# Patient Record
Sex: Female | Born: 1935
Health system: Southern US, Community
[De-identification: ages and names within clinical notes are randomized; demographics above are authoritative.]

## PROBLEM LIST (undated history)

## (undated) DIAGNOSIS — T7840XA Allergy, unspecified, initial encounter: Secondary | ICD-10-CM

## (undated) DIAGNOSIS — H9190 Unspecified hearing loss, unspecified ear: Secondary | ICD-10-CM

## (undated) DIAGNOSIS — C4491 Basal cell carcinoma of skin, unspecified: Secondary | ICD-10-CM

## (undated) DIAGNOSIS — H40009 Preglaucoma, unspecified, unspecified eye: Secondary | ICD-10-CM

## (undated) DIAGNOSIS — K219 Gastro-esophageal reflux disease without esophagitis: Secondary | ICD-10-CM

## (undated) DIAGNOSIS — M1990 Unspecified osteoarthritis, unspecified site: Secondary | ICD-10-CM

## (undated) HISTORY — DX: Preglaucoma, unspecified, unspecified eye: H40.009

## (undated) HISTORY — DX: Basal cell carcinoma of skin, unspecified: C44.91

## (undated) HISTORY — DX: Allergy, unspecified, initial encounter: T78.40XA

## (undated) HISTORY — DX: Gastro-esophageal reflux disease without esophagitis: K21.9

## (undated) HISTORY — PX: BREAST EXCISIONAL BIOPSY: SUR124

## (undated) HISTORY — PX: TUBAL LIGATION: SHX77

## (undated) HISTORY — PX: ABDOMINAL HYSTERECTOMY: SHX81

---

## 1972-07-24 HISTORY — PX: INNER EAR SURGERY: SHX679

## 1999-12-27 ENCOUNTER — Other Ambulatory Visit: Admission: RE | Admit: 1999-12-27 | Discharge: 1999-12-27 | Payer: Self-pay | Admitting: Internal Medicine

## 2005-07-24 HISTORY — PX: OTHER SURGICAL HISTORY: SHX169

## 2006-03-27 ENCOUNTER — Ambulatory Visit: Payer: Self-pay | Admitting: Internal Medicine

## 2006-04-02 ENCOUNTER — Ambulatory Visit: Payer: Self-pay | Admitting: Internal Medicine

## 2006-04-24 ENCOUNTER — Ambulatory Visit: Payer: Self-pay | Admitting: Internal Medicine

## 2006-05-10 ENCOUNTER — Ambulatory Visit: Payer: Self-pay | Admitting: Internal Medicine

## 2006-09-18 ENCOUNTER — Encounter: Admission: RE | Admit: 2006-09-18 | Discharge: 2006-09-18 | Payer: Self-pay | Admitting: Internal Medicine

## 2007-03-22 DIAGNOSIS — M810 Age-related osteoporosis without current pathological fracture: Secondary | ICD-10-CM

## 2007-03-22 DIAGNOSIS — K219 Gastro-esophageal reflux disease without esophagitis: Secondary | ICD-10-CM

## 2007-03-22 DIAGNOSIS — J309 Allergic rhinitis, unspecified: Secondary | ICD-10-CM

## 2007-04-10 ENCOUNTER — Ambulatory Visit: Payer: Self-pay | Admitting: Internal Medicine

## 2007-04-10 LAB — CONVERTED CEMR LAB
ALT: 20 units/L (ref 0–35)
AST: 28 units/L (ref 0–37)
Albumin: 4.2 g/dL (ref 3.5–5.2)
Alkaline Phosphatase: 55 units/L (ref 39–117)
BUN: 12 mg/dL (ref 6–23)
Basophils Absolute: 0 10*3/uL (ref 0.0–0.1)
Basophils Relative: 0.7 % (ref 0.0–1.0)
Bilirubin Urine: NEGATIVE
Bilirubin, Direct: 0.2 mg/dL (ref 0.0–0.3)
CO2: 32 meq/L (ref 19–32)
Calcium: 10 mg/dL (ref 8.4–10.5)
Chloride: 108 meq/L (ref 96–112)
Cholesterol: 233 mg/dL (ref 0–200)
Creatinine, Ser: 0.7 mg/dL (ref 0.4–1.2)
Direct LDL: 142.3 mg/dL
Eosinophils Absolute: 0 10*3/uL (ref 0.0–0.6)
Eosinophils Relative: 0.7 % (ref 0.0–5.0)
GFR calc Af Amer: 107 mL/min
GFR calc non Af Amer: 88 mL/min
Glucose, Bld: 98 mg/dL (ref 70–99)
Glucose, Urine, Semiquant: NEGATIVE
HCT: 38.8 % (ref 36.0–46.0)
HDL: 61.7 mg/dL (ref >39.0)
Hemoglobin: 13.6 g/dL (ref 12.0–15.0)
Ketones, urine, test strip: NEGATIVE
Lymphocytes Relative: 41.7 % (ref 12.0–46.0)
MCHC: 35 g/dL (ref 30.0–36.0)
MCV: 93.7 fL (ref 78.0–100.0)
Monocytes Absolute: 0.3 10*3/uL (ref 0.2–0.7)
Monocytes Relative: 7.1 % (ref 3.0–11.0)
Neutro Abs: 2.6 10*3/uL (ref 1.4–7.7)
Neutrophils Relative %: 49.8 % (ref 43.0–77.0)
Nitrite: NEGATIVE
Platelets: 211 10*3/uL (ref 150–400)
Potassium: 5.1 meq/L (ref 3.5–5.1)
Protein, U semiquant: NEGATIVE
RBC: 4.14 M/uL (ref 3.87–5.11)
RDW: 12.5 % (ref 11.5–14.6)
Sodium: 145 meq/L (ref 135–145)
Specific Gravity, Urine: 1.02
TSH: 3.69 microintl units/mL (ref 0.35–5.50)
Total Bilirubin: 1 mg/dL (ref 0.3–1.2)
Total CHOL/HDL Ratio: 3.8
Total Protein: 6.9 g/dL (ref 6.0–8.3)
Triglycerides: 96 mg/dL (ref 0–149)
Urobilinogen, UA: NEGATIVE
VLDL: 19 mg/dL (ref 0–40)
WBC Urine, dipstick: NEGATIVE
WBC: 4.9 10*3/uL (ref 4.5–10.5)
pH: 6.5

## 2007-04-17 ENCOUNTER — Other Ambulatory Visit: Admission: RE | Admit: 2007-04-17 | Discharge: 2007-04-17 | Payer: Self-pay | Admitting: Internal Medicine

## 2007-04-17 ENCOUNTER — Encounter: Payer: Self-pay | Admitting: Internal Medicine

## 2007-04-17 ENCOUNTER — Ambulatory Visit: Payer: Self-pay | Admitting: Internal Medicine

## 2007-04-17 DIAGNOSIS — L259 Unspecified contact dermatitis, unspecified cause: Secondary | ICD-10-CM

## 2007-05-03 ENCOUNTER — Encounter: Payer: Self-pay | Admitting: Internal Medicine

## 2007-09-25 ENCOUNTER — Encounter: Admission: RE | Admit: 2007-09-25 | Discharge: 2007-09-25 | Payer: Self-pay | Admitting: Internal Medicine

## 2008-04-16 ENCOUNTER — Encounter: Payer: Self-pay | Admitting: Internal Medicine

## 2008-04-17 ENCOUNTER — Ambulatory Visit: Payer: Self-pay | Admitting: Internal Medicine

## 2008-04-17 LAB — CONVERTED CEMR LAB
Glucose, Urine, Semiquant: NEGATIVE
Ketones, urine, test strip: NEGATIVE
Nitrite: NEGATIVE
Specific Gravity, Urine: 1.02
pH: 7

## 2008-04-22 LAB — CONVERTED CEMR LAB
Albumin: 4 g/dL (ref 3.5–5.2)
BUN: 17 mg/dL (ref 6–23)
Basophils Relative: 0.1 % (ref 0.0–3.0)
Creatinine, Ser: 0.7 mg/dL (ref 0.4–1.2)
Direct LDL: 151 mg/dL
Eosinophils Absolute: 0 10*3/uL (ref 0.0–0.7)
Eosinophils Relative: 1 % (ref 0.0–5.0)
GFR calc Af Amer: 107 mL/min
GFR calc non Af Amer: 88 mL/min
HCT: 37.6 % (ref 36.0–46.0)
MCHC: 34.5 g/dL (ref 30.0–36.0)
MCV: 93.9 fL (ref 78.0–100.0)
Monocytes Absolute: 0.4 10*3/uL (ref 0.1–1.0)
Platelets: 223 10*3/uL (ref 150–400)
RBC: 4 M/uL (ref 3.87–5.11)
TSH: 2.25 microintl units/mL (ref 0.35–5.50)
Triglycerides: 69 mg/dL (ref 0–149)
WBC: 4.1 10*3/uL — ABNORMAL LOW (ref 4.5–10.5)

## 2008-09-29 ENCOUNTER — Encounter: Admission: RE | Admit: 2008-09-29 | Discharge: 2008-09-29 | Payer: Self-pay | Admitting: Internal Medicine

## 2008-10-31 ENCOUNTER — Emergency Department (HOSPITAL_BASED_OUTPATIENT_CLINIC_OR_DEPARTMENT_OTHER): Admission: EM | Admit: 2008-10-31 | Discharge: 2008-11-01 | Payer: Self-pay | Admitting: Emergency Medicine

## 2009-03-08 ENCOUNTER — Ambulatory Visit: Payer: Self-pay | Admitting: Internal Medicine

## 2009-03-08 ENCOUNTER — Telehealth (INDEPENDENT_AMBULATORY_CARE_PROVIDER_SITE_OTHER): Payer: Self-pay | Admitting: *Deleted

## 2009-04-19 ENCOUNTER — Ambulatory Visit: Payer: Self-pay | Admitting: Internal Medicine

## 2009-04-19 LAB — CONVERTED CEMR LAB
Albumin: 3.8 g/dL (ref 3.5–5.2)
Alkaline Phosphatase: 48 units/L (ref 39–117)
Basophils Absolute: 0 10*3/uL (ref 0.0–0.1)
CO2: 29 meq/L (ref 19–32)
Calcium: 9.1 mg/dL (ref 8.4–10.5)
Cholesterol: 233 mg/dL — ABNORMAL HIGH (ref 0–200)
Creatinine, Ser: 0.6 mg/dL (ref 0.4–1.2)
Direct LDL: 155.5 mg/dL
Eosinophils Absolute: 0 10*3/uL (ref 0.0–0.7)
Glucose, Bld: 93 mg/dL (ref 70–99)
Hemoglobin: 12.9 g/dL (ref 12.0–15.0)
Lymphocytes Relative: 30.7 % (ref 12.0–46.0)
MCHC: 34.7 g/dL (ref 30.0–36.0)
Neutro Abs: 2.7 10*3/uL (ref 1.4–7.7)
RDW: 12.8 % (ref 11.5–14.6)
Sodium: 142 meq/L (ref 135–145)
TSH: 1.6 microintl units/mL (ref 0.35–5.50)
Triglycerides: 83 mg/dL (ref 0.0–149.0)

## 2009-04-20 LAB — CONVERTED CEMR LAB: Vit D, 25-Hydroxy: 34 ng/mL (ref 30–89)

## 2009-05-27 ENCOUNTER — Telehealth: Payer: Self-pay | Admitting: Internal Medicine

## 2009-06-08 ENCOUNTER — Telehealth: Payer: Self-pay | Admitting: Internal Medicine

## 2009-09-30 ENCOUNTER — Encounter: Admission: RE | Admit: 2009-09-30 | Discharge: 2009-09-30 | Payer: Self-pay | Admitting: Internal Medicine

## 2009-09-30 LAB — HM MAMMOGRAPHY: HM Mammogram: NORMAL

## 2010-04-20 ENCOUNTER — Ambulatory Visit: Payer: Self-pay | Admitting: Internal Medicine

## 2010-04-20 ENCOUNTER — Encounter: Payer: Self-pay | Admitting: Internal Medicine

## 2010-04-20 LAB — CONVERTED CEMR LAB
BUN: 17 mg/dL (ref 6–23)
Bilirubin, Direct: 0.1 mg/dL (ref 0.0–0.3)
CO2: 30 meq/L (ref 19–32)
Chloride: 103 meq/L (ref 96–112)
Cholesterol: 255 mg/dL — ABNORMAL HIGH (ref 0–200)
Creatinine, Ser: 0.7 mg/dL (ref 0.4–1.2)
Direct LDL: 165.7 mg/dL
Eosinophils Absolute: 0 10*3/uL (ref 0.0–0.7)
Glucose, Bld: 95 mg/dL (ref 70–99)
Lymphs Abs: 1.6 10*3/uL (ref 0.7–4.0)
MCHC: 34.2 g/dL (ref 30.0–36.0)
MCV: 93.5 fL (ref 78.0–100.0)
Monocytes Absolute: 0.3 10*3/uL (ref 0.1–1.0)
Neutrophils Relative %: 62.2 % (ref 43.0–77.0)
Platelets: 211 10*3/uL (ref 150.0–400.0)
RDW: 13.5 % (ref 11.5–14.6)
TSH: 2.04 microintl units/mL (ref 0.35–5.50)
Total Bilirubin: 0.9 mg/dL (ref 0.3–1.2)
Total Protein: 7.3 g/dL (ref 6.0–8.3)
VLDL: 21.2 mg/dL (ref 0.0–40.0)

## 2010-06-30 ENCOUNTER — Ambulatory Visit: Payer: Self-pay | Admitting: Internal Medicine

## 2010-06-30 DIAGNOSIS — M549 Dorsalgia, unspecified: Secondary | ICD-10-CM

## 2010-08-13 ENCOUNTER — Other Ambulatory Visit: Payer: Self-pay | Admitting: Internal Medicine

## 2010-08-13 DIAGNOSIS — Z1239 Encounter for other screening for malignant neoplasm of breast: Secondary | ICD-10-CM

## 2010-08-25 NOTE — Assessment & Plan Note (Signed)
Summary: pulled muscle in back/cjr   Vital Signs:  Patient profile:   75 year old female Weight:      121 pounds Temp:     98.2 degrees F oral BP sitting:   118 / 74  (left arm) Cuff size:   regular  Vitals Entered By: Duard Brady LPN (June 30, 2010 10:31 AM) heCC: c/o ??pulled muscle in (R) lower back Is Patient Diabetic? No   CC:  c/o ??pulled muscle in (R) lower back.  History of Present Illness:   75 year old patient who presents with a several week history of intermittent right lumbar pain.  Her the week.  She had much more severe pain, associated with the intense muscle spasm.  She has been using Aleve and her husband's muscle relaxant.  No radicular component.  She does have a history of osteoporosis.  No history of arthritis.  She has been doing more work with the [employer] with more bending and stooping of late  Allergies: 1)  ! Penicillin G Pot in Dextrose (Penicillin G Potassium in D5w) 2)  ! Sulf-10  Physical Exam  General:  Well-developed,well-nourished,in no acute distress; alert,appropriate and cooperative throughout examination Msk:  negative straight leg test no tenderness or obvious spasm in the lumbar region range of motion of the hips were intact without discomfort   Impression & Recommendations:  Problem # 1:  BACK PAIN (ICD-724.5)  Her updated medication list for this problem includes:    Methocarbamol 500 Mg Tabs (Methocarbamol) ..... One every 6 hours as needed for muscle spasm  Her updated medication list for this problem includes:    Methocarbamol 500 Mg Tabs (Methocarbamol) ..... One every 6 hours as needed for muscle spasm  Complete Medication List: 1)  Boniva 150 Mg Tabs (Ibandronate sodium) .Marland Kitchen.. 1 q month 2)  Ambien 5 Mg Tabs (Zolpidem tartrate) .Marland Kitchen.. 1 as needed 3)  Omeprazole 20 Mg Cpdr (Omeprazole) .Marland Kitchen.. 1 once daily 4)  Fexofenadine Hcl 180 Mg Tabs (Fexofenadine hcl) .Marland Kitchen.. 1 once daily as needed 5)  Centrum Silver  Tabs (Multiple vitamins-minerals) .... Qd 6)  Calcium-vitamin D 600-200 Mg-unit Tabs (Calcium-vitamin d) .... Qd 7)  Fish Oil 1000 Mg Caps (Omega-3 fatty acids) .... Qd 8)  Vitamin D 1000 Unit Tabs (Cholecalciferol) .... Qd 9)  Methocarbamol 500 Mg Tabs (Methocarbamol) .... One every 6 hours as needed for muscle spasm  Patient Instructions: 1)  Most patients (90%) with low back pain will improve with time (2-6 weeks). Keep active but avoid activities that are painful. Apply moist heat  to lower back several times a day. 2)  Take 650-1000mg  of Tylenol every 4-6 hours as needed for relief of pain or comfort of fever AVOID taking more than 3000mg   in a 24 hour period (can cause liver damage in higher doses). Prescriptions: METHOCARBAMOL 500 MG TABS (METHOCARBAMOL) one every 6 hours as needed for muscle spasm  #50 x 1   Entered and Authorized by:   Gordy Savers  MD   Signed by:   Gordy Savers  MD on 06/30/2010   Method used:   Print then Give to Patient   RxID:   [id number]    Orders Added: 1)  Est. Patient Level III [57846]  Appended Document: pulled muscle in back/cjr     Allergies: 1)  ! Penicillin G Pot in Dextrose (Penicillin G Potassium in D5w) 2)  ! Sulf-10   Complete Medication List: 1)  Boniva 150 Mg Tabs (Ibandronate sodium) .Marland KitchenMarland KitchenMarland Kitchen  1 q month 2)  Ambien 5 Mg Tabs (Zolpidem tartrate) .Marland Kitchen.. 1 as needed 3)  Omeprazole 20 Mg Cpdr (Omeprazole) .Marland Kitchen.. 1 once daily 4)  Fexofenadine Hcl 180 Mg Tabs (Fexofenadine hcl) .Marland Kitchen.. 1 once daily as needed 5)  Centrum Silver Tabs (Multiple vitamins-minerals) .... Qd 6)  Calcium-vitamin D 600-200 Mg-unit Tabs (Calcium-vitamin d) .... Qd 7)  Fish Oil 1000 Mg Caps (Omega-3 fatty acids) .... Qd 8)  Vitamin D 1000 Unit Tabs (Cholecalciferol) .... Qd 9)  Methocarbamol 500 Mg Tabs (Methocarbamol) .... One every 6 hours as needed for muscle spasm  Other Orders: Depo- Medrol 80mg  (J1040) Admin of Therapeutic Inj  intramuscular or  subcutaneous (56213)   Medication Administration  Injection # 1:    Medication: Depo- Medrol 80mg     Diagnosis: BACK PAIN (ICD-724.5)    Route: IM    Site: RUOQ gluteus    Exp Date: 10/2010    Lot #: [id number]    Mfr: Pharmacia    Patient tolerated injection without complications    Given by: Duard Brady LPN (June 30, 2010 12:56 PM)  Orders Added: 1)  Depo- Medrol 80mg  [J1040] 2)  Admin of Therapeutic Inj  intramuscular or subcutaneous [08657]

## 2010-08-25 NOTE — Assessment & Plan Note (Signed)
Summary: EMP-WILL FAST//CCM   Vital Signs:  Patient profile:   75 year old female Height:      63 inches Weight:      118 pounds BMI:     20.98 Temp:     98.0 degrees F oral BP sitting:   110 / 74  (right arm) Cuff size:   regular  Vitals Entered By: Duard Brady LPN (April 20, 2010 8:31 AM) CC: cpx - doing well  Is Patient Diabetic? No   CC:  cpx - doing well .  History of Present Illness: 75 year old patient who is seen today for a comprehensive evaluation.  She has a history of osteoporosis, gastroesophageal reflux disease, and allergic rhinitis  Here for Medicare AWV:  1.   Risk factors based on Past M, S, F history:  no cardiovascular risk factors 2.   Physical Activities: remains quite active physically, including a health club 3 times per week 3.   Depression/mood: history depression, or mood disorder 4.   Hearing: no hearing deficits 5.   ADL's: independent in all aspects of daily living 6.   Fall Risk: low 7.   Home Safety: no problems identified 8.   Height, weight, &visual acuity:height and weight stable.  No difficulty with visual acuity 9               Counseling- heart healthy diet regular.  Exercise regimen encouraged as well as calcium and vitamin D supplementation 10.   Labs ordered based on risk factors: complete laboratory profile including lipid panel will be reviewed 11.           Referral Coordination- will have mammogram performed in the spring 12.           Care Plan-  continue heart healthy diet regular.  Exercise.  Plan 13.            Cognitive Assessment- alert and oriented, with normal affect.  No memory disturbance, and Loz[place], executive functioning without difficulty   Allergies: 1)  ! Penicillin G Pot in Dextrose (Penicillin G Potassium in D5w) 2)  ! Sulf-10  Past History:  Past Medical History: Reviewed history from 03/22/2007 and no changes required. Allergic rhinitis GERD Osteoporosis  Past Surgical History: Reviewed  history from 04/19/2009 and no changes required. Hysterectomy Tubal ligation colonoscopy  2007  Family History: Reviewed history from 04/19/2009 and no changes required. both parents died at age 56.  Father from  sepsis.   Mother of complications of heart failure one brother one sister both have hypertension.   Sister on chronic dialysis, SLE, CAD, Asthma  Social History: Reviewed history from 04/19/2009 and no changes required. Married Never Smoked Regular exercise-yes  Review of Systems  The patient denies anorexia, fever, weight loss, weight gain, vision loss, decreased hearing, hoarseness, chest pain, syncope, dyspnea on exertion, peripheral edema, prolonged cough, headaches, hemoptysis, abdominal pain, melena, hematochezia, severe indigestion/heartburn, hematuria, incontinence, genital sores, muscle weakness, suspicious skin lesions, transient blindness, difficulty walking, depression, unusual weight change, abnormal bleeding, enlarged lymph nodes, angioedema, and breast masses.    Physical Exam  General:  Well-developed,well-nourished,in no acute distress; alert,appropriate and cooperative throughout examination Head:  Normocephalic and atraumatic without obvious abnormalities. No apparent alopecia or balding. Eyes:  No corneal or conjunctival inflammation noted. EOMI. Perrla. Funduscopic exam benign, without hemorrhages, exudates or papilledema. Vision grossly normal. Ears:  External ear exam shows no significant lesions or deformities.  Otoscopic examination reveals clear canals, tympanic membranes are intact bilaterally without bulging, retraction,  inflammation or discharge. Hearing is grossly normal bilaterally. Nose:  External nasal examination shows no deformity or inflammation. Nasal mucosa are pink and moist without lesions or exudates. Mouth:  Oral mucosa and oropharynx without lesions or exudates.  Teeth in good repair. Neck:  No deformities, masses, or tenderness  noted. Chest Wall:  No deformities, masses, or tenderness noted. Breasts:  No mass, nodules, thickening, tenderness, bulging, retraction, inflamation, nipple discharge or skin changes noted.   Lungs:  Normal respiratory effort, chest expands symmetrically. Lungs are clear to auscultation, no crackles or wheezes. Heart:  Normal rate and regular rhythm. S1 and S2 normal without gallop, murmur, click, rub or other extra sounds. Abdomen:  Bowel sounds positive,abdomen soft and non-tender without masses, organomegaly or hernias noted. Rectal:  No external abnormalities noted. Normal sphincter tone. No rectal masses or tenderness. Genitalia:  Normal introitus for age, no external lesions, no vaginal discharge, mucosa pink and moist, no vaginal or cervical lesions, no vaginal atrophy, no friaility or hemorrhage, normal uterus size and position, no adnexal masses or tenderness Msk:  No deformity or scoliosis noted of thoracic or lumbar spine.   Pulses:  R and L carotid,radial,femoral,dorsalis pedis and posterior tibial pulses are full and equal bilaterally Extremities:  No clubbing, cyanosis, edema, or deformity noted with normal full range of motion of all joints.   Neurologic:  No cranial nerve deficits noted. Station and gait are normal. Plantar reflexes are down-going bilaterally. DTRs are symmetrical throughout. Sensory, motor and coordinative functions appear intact. Skin:  Intact without suspicious lesions or rashes Cervical Nodes:  No lymphadenopathy noted Axillary Nodes:  No palpable lymphadenopathy Inguinal Nodes:  No significant adenopathy Psych:  Cognition and judgment appear intact. Alert and cooperative with normal attention span and concentration. No apparent delusions, illusions, hallucinations   Impression & Recommendations:  Problem # 1:  PREVENTIVE HEALTH CARE (ICD-V70.0)  Orders: EKG w/ Interpretation (93000) Medicare -1st Annual Wellness Visit [phone]) Venipuncture  (60454) TLB-Lipid Panel (80061-LIPID) TLB-BMP (Basic Metabolic Panel-BMET) (80048-METABOL) TLB-CBC Platelet - w/Differential (85025-CBCD) TLB-Hepatic/Liver Function Pnl (80076-HEPATIC) TLB-TSH (Thyroid Stimulating Hormone) (84443-TSH) Specimen Handling (09811)  Problem # 2:  OSTEOPOROSIS (ICD-733.00)  Her updated medication list for this problem includes:    Boniva 150 Mg Tabs (Ibandronate sodium) .Marland Kitchen... 1 q month  Orders: Venipuncture (91478) TLB-Lipid Panel (80061-LIPID) TLB-BMP (Basic Metabolic Panel-BMET) (80048-METABOL) TLB-CBC Platelet - w/Differential (85025-CBCD) TLB-Hepatic/Liver Function Pnl (80076-HEPATIC) TLB-TSH (Thyroid Stimulating Hormone) (84443-TSH)  Problem # 3:  GERD (ICD-530.81)  Her updated medication list for this problem includes:    Omeprazole 20 Mg Cpdr (Omeprazole) .Marland Kitchen... 1 once daily  Orders: Venipuncture (29562) TLB-Lipid Panel (80061-LIPID) TLB-BMP (Basic Metabolic Panel-BMET) (80048-METABOL) TLB-CBC Platelet - w/Differential (85025-CBCD) TLB-Hepatic/Liver Function Pnl (80076-HEPATIC) TLB-TSH (Thyroid Stimulating Hormone) (84443-TSH)  Complete Medication List: 1)  Boniva 150 Mg Tabs (Ibandronate sodium) .Marland Kitchen.. 1 q month 2)  Ambien 5 Mg Tabs (Zolpidem tartrate) .Marland Kitchen.. 1 as needed 3)  Omeprazole 20 Mg Cpdr (Omeprazole) .Marland Kitchen.. 1 once daily 4)  Fexofenadine Hcl 180 Mg Tabs (Fexofenadine hcl) .Marland Kitchen.. 1 once daily as needed 5)  Centrum Silver Tabs (Multiple vitamins-minerals) .... Qd 6)  Calcium-vitamin D 600-200 Mg-unit Tabs (Calcium-vitamin d) .... Qd 7)  Fish Oil 1000 Mg Caps (Omega-3 fatty acids) .... Qd 8)  Vitamin D 1000 Unit Tabs (Cholecalciferol) .... Qd  Patient Instructions: 1)  Please schedule a follow-up appointment in 1 year. 2)  Limit your Sodium (Salt). 3)  It is important that you exercise regularly at least 20 minutes 5 times  a week. If you develop chest pain, have severe difficulty breathing, or feel very tired , stop exercising immediately  and seek medical attention. 4)  Schedule your mammogram. 5)  Take calcium +Vitamin D daily. Prescriptions: AMBIEN 5 MG  TABS (ZOLPIDEM TARTRATE) 1 as needed  #90 x 4   Entered and Authorized by:   Gordy Savers  MD   Signed by:   Gordy Savers  MD on 04/20/2010   Method used:   Print then Give to Patient   RxID:   [id number] FEXOFENADINE HCL 180 MG TABS (FEXOFENADINE HCL) 1 once daily as needed  #90 x 6   Entered and Authorized by:   Gordy Savers  MD   Signed by:   Gordy Savers  MD on 04/20/2010   Method used:   [facility]        [facility] [phone]* (retail)       [address]       [place], [place]  [address]       Ph: [phone]       Fax: [phone]   RxID:   [id number] OMEPRAZOLE 20 MG CPDR (OMEPRAZOLE) 1 once daily  #90 Capsule x 3   Entered and Authorized by:   Gordy Savers  MD   Signed by:   Gordy Savers  MD on 04/20/2010   Method used:   [facility] to        [facility] [phone][facility] (retail)       [address]       [place], [place]  [address]       Ph: [phone]       Fax: [phone]   RxID:   [id number] BONIVA 150 MG  TABS (IBANDRONATE SODIUM) 1 q month  #3 x 6   Entered and Authorized by:   Gordy Savers  MD   Signed by:   Gordy Savers  MD on 04/20/2010   Method used:   [facility]ronically to        [facility] [phone]* (retail)       [address]       [place], [place]  [address]       Ph: [phone]       Fax: [phone]   RxID:   [id number]    Immunization History:  Influenza Immunization History:    Influenza:  Historical (04/05/2010)  Pneumovax Immunization History:    Pneumovax:  Pneumovax (04/19/2009)

## 2010-10-06 ENCOUNTER — Ambulatory Visit
Admission: RE | Admit: 2010-10-06 | Discharge: 2010-10-06 | Disposition: A | Discharge status: Home / Self Care | Payer: Medicare Other | Source: Ambulatory Visit | Attending: Internal Medicine | Admitting: Internal Medicine

## 2010-10-06 DIAGNOSIS — Z1239 Encounter for other screening for malignant neoplasm of breast: Secondary | ICD-10-CM

## 2010-11-01 ENCOUNTER — Other Ambulatory Visit: Payer: Self-pay | Admitting: Internal Medicine

## 2010-11-01 DIAGNOSIS — Z1231 Encounter for screening mammogram for malignant neoplasm of breast: Secondary | ICD-10-CM

## 2010-12-09 NOTE — Assessment & Plan Note (Signed)
[facility]                              [facility] OFFICE NOTE   Savannah, Jimenez                        MRN:          [id number]  DATE:04/02/2006                            DOB:          06/12/1936    The patient is a 75 year old female seen today to reestablish with our  practice.  She relocated briefly to [place] and is now back in the  [place] area.  She has a history of osteoporosis, mild gastroesophageal  reflux disease, and allergic rhinitis.  She is on diet control for mild  dyslipidemia.   ALLERGIES:  PENICILLIN, SULFA.   MEDICAL REGIMEN:  Boniva, Zyrtec p.r.n., Penlac for onychomycotic toes.   REVIEW OF SYSTEMS:  Negative.  She had a sigmoidoscopy in 2002.   SOCIAL HISTORY:  Married, 2 children, 4 grandchildren.   PHYSICAL EXAMINATION:  GENERAL:  A healthy-appearing white female, thin,  fair-completed, in no acute distress.  VITAL SIGNS:  Blood pressure is down to 124/80.  HEAD AND NECK:  Normal fundi.  Ears, nose and throat clear.  Neck:  No  bruits or adenopathy.  CHEST:  Clear.  BREASTS:  Negative.  CARDIOVASCULAR:  Normal heart sounds, no murmurs.  ABDOMEN:  Benign.  Lower midline scar.  PELVIC:  No adnexal masses.  RECTAL:  Stool heme-negative.  EXTREMITIES:  Full peripheral pulses, no edema.   IMPRESSION:  1. Osteoporosis.  2. Menopausal syndrome.  3. Mild gastroesophageal reflux disease.   DISPOSITION:  Will set her up for a full screening colonoscopy.  Laboratory  studies were reviewed.  These were unremarkable.  Will recheck in one year.  A mammogram will be obtained in February.                                   Gordy Savers, MD   PFK/MedQ  DD:  04/02/2006  DT:  04/03/2006  Job #:  [id number]

## 2011-02-20 ENCOUNTER — Encounter: Payer: Self-pay | Admitting: Internal Medicine

## 2011-02-20 ENCOUNTER — Ambulatory Visit (INDEPENDENT_AMBULATORY_CARE_PROVIDER_SITE_OTHER): Payer: Medicare Other | Admitting: Internal Medicine

## 2011-02-20 DIAGNOSIS — J309 Allergic rhinitis, unspecified: Secondary | ICD-10-CM

## 2011-02-20 DIAGNOSIS — M810 Age-related osteoporosis without current pathological fracture: Secondary | ICD-10-CM

## 2011-02-20 NOTE — Progress Notes (Signed)
  Subjective:    Patient ID: Savannah Jimenez, female    DOB: 20-Oct-1935, 75 y.o.   MRN: [id number]  HPI A 75 year old patient who is seen today for followup. 4 weeks ago she had the onset of cold symptoms with generalized achiness and cough. The cough has improved but is still of persistent and aggravating for both her and her husband. She has been using some Zyrtec without much benefit cough is nonproductive.  She also describes some low back pain. She describes some persistent fatigue. Cough is interfering somewhat with her sleep. No fever or productive cough; no chest pain Review of Systems  Constitutional: Negative.   HENT: Positive for rhinorrhea. Negative for hearing loss, congestion, sore throat, dental problem, sinus pressure and tinnitus.   Eyes: Negative for pain, discharge and visual disturbance.  Respiratory: Positive for cough. Negative for shortness of breath.   Cardiovascular: Negative for chest pain, palpitations and leg swelling.  Gastrointestinal: Negative for nausea, vomiting, abdominal pain, diarrhea, constipation, blood in stool and abdominal distention.  Genitourinary: Negative for dysuria, urgency, frequency, hematuria, flank pain, vaginal bleeding, vaginal discharge, difficulty urinating, vaginal pain and pelvic pain.  Musculoskeletal: Negative for joint swelling, arthralgias and gait problem.  Skin: Negative for rash.  Neurological: Negative for dizziness, syncope, speech difficulty, weakness, numbness and headaches.  Hematological: Negative for adenopathy.  Psychiatric/Behavioral: Negative for behavioral problems, dysphoric mood and agitation. The patient is not nervous/anxious.        Objective:   Physical Exam  Constitutional: She is oriented to person, place, and time. She appears well-developed and well-nourished.  HENT:  Head: Normocephalic.  Right Ear: External ear normal.  Left Ear: External ear normal.  Mouth/Throat: Oropharynx is clear and moist.  Eyes:  Conjunctivae and EOM are normal. Pupils are equal, round, and reactive to light.  Neck: Normal range of motion. Neck supple. No thyromegaly present.  Cardiovascular: Normal rate, regular rhythm, normal heart sounds and intact distal pulses.   Pulmonary/Chest: Effort normal and breath sounds normal.  Abdominal: Soft. Bowel sounds are normal. She exhibits no mass. There is no tenderness.  Musculoskeletal: Normal range of motion.  Lymphadenopathy:    She has no cervical adenopathy.  Neurological: She is alert and oriented to person, place, and time.  Skin: Skin is warm and dry. No rash noted.  Psychiatric: She has a normal mood and affect. Her behavior is normal.          Assessment & Plan:   No problem-specific assessment & plan notes found for this encounter.  Postviral cough. Will treat with Zutripro and samples were dispensed. She was reassured. She will call if unimproved

## 2011-02-20 NOTE — Patient Instructions (Signed)
Get plenty of rest, Drink lots of  clear liquids, and use Tylenol or ibuprofen for fever and discomfort.    His cough medication (Zutripro)  1 teaspoon every 6 hours as needed for cough

## 2011-03-03 ENCOUNTER — Telehealth: Payer: Self-pay | Admitting: Internal Medicine

## 2011-03-03 NOTE — Telephone Encounter (Signed)
Pt called 8/10. Dr Kirtland Bouchard had given her samples of cough meds - Zutripro and told her she could get a Rx if necessary. She states cough is still not quite gone and would [facility] [address]

## 2011-03-06 MED ORDER — HYDROCODONE-HOMATROPINE 5-1.5 MG/5ML PO SYRP: 5.0000 mL | ORAL_SOLUTION | Freq: Four times a day (QID) | ORAL | Status: AC | PRN

## 2011-03-06 NOTE — Telephone Encounter (Signed)
Please call patient  and inquire whether she still needs cough medicine since this call came in Friday- if she would like additional cough medicine please call him generic Hydromet 6 ounce

## 2011-03-06 NOTE — Telephone Encounter (Signed)
Spoke with pt - informed of rx for hydromet - pt still would like med [facility]

## 2011-03-28 ENCOUNTER — Encounter: Payer: Self-pay | Admitting: Internal Medicine

## 2011-03-28 ENCOUNTER — Ambulatory Visit (INDEPENDENT_AMBULATORY_CARE_PROVIDER_SITE_OTHER): Payer: Medicare Other | Admitting: Internal Medicine

## 2011-03-28 VITALS — BP 118/70 | Temp 98.4°F | Wt 116.0 lb

## 2011-03-28 DIAGNOSIS — R319 Hematuria, unspecified: Secondary | ICD-10-CM

## 2011-03-28 DIAGNOSIS — N390 Urinary tract infection, site not specified: Secondary | ICD-10-CM

## 2011-03-28 LAB — POCT URINALYSIS DIPSTICK
Nitrite, UA: NEGATIVE
pH, UA: 6.5

## 2011-03-28 MED ORDER — CIPROFLOXACIN HCL 500 MG PO TABS: 500.0000 mg | ORAL_TABLET | Freq: Two times a day (BID) | ORAL | Status: AC

## 2011-03-28 NOTE — Patient Instructions (Signed)
Drink as much fluid as you  can tolerate over the next few days  Take your antibiotic as prescribed until ALL of it is gone, but stop if you develop a rash, swelling, or any side effects of the medication.  Contact our office as soon as possible if  there are side effects of the medication.   

## 2011-03-28 NOTE — Progress Notes (Signed)
  Subjective:    Patient ID: Savannah Jimenez, female    DOB: Feb 13, 1936, 75 y.o.   MRN: [id number]  HPI  75 year old patient who is seen today for followup. Over the weekend she has noted some occasional episodes of suspected hematuria. She has had UTIs in the past but usually associated with dysuria urgency and frequency. No similar GU symptoms at this time. No flank pain or fever;  a urinalysis was reviewed and did reveal hematuria and pyuria    Review of Systems  Genitourinary: Positive for hematuria. Negative for frequency, flank pain, decreased urine volume, vaginal bleeding, vaginal discharge, difficulty urinating, vaginal pain and pelvic pain.       Objective:   Physical Exam  Constitutional: She appears well-developed and well-nourished. No distress.          Assessment & Plan:   Episodic gross hematuria with pyuria. We'll treat for suspected lower UTI. Will treat with Cipro for 7 days. If hematuria fails to respond will need urological evaluation

## 2011-05-01 ENCOUNTER — Encounter: Payer: Self-pay | Admitting: Internal Medicine

## 2011-05-01 ENCOUNTER — Ambulatory Visit (INDEPENDENT_AMBULATORY_CARE_PROVIDER_SITE_OTHER): Payer: Medicare Other | Admitting: Internal Medicine

## 2011-05-01 VITALS — BP 108/74 | Temp 98.9°F | Wt 114.0 lb

## 2011-05-01 DIAGNOSIS — M2550 Pain in unspecified joint: Secondary | ICD-10-CM

## 2011-05-01 DIAGNOSIS — M549 Dorsalgia, unspecified: Secondary | ICD-10-CM

## 2011-05-01 DIAGNOSIS — M810 Age-related osteoporosis without current pathological fracture: Secondary | ICD-10-CM

## 2011-05-01 DIAGNOSIS — K219 Gastro-esophageal reflux disease without esophagitis: Secondary | ICD-10-CM

## 2011-05-01 LAB — TSH: TSH: 0.53 u[IU]/mL (ref 0.35–5.50)

## 2011-05-01 LAB — CBC WITH DIFFERENTIAL/PLATELET
Basophils Relative: 0.3 % (ref 0.0–3.0)
Eosinophils Relative: 0 % (ref 0.0–5.0)
HCT: 34.8 % — ABNORMAL LOW (ref 36.0–46.0)
Hemoglobin: 11.4 g/dL — ABNORMAL LOW (ref 12.0–15.0)
Lymphs Abs: 1.6 10*3/uL (ref 0.7–4.0)
Monocytes Relative: 5.9 % (ref 3.0–12.0)
Neutro Abs: 6.7 10*3/uL (ref 1.4–7.7)
Platelets: 398 10*3/uL (ref 150.0–400.0)
RBC: 3.92 Mil/uL (ref 3.87–5.11)
WBC: 8.9 10*3/uL (ref 4.5–10.5)

## 2011-05-01 LAB — COMPREHENSIVE METABOLIC PANEL
AST: 19 U/L (ref 0–37)
Alkaline Phosphatase: 75 U/L (ref 39–117)
BUN: 18 mg/dL (ref 6–23)
Creatinine, Ser: 0.6 mg/dL (ref 0.4–1.2)
Total Bilirubin: 0.6 mg/dL (ref 0.3–1.2)

## 2011-05-01 LAB — SEDIMENTATION RATE: Sed Rate: 108 mm/hr — ABNORMAL HIGH (ref 0–22)

## 2011-05-01 LAB — CK: Total CK: 19 U/L (ref 7–177)

## 2011-05-01 MED ORDER — MELOXICAM 15 MG PO TABS: 15.0000 mg | ORAL_TABLET | Freq: Every day | ORAL | Status: DC

## 2011-05-01 NOTE — Patient Instructions (Signed)
Return office visit in 10 days  Nexium 1 daily  Avoids foods high in acid such as tomatoes citrus juices, and spicy foods.  Avoid eating within two hours of lying down or before exercising.  Do not overheat.  Try smaller more frequent meals.  If symptoms persist, elevate the head of her bed 12 inches while sleeping.

## 2011-05-01 NOTE — Progress Notes (Signed)
  Subjective:    Patient ID: Savannah Jimenez, female    DOB: [birth year], 75 y.o.   MRN: [id number]  HPI  75 year old patient who is seen today with a number of concerns. Her chief complaint is bilateral upper leg pain this is aggravated by standing and is also bothersome at night. She does have some chronic low back pain which is aggravated by movement and position changes while sleeping. She does have a history of low back pain. She has osteoporosis treated with Boniva. She also complains some mild stiff neck a choking sensation with swallowing as well as some insomnia. It seems the insomnia is aggravated by back and leg pain she was seen last month and treated for a UTI. The leg pain seems to be in the anterior thigh area primarily.    Review of Systems  Constitutional: Negative.   HENT: Negative for hearing loss, congestion, sore throat, rhinorrhea, dental problem, sinus pressure and tinnitus.   Eyes: Negative for pain, discharge and visual disturbance.  Respiratory: Negative for cough and shortness of breath.   Cardiovascular: Negative for chest pain, palpitations and leg swelling.  Gastrointestinal: Negative for nausea, vomiting, abdominal pain, diarrhea, constipation, blood in stool and abdominal distention.  Genitourinary: Negative for dysuria, urgency, frequency, hematuria, flank pain, vaginal bleeding, vaginal discharge, difficulty urinating, vaginal pain and pelvic pain.  Musculoskeletal: Positive for back pain. Negative for joint swelling, arthralgias and gait problem.  Skin: Negative for rash.  Neurological: Negative for dizziness, syncope, speech difficulty, weakness, numbness and headaches.  Hematological: Negative for adenopathy.  Psychiatric/Behavioral: Negative for behavioral problems, dysphoric mood and agitation. The patient is not nervous/anxious.        Objective:   Physical Exam  Constitutional: She is oriented to person, place, and time. She appears well-developed and  well-nourished.  HENT:  Head: Normocephalic.  Right Ear: External ear normal.  Left Ear: External ear normal.  Mouth/Throat: Oropharynx is clear and moist.  Eyes: Conjunctivae and EOM are normal. Pupils are equal, round, and reactive to light.  Neck: Normal range of motion. Neck supple. No thyromegaly present.  Cardiovascular: Normal rate, regular rhythm, normal heart sounds and intact distal pulses.   Pulmonary/Chest: Effort normal and breath sounds normal.  Abdominal: Soft. Bowel sounds are normal. She exhibits no mass. There is no tenderness.  Musculoskeletal: Normal range of motion.  Lymphadenopathy:    She has no cervical adenopathy.  Neurological: She is alert and oriented to person, place, and time.  Skin: Skin is warm and dry. No rash noted.  Psychiatric: She has a normal mood and affect. Her behavior is normal.          Assessment & Plan:   Back and leg pain. We'll treat symptomatically and observe. Gastroesophageal reflux disease with increased choking sensation. Will treat with short-term AcipHex and recheck in 1 month. May need upper endoscopy if still symptomatic.  We will check back and leg pain in 1 month or as needed.

## 2011-05-02 ENCOUNTER — Other Ambulatory Visit: Payer: Self-pay | Admitting: Internal Medicine

## 2011-05-02 MED ORDER — PREDNISONE 10 MG PO TABS: ORAL_TABLET | ORAL | Status: DC

## 2011-05-02 NOTE — Progress Notes (Signed)
Quick Note:  spke with tp t- informed og rx to stop and new to start - appt in 2 wks. KIK ______

## 2011-05-10 ENCOUNTER — Ambulatory Visit: Payer: Medicare Other | Admitting: Internal Medicine

## 2011-05-17 ENCOUNTER — Encounter: Payer: Self-pay | Admitting: Internal Medicine

## 2011-05-17 ENCOUNTER — Ambulatory Visit (INDEPENDENT_AMBULATORY_CARE_PROVIDER_SITE_OTHER): Payer: Medicare Other | Admitting: Internal Medicine

## 2011-05-17 VITALS — BP 110/70 | Temp 98.3°F | Wt 111.0 lb

## 2011-05-17 DIAGNOSIS — M353 Polymyalgia rheumatica: Secondary | ICD-10-CM

## 2011-05-17 NOTE — Patient Instructions (Signed)
Decrease prednisone to 5 mg daily

## 2011-05-18 ENCOUNTER — Encounter: Payer: Self-pay | Admitting: Internal Medicine

## 2011-05-18 DIAGNOSIS — M353 Polymyalgia rheumatica: Secondary | ICD-10-CM | POA: Insufficient documentation

## 2011-05-18 NOTE — Progress Notes (Signed)
  Subjective:    Patient ID: Savannah Jimenez, female    DOB: Apr 27, 1936, 75 y.o.   MRN: [id number]  HPI 75 year old patient who is seen today in followup. She was seen approximately 2 weeks ago complaining of severe leg pain and weakness as well as neck pain. She complained of generalized malaise and a sense of unwellness. Sedimentation rate was in excess of 100. She responded promptly within 24 hours to low-dose prednisone therapy today she feels quite well. She presently is on 10 mg of prednisone daily   Review of Systems  Constitutional: Negative.   HENT: Negative for hearing loss, congestion, sore throat, rhinorrhea, dental problem, sinus pressure and tinnitus.   Eyes: Negative for pain, discharge and visual disturbance.  Respiratory: Negative for cough and shortness of breath.   Cardiovascular: Negative for chest pain, palpitations and leg swelling.  Gastrointestinal: Negative for nausea, vomiting, abdominal pain, diarrhea, constipation, blood in stool and abdominal distention.  Genitourinary: Negative for dysuria, urgency, frequency, hematuria, flank pain, vaginal bleeding, vaginal discharge, difficulty urinating, vaginal pain and pelvic pain.  Musculoskeletal: Negative for joint swelling, arthralgias and gait problem.  Skin: Negative for rash.  Neurological: Negative for dizziness, syncope, speech difficulty, weakness, numbness and headaches.  Hematological: Negative for adenopathy.  Psychiatric/Behavioral: Negative for behavioral problems, dysphoric mood and agitation. The patient is not nervous/anxious.        Objective:   Physical Exam  Constitutional: She appears well-developed and well-nourished. No distress.          Assessment & Plan:   Polymyalgia rheumatica. Prompt and complete clinical response to low-dose prednisone. We'll decrease prednisone to 5 mg daily. She is scheduled for a physical in about one month we'll check a sedimentation rate at that time may consider a  further taper

## 2011-05-22 ENCOUNTER — Encounter: Payer: Self-pay | Admitting: Internal Medicine

## 2011-05-31 ENCOUNTER — Encounter: Payer: Self-pay | Admitting: Internal Medicine

## 2011-05-31 ENCOUNTER — Ambulatory Visit (INDEPENDENT_AMBULATORY_CARE_PROVIDER_SITE_OTHER): Payer: Medicare Other | Admitting: Internal Medicine

## 2011-05-31 VITALS — BP 124/80 | HR 90 | Temp 98.0°F | Resp 16 | Ht 63.0 in | Wt 111.0 lb

## 2011-05-31 DIAGNOSIS — M353 Polymyalgia rheumatica: Secondary | ICD-10-CM

## 2011-05-31 DIAGNOSIS — Z0000 Encounter for general adult medical examination without abnormal findings: Secondary | ICD-10-CM

## 2011-05-31 DIAGNOSIS — M549 Dorsalgia, unspecified: Secondary | ICD-10-CM

## 2011-05-31 DIAGNOSIS — K219 Gastro-esophageal reflux disease without esophagitis: Secondary | ICD-10-CM

## 2011-05-31 DIAGNOSIS — Z136 Encounter for screening for cardiovascular disorders: Secondary | ICD-10-CM

## 2011-05-31 DIAGNOSIS — M810 Age-related osteoporosis without current pathological fracture: Secondary | ICD-10-CM

## 2011-05-31 DIAGNOSIS — E785 Hyperlipidemia, unspecified: Secondary | ICD-10-CM

## 2011-05-31 LAB — SEDIMENTATION RATE: Sed Rate: 48 mm/hr — ABNORMAL HIGH (ref 0–22)

## 2011-05-31 LAB — LIPID PANEL
Cholesterol: 229 mg/dL — ABNORMAL HIGH (ref 0–200)
Triglycerides: 108 mg/dL (ref 0.0–149.0)

## 2011-05-31 MED ORDER — ZOLPIDEM TARTRATE 5 MG PO TABS: 5.0000 mg | ORAL_TABLET | Freq: Every evening | ORAL | Status: DC | PRN

## 2011-05-31 MED ORDER — OMEPRAZOLE 20 MG PO CPDR: 20.0000 mg | DELAYED_RELEASE_CAPSULE | Freq: Every day | ORAL | Status: DC

## 2011-05-31 MED ORDER — PREDNISONE 5 MG PO TABS: ORAL_TABLET | ORAL | Status: DC

## 2011-05-31 NOTE — Patient Instructions (Signed)
It is important that you exercise regularly, at least 20 minutes 3 to 4 times per week.  If you develop chest pain or shortness of breath seek  medical attention.  Take a calcium supplement, plus 800-1200 units of vitamin D  Return in 3 months for follow-up  

## 2011-05-31 NOTE — Progress Notes (Signed)
Subjective:    Patient ID: Savannah Jimenez, female    DOB: 23-Nov-1935, 75 y.o.   MRN: [id number]  HPI CC: cpx - doing well .   History of Present Illness:   75 year-old patient who is seen today for a comprehensive evaluation. She has a history of osteoporosis, gastroesophageal reflux disease, and allergic rhinitis. She has recently been diagnosed with POLYmyalgia rheumatica and Had a prednisone reduction from 10 to 5  mg one month ago. She has had some mild breakthrough symptoms with some neck and thigh discomfort. She states her symptoms are manageable but bothersome. She does have a history of osteoporosis and has been on bony that for an excess of 5 years. This was discontinued earlier this year. She remains on calcium and vitamin D supplements    Here for Medicare AWV:   1. Risk factors based on Past M, S, F history: no cardiovascular risk factors  2. Physical Activities: remains quite active physically, including a health club 3 times per week  3. Depression/mood: history depression, or mood disorder  4. Hearing: no hearing deficits  5. ADL's: independent in all aspects of daily living  6. Fall Risk: low  7. Home Safety: no problems identified  8. Height, weight, &visual acuity:height and weight stable. No difficulty with visual acuity  9 Counseling- heart healthy diet regular. Exercise regimen encouraged as well as calcium and vitamin D supplementation  10. Labs ordered based on risk factors: complete laboratory profile including lipid panel will be reviewed  11. Referral Coordination- will have mammogram performed in the spring  12. Care Plan- continue heart healthy diet regular. Exercise. Plan  13. Cognitive Assessment- alert and oriented, with normal affect. No memory disturbance, and Lozol, executive functioning without difficulty   Allergies:  1) ! Penicillin G Pot in Dextrose (Penicillin G Potassium in D5w)  2) ! Sulf-10   Past History:  Past Medical History:  Reviewed  history from 03/22/2007 and no changes required.  Allergic rhinitis  GERD  Osteoporosis  Polymyalgia rheumatica   Past Surgical History:  Reviewed history from 04/19/2009 and no changes required.  Hysterectomy  Tubal ligation  colonoscopy 2007   Family History:  Reviewed history from 04/19/2009 and no changes required.  both parents died at age 79. Father from sepsis.  Mother of complications of heart failure  one brother one sister both have hypertension.  Sister on chronic dialysis, SLE, CAD, Asthma   Social History:  Reviewed history from 04/19/2009 and no changes required.  Married  Never Smoked  Regular exercise-yes     Review of Systems  Constitutional: Negative for fever, appetite change, fatigue and unexpected weight change.  HENT: Negative for hearing loss, ear pain, nosebleeds, congestion, sore throat, mouth sores, trouble swallowing, neck stiffness, dental problem, voice change, sinus pressure and tinnitus.   Eyes: Negative for photophobia, pain, redness and visual disturbance.  Respiratory: Negative for cough, chest tightness and shortness of breath.   Cardiovascular: Negative for chest pain, palpitations and leg swelling.  Gastrointestinal: Negative for nausea, vomiting, abdominal pain, diarrhea, constipation, blood in stool, abdominal distention and rectal pain.  Genitourinary: Negative for dysuria, urgency, frequency, hematuria, flank pain, vaginal bleeding, vaginal discharge, difficulty urinating, genital sores, vaginal pain, menstrual problem and pelvic pain.  Musculoskeletal: Negative for back pain and arthralgias.  Skin: Negative for rash.  Neurological: Negative for dizziness, syncope, speech difficulty, weakness, light-headedness, numbness and headaches.  Hematological: Negative for adenopathy. Does not bruise/bleed easily.  Psychiatric/Behavioral: Negative for suicidal ideas,  behavioral problems, self-injury, dysphoric mood and agitation. The patient is  not nervous/anxious.        Objective:   Physical Exam  Constitutional: She is oriented to person, place, and time. She appears well-developed and well-nourished.  HENT:  Head: Normocephalic and atraumatic.  Right Ear: External ear normal.  Left Ear: External ear normal.  Mouth/Throat: Oropharynx is clear and moist.  Eyes: Conjunctivae and EOM are normal.  Neck: Normal range of motion. Neck supple. No JVD present. No thyromegaly present.  Cardiovascular: Normal rate, regular rhythm, normal heart sounds and intact distal pulses.   No murmur heard. Pulmonary/Chest: Effort normal and breath sounds normal. She has no wheezes. She has no rales.  Abdominal: Soft. Bowel sounds are normal. She exhibits no distension and no mass. There is no tenderness. There is no rebound and no guarding.       Lower midline scar  Genitourinary: Vagina normal.  Musculoskeletal: Normal range of motion. She exhibits no edema and no tenderness.  Neurological: She is alert and oriented to person, place, and time. She has normal reflexes. No cranial nerve deficit. She exhibits normal muscle tone. Coordination normal.  Skin: Skin is warm and dry. No rash noted.  Psychiatric: She has a normal mood and affect. Her behavior is normal.          Assessment & Plan   Preventative health examination Osteoporosis. We'll continue calcium and vitamin D supplements. We'll check a bone density in one year Polymyalgia rheumatica. Will increase prednisone to 7.5 mg daily (presently a 5 mg dose) and recheck a sedimentation rate in 3 months. Gastroesophageal reflux disease. Stable on present regimen

## 2011-07-26 ENCOUNTER — Ambulatory Visit (INDEPENDENT_AMBULATORY_CARE_PROVIDER_SITE_OTHER): Payer: Medicare Other | Admitting: Family

## 2011-07-26 ENCOUNTER — Encounter: Payer: Self-pay | Admitting: Family

## 2011-07-26 DIAGNOSIS — J309 Allergic rhinitis, unspecified: Secondary | ICD-10-CM

## 2011-07-26 DIAGNOSIS — J0100 Acute maxillary sinusitis, unspecified: Secondary | ICD-10-CM

## 2011-07-26 MED ORDER — FLUTICASONE PROPIONATE 50 MCG/ACT NA SUSP: 2.0000 | Freq: Every day | NASAL | Status: DC

## 2011-07-26 MED ORDER — CEFUROXIME AXETIL 250 MG PO TABS: 250.0000 mg | ORAL_TABLET | Freq: Two times a day (BID) | ORAL | Status: AC

## 2011-07-26 NOTE — Progress Notes (Signed)
Subjective:    Patient ID: Savannah Jimenez, female    DOB: 1936-04-01, 76 y.o.   MRN: [id number]  HPI And 76 year old white female, nonsmoker, patient of Dr. Kirtland Bouchard. is in today with complaints of extreme sinus pressure and pain since for 2 days and keep her awake at night. She's also had cough, postnasal drip, yellow sputum production that is worsening. She's been taking over-the-counter Claritin without relief. Denies any chest pain, shortness of breath, nausea, vomiting, or edema.   Review of Systems  Constitutional: Positive for fatigue.  HENT: Positive for congestion, sneezing, postnasal drip and sinus pressure.   Eyes: Negative.   Respiratory: Positive for cough.   Cardiovascular: Negative.   Genitourinary: Negative.   Musculoskeletal: Negative.   Neurological: Negative.   Hematological: Negative.   Psychiatric/Behavioral: Negative.    Past Medical History  Diagnosis Date  . Allergy   . GERD (gastroesophageal reflux disease)   . Osteoporosis     History   Social History  . Marital Status: Married    Spouse Name: N/A    Number of Children: N/A  . Years of Education: N/A   Occupational History  . Not on file.   Social History Main Topics  . Smoking status: Never Smoker   . Smokeless tobacco: Never Used  . Alcohol Use: Yes     rarely  . Drug Use: No  . Sexually Active: Not on file   Other Topics Concern  . Not on file   Social History Narrative  . No narrative on file    Past Surgical History  Procedure Date  . Abdominal hysterectomy   . Tubal ligation   . Colonscopy 2007    Family History  Problem Relation Age of Onset  . Heart failure Mother   . Hypertension Sister   . Asthma Sister   . Hypertension Brother     Allergies  Allergen Reactions  . Tetanus Toxoids     Arm swelling   . Penicillins   . Sulfacetamide Sodium     Current Outpatient Prescriptions on File Prior to Visit  Medication Sig Dispense Refill  . Calcium Carbonate-Vitamin D  (CALCIUM-VITAMIN D) 600-200 MG-UNIT CAPS Take by mouth daily.        . Cholecalciferol (VITAMIN D) 1000 UNITS capsule Take 1,000 Units by mouth daily.        . fish oil-omega-3 fatty acids 1000 MG capsule Take 2 g by mouth daily.        Marland Kitchen omeprazole (PRILOSEC) 20 MG capsule Take 1 capsule (20 mg total) by mouth daily.  90 capsule  6  . predniSONE (DELTASONE) 5 MG tablet Take 1 and 1/2 tablet every morning  90 tablet  2  . zolpidem (AMBIEN) 5 MG tablet Take 1 tablet (5 mg total) by mouth at bedtime as needed.  30 tablet  3  . methocarbamol (ROBAXIN) 500 MG tablet Take 500 mg by mouth every 6 (six) hours.        . Multiple Vitamins-Minerals (CENTRUM SILVER PO) Take by mouth daily.          BP 118/70  Pulse 120  Temp(Src) 99.7 F (37.6 C) (Oral)  Wt 108 lb (48.988 kg)chart    Objective:   Physical Exam  Constitutional: She is oriented to person, place, and time. She appears well-developed and well-nourished.  HENT:  Right Ear: External ear normal.  Left Ear: External ear normal.  Nose: Nose normal.  Mouth/Throat: Oropharynx is clear and moist.  Maxillary sinus tenderness to palpation bilaterally. Pharynx moderately red.  Neck: Normal range of motion. Neck supple.  Cardiovascular: Normal rate, regular rhythm and normal heart sounds.   Pulmonary/Chest: Effort normal and breath sounds normal.  Neurological: She is alert and oriented to person, place, and time.  Skin: Skin is warm.  Psychiatric: She has a normal mood and affect.          Assessment & Plan:  Assessment: Allergic rhinitis, early sinusitis  Plan: Flonase 2 sprays in each nostril once a day. Z-Pak as directed. Zyrtec 1 by mouth daily. Tylenol and ibuprofen alternating for aches and pains. Patient to call if symptoms worsen or persist. Recheck as scheduled, and when necessary.

## 2011-07-26 NOTE — Patient Instructions (Signed)
1. Ceftin twice daily 2. Zyrtec every day 3. Flonase 2 sprays in each nostril once a day.   Sinusitis Sinuses are air pockets within the bones of your face. The growth of bacteria within a sinus leads to infection. The infection prevents the sinuses from draining. This infection is called sinusitis. SYMPTOMS  There will be different areas of pain depending on which sinuses have become infected.  The maxillary sinuses often produce pain beneath the eyes.   Frontal sinusitis may cause pain in the middle of the forehead and above the eyes.  Other problems (symptoms) include:  Toothaches.   Colored, pus-like (purulent) drainage from the nose.   Swelling, warmth, and tenderness over the sinus areas may be signs of infection.  TREATMENT  Sinusitis is most often determined by an exam.X-rays may be taken. If x-rays have been taken, make sure you obtain your results or find out how you are to obtain them. Your caregiver may give you medications (antibiotics). These are medications that will help kill the bacteria causing the infection. You may also be given a medication (decongestant) that helps to reduce sinus swelling.  HOME CARE INSTRUCTIONS   Only take over-the-counter or prescription medicines for pain, discomfort, or fever as directed by your caregiver.   Drink extra fluids. Fluids help thin the mucus so your sinuses can drain more easily.   Applying either moist heat or ice packs to the sinus areas may help relieve discomfort.   Use saline nasal sprays to help moisten your sinuses. The sprays can be found at your local drugstore.  SEEK IMMEDIATE MEDICAL CARE IF:  You have a fever.   You have increasing pain, severe headaches, or toothache.   You have nausea, vomiting, or drowsiness.   You develop unusual swelling around the face or trouble seeing.  MAKE SURE YOU:   Understand these instructions.   Will watch your condition.   Will get help right away if you are not doing  well or get worse.  Document Released: 07/10/2005 Document Revised: 03/22/2011 Document Reviewed: 02/06/2007 [facility] Patient Information 2012 [place], [place].

## 2011-08-14 ENCOUNTER — Telehealth: Payer: Self-pay | Admitting: *Deleted

## 2011-08-14 NOTE — Telephone Encounter (Signed)
Spoke with pt- informed of taper - has appt on the books in feb. Will f/u at that time. KIK

## 2011-08-14 NOTE — Telephone Encounter (Signed)
Suggest alternating 7.5 mg every other day with 5 mg every other day for 2 weeks and then 5 mg every day;  Return office visit with sedimentation rate in 4-6 weeks

## 2011-08-14 NOTE — Telephone Encounter (Signed)
Pt is taking 7.5 mg of Prednisone, and would like to decrease to 5 mg.

## 2011-08-31 ENCOUNTER — Ambulatory Visit (INDEPENDENT_AMBULATORY_CARE_PROVIDER_SITE_OTHER): Payer: Medicare Other | Admitting: Internal Medicine

## 2011-08-31 ENCOUNTER — Encounter: Payer: Self-pay | Admitting: Internal Medicine

## 2011-08-31 VITALS — BP 100/70 | HR 115 | Temp 97.8°F | Wt 110.0 lb

## 2011-08-31 DIAGNOSIS — J309 Allergic rhinitis, unspecified: Secondary | ICD-10-CM | POA: Diagnosis not present

## 2011-08-31 DIAGNOSIS — M353 Polymyalgia rheumatica: Secondary | ICD-10-CM | POA: Diagnosis not present

## 2011-08-31 DIAGNOSIS — M810 Age-related osteoporosis without current pathological fracture: Secondary | ICD-10-CM | POA: Diagnosis not present

## 2011-08-31 DIAGNOSIS — R319 Hematuria, unspecified: Secondary | ICD-10-CM | POA: Diagnosis not present

## 2011-08-31 LAB — POCT URINALYSIS DIPSTICK
Bilirubin, UA: NEGATIVE
Glucose, UA: NEGATIVE
Nitrite, UA: NEGATIVE
Spec Grav, UA: 1.015
Urobilinogen, UA: 0.2

## 2011-08-31 LAB — SEDIMENTATION RATE: Sed Rate: 29 mm/hr — ABNORMAL HIGH (ref 0–22)

## 2011-08-31 MED ORDER — PREDNISONE 5 MG PO TABS: 5.0000 mg | ORAL_TABLET | Freq: Every day | ORAL | Status: AC

## 2011-08-31 NOTE — Progress Notes (Signed)
  Subjective:    Patient ID: Savannah Jimenez, female    DOB: 11/22/1935, 76 y.o.   MRN: [id number]  HPI  76 year old patient who has a history of polymyalgia rheumatica and is seen today for her three-month followup. She is doing quite well and is now on prednisone 5 mg daily. 4 days ago she noted a small amount of blood on the tissue paper. There is no gross hematuria. She has had no subsequent symptoms. No genitourinary symptoms She has a history of osteoporosis and remains on calcium and vitamin D supplements.    Review of Systems  Constitutional: Negative.   HENT: Negative for hearing loss, congestion, sore throat, rhinorrhea, dental problem, sinus pressure and tinnitus.   Eyes: Negative for pain, discharge and visual disturbance.  Respiratory: Negative for cough and shortness of breath.   Cardiovascular: Negative for chest pain, palpitations and leg swelling.  Gastrointestinal: Negative for nausea, vomiting, abdominal pain, diarrhea, constipation, blood in stool and abdominal distention.  Genitourinary: Negative for dysuria, urgency, frequency, hematuria, flank pain, vaginal bleeding, vaginal discharge, difficulty urinating, vaginal pain and pelvic pain.  Musculoskeletal: Negative for joint swelling, arthralgias and gait problem.  Skin: Negative for rash.  Neurological: Negative for dizziness, syncope, speech difficulty, weakness, numbness and headaches.  Hematological: Negative for adenopathy.  Psychiatric/Behavioral: Negative for behavioral problems, dysphoric mood and agitation. The patient is not nervous/anxious.        Objective:   Physical Exam  Constitutional: She is oriented to person, place, and time. She appears well-developed and well-nourished.  HENT:  Head: Normocephalic.  Right Ear: External ear normal.  Left Ear: External ear normal.  Mouth/Throat: Oropharynx is clear and moist.  Eyes: Conjunctivae and EOM are normal. Pupils are equal, round, and reactive to light.    Neck: Normal range of motion. Neck supple. No thyromegaly present.  Cardiovascular: Normal rate, regular rhythm, normal heart sounds and intact distal pulses.   Pulmonary/Chest: Effort normal and breath sounds normal.  Abdominal: Soft. Bowel sounds are normal. She exhibits no mass. There is no tenderness.  Musculoskeletal: Normal range of motion.  Lymphadenopathy:    She has no cervical adenopathy.  Neurological: She is alert and oriented to person, place, and time.  Skin: Skin is warm and dry. No rash noted.  Psychiatric: She has a normal mood and affect. Her behavior is normal.          Assessment & Plan:    Polymyalgia rheumatica. Appears to be clinically stable. We'll continue prednisone 5 mg daily and check a sedimentation rate today Allergic rhinitis Osteoporosis

## 2011-08-31 NOTE — Patient Instructions (Signed)
It is important that you exercise regularly, at least 20 minutes 3 to 4 times per week.  If you develop chest pain or shortness of breath seek  medical attention.  Take a calcium supplement, plus 800-1200 units of vitamin D  Return in 3 months for follow-up  

## 2011-09-28 DIAGNOSIS — H353190 Nonexudative age-related macular degeneration, unspecified eye, stage unspecified: Secondary | ICD-10-CM | POA: Diagnosis not present

## 2011-09-28 DIAGNOSIS — H40019 Open angle with borderline findings, low risk, unspecified eye: Secondary | ICD-10-CM | POA: Diagnosis not present

## 2011-09-28 DIAGNOSIS — H2510 Age-related nuclear cataract, unspecified eye: Secondary | ICD-10-CM | POA: Diagnosis not present

## 2011-09-28 DIAGNOSIS — H538 Other visual disturbances: Secondary | ICD-10-CM | POA: Diagnosis not present

## 2011-09-28 DIAGNOSIS — H524 Presbyopia: Secondary | ICD-10-CM | POA: Diagnosis not present

## 2011-10-05 DIAGNOSIS — H40019 Open angle with borderline findings, low risk, unspecified eye: Secondary | ICD-10-CM | POA: Diagnosis not present

## 2011-10-05 DIAGNOSIS — H2510 Age-related nuclear cataract, unspecified eye: Secondary | ICD-10-CM | POA: Diagnosis not present

## 2011-10-09 ENCOUNTER — Ambulatory Visit (HOSPITAL_BASED_OUTPATIENT_CLINIC_OR_DEPARTMENT_OTHER)
Admission: RE | Admit: 2011-10-09 | Discharge: 2011-10-09 | Disposition: A | Discharge status: Home / Self Care | Payer: Medicare Other | Source: Ambulatory Visit | Attending: Internal Medicine | Admitting: Internal Medicine

## 2011-10-09 DIAGNOSIS — Z1231 Encounter for screening mammogram for malignant neoplasm of breast: Secondary | ICD-10-CM

## 2011-10-23 HISTORY — PX: CATARACT EXTRACTION: SUR2

## 2011-10-31 DIAGNOSIS — H538 Other visual disturbances: Secondary | ICD-10-CM | POA: Diagnosis not present

## 2011-10-31 DIAGNOSIS — H269 Unspecified cataract: Secondary | ICD-10-CM | POA: Diagnosis not present

## 2011-10-31 DIAGNOSIS — H2510 Age-related nuclear cataract, unspecified eye: Secondary | ICD-10-CM | POA: Diagnosis not present

## 2011-11-28 ENCOUNTER — Ambulatory Visit (INDEPENDENT_AMBULATORY_CARE_PROVIDER_SITE_OTHER): Payer: Medicare Other | Admitting: Internal Medicine

## 2011-11-28 ENCOUNTER — Encounter: Payer: Self-pay | Admitting: Internal Medicine

## 2011-11-28 VITALS — BP 130/70 | Temp 98.2°F | Wt 114.0 lb

## 2011-11-28 DIAGNOSIS — M353 Polymyalgia rheumatica: Secondary | ICD-10-CM

## 2011-11-28 DIAGNOSIS — Z87448 Personal history of other diseases of urinary system: Secondary | ICD-10-CM

## 2011-11-28 LAB — SEDIMENTATION RATE: Sed Rate: 31 mm/hr — ABNORMAL HIGH (ref 0–22)

## 2011-11-28 LAB — POCT URINALYSIS DIPSTICK
Ketones, UA: NEGATIVE
Leukocytes, UA: NEGATIVE
Nitrite, UA: NEGATIVE
Protein, UA: NEGATIVE
pH, UA: 6

## 2011-11-28 MED ORDER — ZOLPIDEM TARTRATE 5 MG PO TABS: 5.0000 mg | ORAL_TABLET | Freq: Every evening | ORAL | Status: DC | PRN

## 2011-11-28 MED ORDER — PREDNISONE 5 MG PO TABS: 5.0000 mg | ORAL_TABLET | Freq: Every day | ORAL | Status: DC

## 2011-11-28 NOTE — Patient Instructions (Signed)
It is important that you exercise regularly, at least 20 minutes 3 to 4 times per week.  If you develop chest pain or shortness of breath seek  medical attention.  Take a calcium supplement, plus 800-1200 units of vitamin D  Return in 3 months for follow-up  

## 2011-11-28 NOTE — Progress Notes (Signed)
  Subjective:    Patient ID: Savannah Jimenez, female    DOB: 03-30-1936, 76 y.o.   MRN: [id number]  HPI  76 year old patient who is seen today for followup of polymyalgia rheumatica. She has done remarkably well on prednisone 5 mg every morning.   She states that the prednisone has caused some insomnia and she is requesting a refill on Ambien.   Review of Systems  Constitutional: Negative.   HENT: Negative for hearing loss, congestion, sore throat, rhinorrhea, dental problem, sinus pressure and tinnitus.   Eyes: Negative for pain, discharge and visual disturbance.  Respiratory: Negative for cough and shortness of breath.   Cardiovascular: Negative for chest pain, palpitations and leg swelling.  Gastrointestinal: Negative for nausea, vomiting, abdominal pain, diarrhea, constipation, blood in stool and abdominal distention.  Genitourinary: Negative for dysuria, urgency, frequency, hematuria, flank pain, vaginal bleeding, vaginal discharge, difficulty urinating, vaginal pain and pelvic pain.  Musculoskeletal: Negative for joint swelling, arthralgias (mild stiff neck only) and gait problem.  Skin: Negative for rash.  Neurological: Negative for dizziness, syncope, speech difficulty, weakness, numbness and headaches.  Hematological: Negative for adenopathy.  Psychiatric/Behavioral: Negative for behavioral problems, dysphoric mood and agitation. The patient is not nervous/anxious.        Objective:   Physical Exam  Constitutional: She is oriented to person, place, and time. She appears well-developed and well-nourished.  HENT:  Head: Normocephalic.  Right Ear: External ear normal.  Left Ear: External ear normal.  Mouth/Throat: Oropharynx is clear and moist.  Eyes: Conjunctivae and EOM are normal. Pupils are equal, round, and reactive to light.  Neck: Normal range of motion. Neck supple. No thyromegaly present.  Cardiovascular: Normal rate, regular rhythm, normal heart sounds and intact distal  pulses.   Pulmonary/Chest: Effort normal and breath sounds normal.  Abdominal: Soft. Bowel sounds are normal. She exhibits no mass. There is no tenderness.  Musculoskeletal: Normal range of motion.  Lymphadenopathy:    She has no cervical adenopathy.  Neurological: She is alert and oriented to person, place, and time.  Skin: Skin is warm and dry. No rash noted.  Psychiatric: She has a normal mood and affect. Her behavior is normal.          Assessment & Plan:   Polymyalgia rheumatica. We'll check a sed rate. If normal will decrease prednisone to 5 mg on odd days and 2.5 mg even Insomnia. Ambien refilled

## 2011-11-29 NOTE — Progress Notes (Signed)
Quick Note:  Spoke with pt- informed of results and decrease med. KIK ______

## 2011-12-04 DIAGNOSIS — H2510 Age-related nuclear cataract, unspecified eye: Secondary | ICD-10-CM | POA: Diagnosis not present

## 2011-12-12 DIAGNOSIS — H2510 Age-related nuclear cataract, unspecified eye: Secondary | ICD-10-CM | POA: Diagnosis not present

## 2011-12-12 DIAGNOSIS — H269 Unspecified cataract: Secondary | ICD-10-CM | POA: Diagnosis not present

## 2011-12-13 ENCOUNTER — Other Ambulatory Visit: Payer: Self-pay | Admitting: Internal Medicine

## 2011-12-13 DIAGNOSIS — Z1231 Encounter for screening mammogram for malignant neoplasm of breast: Secondary | ICD-10-CM

## 2011-12-21 DIAGNOSIS — D485 Neoplasm of uncertain behavior of skin: Secondary | ICD-10-CM | POA: Diagnosis not present

## 2011-12-21 DIAGNOSIS — L821 Other seborrheic keratosis: Secondary | ICD-10-CM | POA: Diagnosis not present

## 2011-12-21 DIAGNOSIS — L259 Unspecified contact dermatitis, unspecified cause: Secondary | ICD-10-CM | POA: Diagnosis not present

## 2011-12-21 DIAGNOSIS — L820 Inflamed seborrheic keratosis: Secondary | ICD-10-CM | POA: Diagnosis not present

## 2011-12-21 DIAGNOSIS — L565 Disseminated superficial actinic porokeratosis (DSAP): Secondary | ICD-10-CM | POA: Diagnosis not present

## 2011-12-21 DIAGNOSIS — Z85828 Personal history of other malignant neoplasm of skin: Secondary | ICD-10-CM | POA: Diagnosis not present

## 2011-12-21 DIAGNOSIS — B351 Tinea unguium: Secondary | ICD-10-CM | POA: Diagnosis not present

## 2012-02-27 ENCOUNTER — Encounter: Payer: Self-pay | Admitting: Internal Medicine

## 2012-02-27 ENCOUNTER — Ambulatory Visit (INDEPENDENT_AMBULATORY_CARE_PROVIDER_SITE_OTHER): Payer: Medicare Other | Admitting: Internal Medicine

## 2012-02-27 VITALS — BP 118/74 | Temp 98.5°F | Wt 118.0 lb

## 2012-02-27 DIAGNOSIS — M353 Polymyalgia rheumatica: Secondary | ICD-10-CM | POA: Diagnosis not present

## 2012-02-27 NOTE — Progress Notes (Signed)
  Subjective:    Patient ID: Savannah Jimenez, female    DOB: Dec 01, 1935, 76 y.o.   MRN: [id number]  HPI  76 year old patient who is seen today for followup of PMR. 3 months ago there was a dose titration of 5 mg daily alternating with 2.5 mg every other day. She did not do well on this regimen and titrated to a regimen of 5 mg alternating with 3.75 mg. She has done well on this latter regimen. She is scheduled for annual exam in November. Otherwise done well    Review of Systems  Constitutional: Negative.   HENT: Negative for hearing loss, congestion, sore throat, rhinorrhea, dental problem, sinus pressure and tinnitus.   Eyes: Negative for pain, discharge and visual disturbance.  Respiratory: Negative for cough and shortness of breath.   Cardiovascular: Negative for chest pain, palpitations and leg swelling.  Gastrointestinal: Negative for nausea, vomiting, abdominal pain, diarrhea, constipation, blood in stool and abdominal distention.  Genitourinary: Negative for dysuria, urgency, frequency, hematuria, flank pain, vaginal bleeding, vaginal discharge, difficulty urinating, vaginal pain and pelvic pain.  Musculoskeletal: Positive for arthralgias. Negative for joint swelling and gait problem.  Skin: Negative for rash.  Neurological: Negative for dizziness, syncope, speech difficulty, weakness, numbness and headaches.  Hematological: Negative for adenopathy.  Psychiatric/Behavioral: Negative for behavioral problems, dysphoric mood and agitation. The patient is not nervous/anxious.        Objective:   Physical Exam  Constitutional: She appears well-developed and well-nourished. No distress.  Musculoskeletal: Normal range of motion. She exhibits no edema and no tenderness.          Assessment & Plan:   PMR stable. We'll continue present dose. One month prior to her annual exam in November we'll decrease prednisone to 3.75 mg daily. Sedimentation rate will be checked at that time along  with annual lab

## 2012-05-22 DIAGNOSIS — L820 Inflamed seborrheic keratosis: Secondary | ICD-10-CM | POA: Diagnosis not present

## 2012-05-22 DIAGNOSIS — L570 Actinic keratosis: Secondary | ICD-10-CM | POA: Diagnosis not present

## 2012-05-31 ENCOUNTER — Telehealth: Payer: Self-pay | Admitting: Internal Medicine

## 2012-05-31 ENCOUNTER — Encounter: Payer: Self-pay | Admitting: Internal Medicine

## 2012-05-31 ENCOUNTER — Ambulatory Visit (INDEPENDENT_AMBULATORY_CARE_PROVIDER_SITE_OTHER): Payer: Medicare Other | Admitting: Internal Medicine

## 2012-05-31 VITALS — BP 110/70 | HR 70 | Temp 97.9°F | Resp 18 | Ht 63.0 in | Wt 119.0 lb

## 2012-05-31 DIAGNOSIS — J309 Allergic rhinitis, unspecified: Secondary | ICD-10-CM

## 2012-05-31 DIAGNOSIS — M810 Age-related osteoporosis without current pathological fracture: Secondary | ICD-10-CM | POA: Diagnosis not present

## 2012-05-31 DIAGNOSIS — E785 Hyperlipidemia, unspecified: Secondary | ICD-10-CM | POA: Diagnosis not present

## 2012-05-31 DIAGNOSIS — M353 Polymyalgia rheumatica: Secondary | ICD-10-CM | POA: Diagnosis not present

## 2012-05-31 DIAGNOSIS — Z0000 Encounter for general adult medical examination without abnormal findings: Secondary | ICD-10-CM | POA: Diagnosis not present

## 2012-05-31 LAB — TSH: TSH: 0.84 u[IU]/mL (ref 0.35–5.50)

## 2012-05-31 LAB — COMPREHENSIVE METABOLIC PANEL
ALT: 21 U/L (ref 0–35)
AST: 26 U/L (ref 0–37)
Calcium: 9.6 mg/dL (ref 8.4–10.5)
Chloride: 106 mEq/L (ref 96–112)
Creatinine, Ser: 0.7 mg/dL (ref 0.4–1.2)
Total Bilirubin: 0.6 mg/dL (ref 0.3–1.2)

## 2012-05-31 LAB — CBC WITH DIFFERENTIAL/PLATELET
Basophils Relative: 0.4 % (ref 0.0–3.0)
Eosinophils Absolute: 0 10*3/uL (ref 0.0–0.7)
HCT: 39.8 % (ref 36.0–46.0)
Hemoglobin: 12.9 g/dL (ref 12.0–15.0)
Lymphs Abs: 2.1 10*3/uL (ref 0.7–4.0)
MCHC: 32.4 g/dL (ref 30.0–36.0)
MCV: 95.3 fl (ref 78.0–100.0)
Monocytes Absolute: 0.5 10*3/uL (ref 0.1–1.0)
Neutro Abs: 3.4 10*3/uL (ref 1.4–7.7)
RBC: 4.18 Mil/uL (ref 3.87–5.11)

## 2012-05-31 LAB — LIPID PANEL: HDL: 77.8 mg/dL (ref >39.00)

## 2012-05-31 MED ORDER — OMEPRAZOLE 20 MG PO CPDR: 20.0000 mg | DELAYED_RELEASE_CAPSULE | Freq: Every day | ORAL | Status: DC

## 2012-05-31 MED ORDER — FLUTICASONE PROPIONATE 50 MCG/ACT NA SUSP: 2.0000 | Freq: Every day | NASAL | Status: DC

## 2012-05-31 MED ORDER — ZOLPIDEM TARTRATE 5 MG PO TABS: 5.0000 mg | ORAL_TABLET | Freq: Every evening | ORAL | Status: DC | PRN

## 2012-05-31 MED ORDER — PREDNISONE 2.5 MG PO TABS: ORAL_TABLET | ORAL | Status: DC

## 2012-05-31 MED ORDER — PREDNISONE 5 MG PO TABS: ORAL_TABLET | ORAL | Status: DC

## 2012-05-31 NOTE — Progress Notes (Signed)
Subjective:    Patient ID: Savannah Jimenez, female    DOB: Dec 01, 1935, 76 y.o.   MRN: [id number]  HPI  CC: cpx - doing well .   History of Present Illness:   76  year-old patient who is seen today for a comprehensive evaluation. She has a history of osteoporosis, gastroesophageal reflux disease, and allergic rhinitis. She has  been diagnosed with POLYmyalgia rheumatica and had a prednisone reduction  to 7.5 mg daily. She does have a history of osteoporosis and has been on bony that for an excess of 5 years. This was discontinued earlier this year. She remains on calcium and vitamin D supplements    Here for Medicare AWV:   1. Risk factors based on Past M, S, F history: no cardiovascular risk factors  2. Physical Activities: remains quite active physically, including a health club 3 times per week  3. Depression/mood: history depression, or mood disorder  4. Hearing: no hearing deficits  5. ADL's: independent in all aspects of daily living  6. Fall Risk: low  7. Home Safety: no problems identified  8. Height, weight, &visual acuity:height and weight stable. No difficulty with visual acuity  9 Counseling- heart healthy diet regular. Exercise regimen encouraged as well as calcium and vitamin D supplementation  10. Labs ordered based on risk factors: complete laboratory profile including lipid panel will be reviewed  11. Referral Coordination- will have mammogram performed in the spring  12. Care Plan- continue heart healthy diet regular. Exercise. Plan  13. Cognitive Assessment- alert and oriented, with normal affect. No memory disturbance, and Lozol, executive functioning without difficulty   Allergies:  1) ! Penicillin G Pot in Dextrose (Penicillin G Potassium in D5w)  2) ! Sulf-10   Past History:  Past Medical History:  Reviewed history from 03/22/2007 and no changes required.  Allergic rhinitis  GERD  Osteoporosis  Polymyalgia rheumatica   Past Surgical History:  Reviewed  history from 04/19/2009 and no changes required.  Hysterectomy  Tubal ligation  colonoscopy 2007   Family History:  Reviewed history from 04/19/2009 and no changes required.  both parents died at age 48. Father from sepsis.  Mother of complications of heart failure  one brother one sister both have hypertension.  Sister on chronic dialysis, SLE, CAD, Asthma   Social History:  Reviewed history from 04/19/2009 and no changes required.  Married  Never Smoked  Regular exercise-yes    Past Medical History  Diagnosis Date  . Allergy   . GERD (gastroesophageal reflux disease)   . Osteoporosis     History   Social History  . Marital Status: Married    Spouse Name: N/A    Number of Children: N/A  . Years of Education: N/A   Occupational History  . Not on file.   Social History Main Topics  . Smoking status: Never Smoker   . Smokeless tobacco: Never Used  . Alcohol Use: Yes     Comment: rarely  . Drug Use: No  . Sexually Active: Not on file   Other Topics Concern  . Not on file   Social History Narrative  . No narrative on file    Past Surgical History  Procedure Date  . Abdominal hysterectomy   . Tubal ligation   . Colonscopy 2007  . Cataract extraction 10/2011    Left    Family History  Problem Relation Age of Onset  . Heart failure Mother   . Hypertension Sister   . Asthma  Sister   . Hypertension Brother     Allergies  Allergen Reactions  . Tetanus Toxoids     Arm swelling   . Penicillins   . Sulfacetamide Sodium     Current Outpatient Prescriptions on File Prior to Visit  Medication Sig Dispense Refill  . Calcium Carbonate-Vitamin D (CALCIUM-VITAMIN D) 600-200 MG-UNIT CAPS Take by mouth daily.        . Cholecalciferol (VITAMIN D) 1000 UNITS capsule Take 1,000 Units by mouth daily.        . fish oil-omega-3 fatty acids 1000 MG capsule Take 2 g by mouth daily.        . fluticasone (FLONASE) 50 MCG/ACT nasal spray Place 2 sprays into the nose  daily.      . methocarbamol (ROBAXIN) 500 MG tablet Take 500 mg by mouth every 6 (six) hours.       . Multiple Vitamins-Minerals (CENTRUM SILVER PO) Take by mouth daily.        Marland Kitchen omeprazole (PRILOSEC) 20 MG capsule Take 1 capsule (20 mg total) by mouth daily.  90 capsule  6  . predniSONE (DELTASONE) 5 MG tablet Take 1 tablet (5 mg total) by mouth daily.  90 tablet  2  . zolpidem (AMBIEN) 5 MG tablet Take 1 tablet (5 mg total) by mouth at bedtime as needed.  90 tablet  1    BP 110/70  Pulse 70  Temp 97.9 F (36.6 C) (Oral)  Resp 18  Ht 5\' 3"  (1.6 m)  Wt 119 lb (53.978 kg)  BMI 21.08 kg/m2  SpO2 97%      Review of Systems  Constitutional: Negative for fever, appetite change, fatigue and unexpected weight change.  HENT: Negative for hearing loss, ear pain, nosebleeds, congestion, sore throat, mouth sores, trouble swallowing, neck stiffness, dental problem, voice change, sinus pressure and tinnitus.   Eyes: Negative for photophobia, pain, redness and visual disturbance.  Respiratory: Negative for cough, chest tightness and shortness of breath.   Cardiovascular: Negative for chest pain, palpitations and leg swelling.  Gastrointestinal: Negative for nausea, vomiting, abdominal pain, diarrhea, constipation, blood in stool, abdominal distention and rectal pain.  Genitourinary: Negative for dysuria, urgency, frequency, hematuria, flank pain, vaginal bleeding, vaginal discharge, difficulty urinating, genital sores, vaginal pain, menstrual problem and pelvic pain.  Musculoskeletal: Negative for back pain and arthralgias.  Skin: Negative for rash.  Neurological: Negative for dizziness, syncope, speech difficulty, weakness, light-headedness, numbness and headaches.  Hematological: Negative for adenopathy. Does not bruise/bleed easily.  Psychiatric/Behavioral: Negative for suicidal ideas, behavioral problems, self-injury, dysphoric mood and agitation. The patient is not nervous/anxious.          Objective:   Physical Exam  Constitutional: She is oriented to person, place, and time. She appears well-developed and well-nourished.  HENT:  Head: Normocephalic and atraumatic.  Right Ear: External ear normal.  Left Ear: External ear normal.  Mouth/Throat: Oropharynx is clear and moist.  Eyes: Conjunctivae normal and EOM are normal.  Neck: Normal range of motion. Neck supple. No JVD present. No thyromegaly present.  Cardiovascular: Normal rate, regular rhythm, normal heart sounds and intact distal pulses.   No murmur heard. Pulmonary/Chest: Effort normal and breath sounds normal. She has no wheezes. She has no rales.  Abdominal: Soft. Bowel sounds are normal. She exhibits no distension and no mass. There is no tenderness. There is no rebound and no guarding.       Lower midline scar  Genitourinary: Vagina normal.  Musculoskeletal: Normal range of  motion. She exhibits no edema and no tenderness.  Neurological: She is alert and oriented to person, place, and time. She has normal reflexes. No cranial nerve deficit. She exhibits normal muscle tone. Coordination normal.  Skin: Skin is warm and dry. No rash noted.  Psychiatric: She has a normal mood and affect. Her behavior is normal.          Assessment & Plan   Preventative health examination Osteoporosis. We'll continue calcium and vitamin D supplements. We'll check a bone density in one year Polymyalgia rheumatica. Will increase prednisone to 3.75 mg alternating with 2.5 mg every other day  Gastroesophageal reflux disease. Stable on present regimen

## 2012-05-31 NOTE — Telephone Encounter (Signed)
Dr. Amador Cunas put in order for this - do you schedule? The girls up front? Please assist

## 2012-05-31 NOTE — Patient Instructions (Addendum)
Bone density study  Take a calcium supplement, plus [phone] units of vitamin D    It is important that you exercise regularly, at least 20 minutes 3 to 4 times per week.  If you develop chest pain or shortness of breath seek  medical attention.  Return in 6 months for follow-up  (1 month prior to return visit decrease prednisone to 2.5 mg daily)

## 2012-05-31 NOTE — Telephone Encounter (Signed)
I do not normally schedule Bone Density the schedulers do however if a bone density was put in and assigned to [facility] will pick it up on their normally will call the pt to schedule.

## 2012-05-31 NOTE — Telephone Encounter (Signed)
Pt would a referral for bone density test at [facility] [place]. Pt needs appt after march 15,2014.

## 2012-05-31 NOTE — Telephone Encounter (Signed)
Order changed and they will call and set up

## 2012-06-03 ENCOUNTER — Telehealth: Payer: Self-pay | Admitting: Internal Medicine

## 2012-06-03 NOTE — Telephone Encounter (Signed)
Pt would to sch bone density before end of December instead of in March 2014. [facility] [place]. Order has been sent to breast center. Pt is aware

## 2012-06-03 NOTE — Telephone Encounter (Signed)
Error/njr °

## 2012-06-11 ENCOUNTER — Other Ambulatory Visit: Payer: Self-pay | Admitting: General Practice

## 2012-06-11 ENCOUNTER — Ambulatory Visit
Admission: RE | Admit: 2012-06-11 | Discharge: 2012-06-11 | Disposition: A | Discharge status: Home / Self Care | Payer: Medicare Other | Source: Ambulatory Visit | Attending: Internal Medicine | Admitting: Internal Medicine

## 2012-06-11 DIAGNOSIS — M353 Polymyalgia rheumatica: Secondary | ICD-10-CM

## 2012-06-11 DIAGNOSIS — M949 Disorder of cartilage, unspecified: Secondary | ICD-10-CM | POA: Diagnosis not present

## 2012-06-11 DIAGNOSIS — M810 Age-related osteoporosis without current pathological fracture: Secondary | ICD-10-CM

## 2012-06-11 MED ORDER — ALENDRONATE SODIUM 70 MG PO TABS: 70.0000 mg | ORAL_TABLET | ORAL | Status: DC

## 2012-07-09 ENCOUNTER — Encounter: Payer: Self-pay | Admitting: Internal Medicine

## 2012-07-10 DIAGNOSIS — H40019 Open angle with borderline findings, low risk, unspecified eye: Secondary | ICD-10-CM | POA: Diagnosis not present

## 2012-07-10 DIAGNOSIS — H353190 Nonexudative age-related macular degeneration, unspecified eye, stage unspecified: Secondary | ICD-10-CM | POA: Diagnosis not present

## 2012-07-12 ENCOUNTER — Other Ambulatory Visit: Payer: Self-pay | Admitting: Plastic Surgery

## 2012-07-12 DIAGNOSIS — L821 Other seborrheic keratosis: Secondary | ICD-10-CM | POA: Diagnosis not present

## 2012-07-26 ENCOUNTER — Encounter: Payer: Self-pay | Admitting: Internal Medicine

## 2012-07-29 DIAGNOSIS — Z23 Encounter for immunization: Secondary | ICD-10-CM | POA: Diagnosis not present

## 2012-09-01 ENCOUNTER — Encounter: Payer: Self-pay | Admitting: Internal Medicine

## 2012-09-04 MED ORDER — RISEDRONATE SODIUM 150 MG PO TABS: 150.0000 mg | ORAL_TABLET | ORAL | Status: DC

## 2012-09-04 NOTE — Telephone Encounter (Signed)
Pt notified Rx for Actonel 150 mg tablet was called into pharmacy. Pt verbalized understanding.

## 2012-09-07 ENCOUNTER — Other Ambulatory Visit: Payer: Self-pay

## 2012-10-09 ENCOUNTER — Ambulatory Visit (HOSPITAL_BASED_OUTPATIENT_CLINIC_OR_DEPARTMENT_OTHER)
Admission: RE | Admit: 2012-10-09 | Discharge: 2012-10-09 | Disposition: A | Discharge status: Home / Self Care | Payer: Medicare Other | Source: Ambulatory Visit | Attending: Internal Medicine | Admitting: Internal Medicine

## 2012-10-09 DIAGNOSIS — Z1231 Encounter for screening mammogram for malignant neoplasm of breast: Secondary | ICD-10-CM

## 2012-10-11 ENCOUNTER — Other Ambulatory Visit: Payer: Self-pay | Admitting: Internal Medicine

## 2012-10-11 DIAGNOSIS — R928 Other abnormal and inconclusive findings on diagnostic imaging of breast: Secondary | ICD-10-CM

## 2012-10-22 ENCOUNTER — Ambulatory Visit
Admission: RE | Admit: 2012-10-22 | Discharge: 2012-10-22 | Disposition: A | Discharge status: Home / Self Care | Payer: PRIVATE HEALTH INSURANCE | Source: Ambulatory Visit | Attending: Internal Medicine | Admitting: Internal Medicine

## 2012-10-22 DIAGNOSIS — R928 Other abnormal and inconclusive findings on diagnostic imaging of breast: Secondary | ICD-10-CM

## 2012-10-22 DIAGNOSIS — R922 Inconclusive mammogram: Secondary | ICD-10-CM | POA: Diagnosis not present

## 2012-11-06 DIAGNOSIS — H04129 Dry eye syndrome of unspecified lacrimal gland: Secondary | ICD-10-CM | POA: Diagnosis not present

## 2012-11-06 DIAGNOSIS — H40019 Open angle with borderline findings, low risk, unspecified eye: Secondary | ICD-10-CM | POA: Diagnosis not present

## 2012-11-28 ENCOUNTER — Encounter: Payer: Self-pay | Admitting: Internal Medicine

## 2012-11-28 ENCOUNTER — Ambulatory Visit (INDEPENDENT_AMBULATORY_CARE_PROVIDER_SITE_OTHER): Payer: Medicare Other | Admitting: Internal Medicine

## 2012-11-28 VITALS — BP 110/70 | HR 81 | Temp 98.0°F | Resp 18 | Wt 122.0 lb

## 2012-11-28 DIAGNOSIS — M810 Age-related osteoporosis without current pathological fracture: Secondary | ICD-10-CM | POA: Diagnosis not present

## 2012-11-28 DIAGNOSIS — M353 Polymyalgia rheumatica: Secondary | ICD-10-CM

## 2012-11-28 LAB — SEDIMENTATION RATE: Sed Rate: 34 mm/hr — ABNORMAL HIGH (ref 0–22)

## 2012-11-28 NOTE — Progress Notes (Signed)
Subjective:    Patient ID: Savannah Jimenez, female    DOB: June 28, 1936, 77 y.o.   MRN: [id number]  HPI  77 year old patient who has osteoporosis and also a history of polymyalgia rheumatica. She presently is on prednisone 2.5 mg daily. She has been using Fosamax only every other week due to concerns of side effects.  Otherwise doing quite well without concerns or complaints  Past Medical History  Diagnosis Date  . Allergy   . GERD (gastroesophageal reflux disease)   . Osteoporosis     History   Social History  . Marital Status: Married    Spouse Name: N/A    Number of Children: N/A  . Years of Education: N/A   Occupational History  . Not on file.   Social History Main Topics  . Smoking status: Never Smoker   . Smokeless tobacco: Never Used  . Alcohol Use: Yes     Comment: rarely  . Drug Use: No  . Sexually Active: Not on file   Other Topics Concern  . Not on file   Social History Narrative  . No narrative on file    Past Surgical History  Procedure Laterality Date  . Abdominal hysterectomy    . Tubal ligation    . Colonscopy  2007  . Cataract extraction  10/2011    Left    Family History  Problem Relation Age of Onset  . Heart failure Mother   . Hypertension Sister   . Asthma Sister   . Hypertension Brother     Allergies  Allergen Reactions  . Tetanus Toxoids     Arm swelling   . Penicillins   . Sulfacetamide Sodium     Current Outpatient Prescriptions on File Prior to Visit  Medication Sig Dispense Refill  . alendronate (FOSAMAX) 70 MG tablet Take 1 tablet (70 mg total) by mouth every 7 (seven) days. Take with a full glass of water on an empty stomach.  12 tablet  4  . Calcium Carbonate-Vitamin D (CALCIUM-VITAMIN D) 600-200 MG-UNIT CAPS Take by mouth daily.        . Cholecalciferol (VITAMIN D) 1000 UNITS capsule Take 1,000 Units by mouth daily.        . fish oil-omega-3 fatty acids 1000 MG capsule Take 2 g by mouth daily.        . fluticasone  (FLONASE) 50 MCG/ACT nasal spray Place 2 sprays into the nose daily.  16 g  6  . methocarbamol (ROBAXIN) 500 MG tablet Take 500 mg by mouth every 6 (six) hours.       Marland Kitchen omeprazole (PRILOSEC) 20 MG capsule Take 1 capsule (20 mg total) by mouth daily.  90 capsule  6  . predniSONE (DELTASONE) 2.5 MG tablet 1 tablet on even days and 1-1/2 tablets on odd days  90 tablet  6  . zolpidem (AMBIEN) 5 MG tablet Take 1 tablet (5 mg total) by mouth at bedtime as needed.  90 tablet  1   No current facility-administered medications on file prior to visit.    BP 110/70  Pulse 81  Temp(Src) 98 F (36.7 C) (Oral)  Resp 18  Wt 122 lb (55.339 kg)  BMI 21.62 kg/m2  SpO2 98%       Review of Systems  Constitutional: Negative.   HENT: Negative for hearing loss, congestion, sore throat, rhinorrhea, dental problem, sinus pressure and tinnitus.   Eyes: Negative for pain, discharge and visual disturbance.  Respiratory: Negative for cough and shortness  of breath.   Cardiovascular: Negative for chest pain, palpitations and leg swelling.  Gastrointestinal: Negative for nausea, vomiting, abdominal pain, diarrhea, constipation, blood in stool and abdominal distention.  Genitourinary: Negative for dysuria, urgency, frequency, hematuria, flank pain, vaginal bleeding, vaginal discharge, difficulty urinating, vaginal pain and pelvic pain.  Musculoskeletal: Negative for joint swelling, arthralgias and gait problem.  Skin: Negative for rash.  Neurological: Negative for dizziness, syncope, speech difficulty, weakness, numbness and headaches.  Hematological: Negative for adenopathy.  Psychiatric/Behavioral: Negative for behavioral problems, dysphoric mood and agitation. The patient is not nervous/anxious.        Objective:   Physical Exam  Constitutional: She is oriented to person, place, and time. She appears well-developed and well-nourished.  HENT:  Head: Normocephalic.  Right Ear: External ear normal.  Left  Ear: External ear normal.  Mouth/Throat: Oropharynx is clear and moist.  Eyes: Conjunctivae and EOM are normal. Pupils are equal, round, and reactive to light.  Neck: Normal range of motion. Neck supple. No thyromegaly present.  Cardiovascular: Normal rate, regular rhythm, normal heart sounds and intact distal pulses.   Pulmonary/Chest: Effort normal and breath sounds normal.  Abdominal: Soft. Bowel sounds are normal. She exhibits no mass. There is no tenderness.  Musculoskeletal: Normal range of motion.  Lymphadenopathy:    She has no cervical adenopathy.  Neurological: She is alert and oriented to person, place, and time.  Skin: Skin is warm and dry. No rash noted.  Psychiatric: She has a normal mood and affect. Her behavior is normal.          Assessment & Plan:   Osteoporosis. We'll continue Fosamax. Calcium and vitamin D supplements will be continued as well as a exercise regimen Polymyalgia rheumatica. We'll check a sedimentation rate today if this remains normal we'll decrease prednisone to 2.5 every other day for 4 weeks and then discontinue  Recheck 6 months

## 2012-11-28 NOTE — Patient Instructions (Addendum)
Take a calcium supplement, plus [phone] units of vitamin D    It is important that you exercise regularly, at least 20 minutes 3 to 4 times per week.  If you develop chest pain or shortness of breath seek  medical attention.  Return in 6 months for follow-up  Decrease prednisone to 2.5 mg every other day for 4 weeks and then discontinue

## 2013-02-11 ENCOUNTER — Encounter: Payer: Self-pay | Admitting: Internal Medicine

## 2013-03-03 ENCOUNTER — Encounter: Payer: Self-pay | Admitting: Internal Medicine

## 2013-03-05 MED ORDER — ALENDRONATE SODIUM 70 MG PO TABS: 70.0000 mg | ORAL_TABLET | ORAL | Status: DC

## 2013-03-05 MED ORDER — OMEPRAZOLE 20 MG PO CPDR: 20.0000 mg | DELAYED_RELEASE_CAPSULE | Freq: Every day | ORAL | Status: DC

## 2013-04-03 ENCOUNTER — Encounter: Payer: Self-pay | Admitting: Internal Medicine

## 2013-04-04 ENCOUNTER — Other Ambulatory Visit: Payer: Self-pay | Admitting: *Deleted

## 2013-04-04 MED ORDER — PREDNISONE 2.5 MG PO TABS: 2.5000 mg | ORAL_TABLET | Freq: Every day | ORAL | Status: DC

## 2013-04-23 ENCOUNTER — Other Ambulatory Visit: Payer: Self-pay

## 2013-04-23 DIAGNOSIS — Z1231 Encounter for screening mammogram for malignant neoplasm of breast: Secondary | ICD-10-CM

## 2013-04-29 DIAGNOSIS — Z23 Encounter for immunization: Secondary | ICD-10-CM | POA: Diagnosis not present

## 2013-05-19 ENCOUNTER — Ambulatory Visit (INDEPENDENT_AMBULATORY_CARE_PROVIDER_SITE_OTHER): Payer: Medicare Other | Admitting: Internal Medicine

## 2013-05-19 ENCOUNTER — Encounter: Payer: Self-pay | Admitting: Internal Medicine

## 2013-05-19 VITALS — BP 110/72 | HR 105 | Temp 100.5°F | Resp 20 | Wt 118.0 lb

## 2013-05-19 DIAGNOSIS — M353 Polymyalgia rheumatica: Secondary | ICD-10-CM

## 2013-05-19 DIAGNOSIS — J069 Acute upper respiratory infection, unspecified: Secondary | ICD-10-CM

## 2013-05-19 DIAGNOSIS — J309 Allergic rhinitis, unspecified: Secondary | ICD-10-CM

## 2013-05-19 MED ORDER — HYDROCODONE-HOMATROPINE 5-1.5 MG/5ML PO SYRP: 5.0000 mL | ORAL_SOLUTION | Freq: Four times a day (QID) | ORAL | Status: AC | PRN

## 2013-05-19 NOTE — Patient Instructions (Signed)
Take [phone] mg of Tylenol every 6 hours as needed for pain relief or fever.  Avoid taking more than 3000 mg in a 24-hour period (  This may cause liver damage).  Acute sinusitis symptoms for less than 10 days are generally not helped by antibiotic therapy.  Use saline irrigation, warm  moist compresses and over-the-counter decongestants only as directed.  Call if there is no improvement in 5 to 7 days, or sooner if you develop increasing pain, fever, or any new symptoms.

## 2013-05-19 NOTE — Progress Notes (Signed)
Subjective:    Patient ID: Savannah Jimenez, female    DOB: 04-24-1936, 77 y.o.   MRN: [id number]  HPI  77 year old patient who has a history of polymyalgia rheumatica controlled on low-dose prednisone therapy as well as a history of allergic rhinitis. She presents with a four-day history of fever sinus congestion drainage productive cough and general sense of wellness.  Past Medical History  Diagnosis Date  . Allergy   . GERD (gastroesophageal reflux disease)   . Osteoporosis     History   Social History  . Marital Status: Married    Spouse Name: N/A    Number of Children: N/A  . Years of Education: N/A   Occupational History  . Not on file.   Social History Main Topics  . Smoking status: Never Smoker   . Smokeless tobacco: Never Used  . Alcohol Use: Yes     Comment: rarely  . Drug Use: No  . Sexual Activity: Not on file   Other Topics Concern  . Not on file   Social History Narrative  . No narrative on file    Past Surgical History  Procedure Laterality Date  . Abdominal hysterectomy    . Tubal ligation    . Colonscopy  2007  . Cataract extraction  10/2011    Left    Family History  Problem Relation Age of Onset  . Heart failure Mother   . Hypertension Sister   . Asthma Sister   . Hypertension Brother     Allergies  Allergen Reactions  . Tetanus Toxoids     Arm swelling   . Penicillins   . Sulfacetamide Sodium     Current Outpatient Prescriptions on File Prior to Visit  Medication Sig Dispense Refill  . alendronate (FOSAMAX) 70 MG tablet Take 1 tablet (70 mg total) by mouth every 7 (seven) days. Take with a full glass of water on an empty stomach.  12 tablet  3  . Calcium Carbonate-Vitamin D (CALCIUM-VITAMIN D) 600-200 MG-UNIT CAPS Take by mouth daily.        . fish oil-omega-3 fatty acids 1000 MG capsule Take 2 g by mouth daily.        . fluticasone (FLONASE) 50 MCG/ACT nasal spray Place 2 sprays into the nose daily.  16 g  6  . methocarbamol  (ROBAXIN) 500 MG tablet Take 500 mg by mouth every 6 (six) hours.       Marland Kitchen omeprazole (PRILOSEC) 20 MG capsule Take 1 capsule (20 mg total) by mouth daily.  90 capsule  3  . predniSONE (DELTASONE) 2.5 MG tablet Take 1 tablet (2.5 mg total) by mouth daily.  90 tablet  3  . zolpidem (AMBIEN) 5 MG tablet Take 1 tablet (5 mg total) by mouth at bedtime as needed.  90 tablet  1   No current facility-administered medications on file prior to visit.    BP 110/72  Pulse 105  Temp(Src) 100.5 F (38.1 C) (Oral)  Resp 20  Wt 118 lb (53.524 kg)  BMI 20.91 kg/m2  SpO2 95%       Review of Systems  Constitutional: Positive for fever, activity change, appetite change and fatigue. Negative for chills and diaphoresis.  HENT: Positive for postnasal drip, rhinorrhea and sinus pressure. Negative for congestion, dental problem, hearing loss, sore throat and tinnitus.   Eyes: Negative for pain, discharge and visual disturbance.  Respiratory: Positive for cough. Negative for shortness of breath.   Cardiovascular: Negative for chest  pain, palpitations and leg swelling.  Gastrointestinal: Negative for nausea, vomiting, abdominal pain, diarrhea, constipation, blood in stool and abdominal distention.  Genitourinary: Negative for dysuria, urgency, frequency, hematuria, flank pain, vaginal bleeding, vaginal discharge, difficulty urinating, vaginal pain and pelvic pain.  Musculoskeletal: Negative for arthralgias, gait problem and joint swelling.  Skin: Negative for rash.  Neurological: Negative for dizziness, syncope, speech difficulty, weakness, numbness and headaches.  Hematological: Negative for adenopathy.  Psychiatric/Behavioral: Negative for behavioral problems, dysphoric mood and agitation. The patient is not nervous/anxious.        Objective:   Physical Exam  Constitutional: She is oriented to person, place, and time. She appears well-developed and well-nourished. No distress.  Appears unwell but in  no acute distress. Temperature 100.5 Pulse rate 100  HENT:  Head: Normocephalic.  Right Ear: External ear normal.  Left Ear: External ear normal.  Mouth/Throat: Oropharynx is clear and moist.  Eyes: Conjunctivae and EOM are normal. Pupils are equal, round, and reactive to light.  Neck: Normal range of motion. Neck supple. No thyromegaly present.  Cardiovascular: Normal rate, regular rhythm, normal heart sounds and intact distal pulses.   Right 100  Pulmonary/Chest: Effort normal and breath sounds normal. No respiratory distress. She has no wheezes. She has no rales. She exhibits no tenderness.  Abdominal: Soft. Bowel sounds are normal. She exhibits no mass. There is no tenderness.  Musculoskeletal: Normal range of motion.  Lymphadenopathy:    She has no cervical adenopathy.  Neurological: She is alert and oriented to person, place, and time.  Skin: Skin is warm and dry. No rash noted.  Psychiatric: She has a normal mood and affect. Her behavior is normal.          Assessment & Plan:   Viral URI with cough. Will treat symptomatically. Acetaminophen for temperature control. Continue fluticasone PMR stable Allergic rhinitis. Continue fluticasone

## 2013-05-20 DIAGNOSIS — L570 Actinic keratosis: Secondary | ICD-10-CM | POA: Diagnosis not present

## 2013-05-20 DIAGNOSIS — L821 Other seborrheic keratosis: Secondary | ICD-10-CM | POA: Diagnosis not present

## 2013-05-20 DIAGNOSIS — Z85828 Personal history of other malignant neoplasm of skin: Secondary | ICD-10-CM | POA: Diagnosis not present

## 2013-05-21 ENCOUNTER — Encounter: Payer: Self-pay | Admitting: Internal Medicine

## 2013-05-22 NOTE — Telephone Encounter (Signed)
Spoke to pt asked her how she was feeling? Pt stated she is doing better is only taking 1/2 tablet of Hydrocodone and is not nauseated anymore. Told pt okay, continue to rest and drink plenty of fluids and call if any problems. Pt verbalized understanding.

## 2013-05-26 ENCOUNTER — Encounter: Payer: Self-pay | Admitting: Internal Medicine

## 2013-05-26 NOTE — Telephone Encounter (Signed)
Please see message and advise 

## 2013-05-29 ENCOUNTER — Other Ambulatory Visit: Payer: Self-pay

## 2013-05-30 ENCOUNTER — Encounter: Payer: Self-pay | Admitting: Internal Medicine

## 2013-05-30 ENCOUNTER — Ambulatory Visit (INDEPENDENT_AMBULATORY_CARE_PROVIDER_SITE_OTHER): Payer: Medicare Other | Admitting: Internal Medicine

## 2013-05-30 VITALS — BP 120/74 | HR 112 | Temp 98.5°F | Resp 18 | Wt 118.0 lb

## 2013-05-30 DIAGNOSIS — R300 Dysuria: Secondary | ICD-10-CM

## 2013-05-30 DIAGNOSIS — R350 Frequency of micturition: Secondary | ICD-10-CM | POA: Diagnosis not present

## 2013-05-30 LAB — POCT URINALYSIS DIPSTICK
Bilirubin, UA: NEGATIVE
Glucose, UA: NEGATIVE
Ketones, UA: NEGATIVE
Nitrite, UA: NEGATIVE
pH, UA: 6.5

## 2013-05-30 MED ORDER — CIPROFLOXACIN HCL 500 MG PO TABS: 500.0000 mg | ORAL_TABLET | Freq: Two times a day (BID) | ORAL | Status: DC

## 2013-05-30 NOTE — Progress Notes (Signed)
Subjective:    Patient ID: Savannah Jimenez, female    DOB: 11-12-1935, 78 y.o.   MRN: [id number]  HPI  77 year old patient who has been seen recently for a viral URI with cough. His symptoms are improving. Yesterday she developed the urinary frequency dysuria. She noted her urine much more cloudy and with a strong odor. No fever or chills or flank pain.  Past Medical History  Diagnosis Date  . Allergy   . GERD (gastroesophageal reflux disease)   . Osteoporosis     History   Social History  . Marital Status: Married    Spouse Name: N/A    Number of Children: N/A  . Years of Education: N/A   Occupational History  . Not on file.   Social History Main Topics  . Smoking status: Never Smoker   . Smokeless tobacco: Never Used  . Alcohol Use: Yes     Comment: rarely  . Drug Use: No  . Sexual Activity: Not on file   Other Topics Concern  . Not on file   Social History Narrative  . No narrative on file    Past Surgical History  Procedure Laterality Date  . Abdominal hysterectomy    . Tubal ligation    . Colonscopy  2007  . Cataract extraction  10/2011    Left    Family History  Problem Relation Age of Onset  . Heart failure Mother   . Hypertension Sister   . Asthma Sister   . Hypertension Brother     Allergies  Allergen Reactions  . Tetanus Toxoids     Arm swelling   . Penicillins   . Sulfacetamide Sodium     Current Outpatient Prescriptions on File Prior to Visit  Medication Sig Dispense Refill  . alendronate (FOSAMAX) 70 MG tablet Take 1 tablet (70 mg total) by mouth every 7 (seven) days. Take with a full glass of water on an empty stomach.  12 tablet  3  . Calcium Carbonate-Vitamin D (CALCIUM-VITAMIN D) 600-200 MG-UNIT CAPS Take by mouth daily.        . fish oil-omega-3 fatty acids 1000 MG capsule Take 2 g by mouth daily.        . fluticasone (FLONASE) 50 MCG/ACT nasal spray Place 2 sprays into the nose daily.  16 g  6  . omeprazole (PRILOSEC) 20 MG capsule  Take 1 capsule (20 mg total) by mouth daily.  90 capsule  3  . predniSONE (DELTASONE) 2.5 MG tablet Take 1 tablet (2.5 mg total) by mouth daily.  90 tablet  3  . zolpidem (AMBIEN) 5 MG tablet Take 1 tablet (5 mg total) by mouth at bedtime as needed.  90 tablet  1   No current facility-administered medications on file prior to visit.    BP 120/74  Pulse 112  Temp(Src) 98.5 F (36.9 C) (Oral)  Resp 18  Wt 118 lb (53.524 kg)  SpO2 96%       Review of Systems  Constitutional: Negative.   HENT: Negative for congestion, dental problem, hearing loss, rhinorrhea, sinus pressure, sore throat and tinnitus.   Eyes: Negative for pain, discharge and visual disturbance.  Respiratory: Positive for cough. Negative for shortness of breath.   Cardiovascular: Negative for chest pain, palpitations and leg swelling.  Gastrointestinal: Negative for nausea, vomiting, abdominal pain, diarrhea, constipation, blood in stool and abdominal distention.  Genitourinary: Positive for dysuria, urgency and frequency. Negative for hematuria, flank pain, vaginal bleeding, vaginal discharge, difficulty  urinating, vaginal pain and pelvic pain.  Musculoskeletal: Negative for arthralgias, gait problem and joint swelling.  Skin: Negative for rash.  Neurological: Negative for dizziness, syncope, speech difficulty, weakness, numbness and headaches.  Hematological: Negative for adenopathy.  Psychiatric/Behavioral: Negative for behavioral problems, dysphoric mood and agitation. The patient is not nervous/anxious.        Objective:   Physical Exam  Constitutional: She appears well-developed and well-nourished. No distress.          Assessment & Plan:  UTI  Will treat with Cipro for 3 days  CPX as scheduled next week

## 2013-05-30 NOTE — Patient Instructions (Signed)
Take your antibiotic as prescribed until ALL of it is gone, but stop if you develop a rash, swelling, or any side effects of the medication.  Contact our office as soon as possible if  there are side effects of the medication. 

## 2013-06-02 ENCOUNTER — Encounter: Payer: Self-pay | Admitting: Internal Medicine

## 2013-06-02 ENCOUNTER — Ambulatory Visit (INDEPENDENT_AMBULATORY_CARE_PROVIDER_SITE_OTHER): Payer: Medicare Other | Admitting: Internal Medicine

## 2013-06-02 VITALS — BP 110/70 | HR 78 | Temp 98.2°F | Resp 18 | Ht 62.75 in | Wt 116.0 lb

## 2013-06-02 DIAGNOSIS — K219 Gastro-esophageal reflux disease without esophagitis: Secondary | ICD-10-CM

## 2013-06-02 DIAGNOSIS — J309 Allergic rhinitis, unspecified: Secondary | ICD-10-CM | POA: Diagnosis not present

## 2013-06-02 DIAGNOSIS — E785 Hyperlipidemia, unspecified: Secondary | ICD-10-CM | POA: Diagnosis not present

## 2013-06-02 DIAGNOSIS — M353 Polymyalgia rheumatica: Secondary | ICD-10-CM | POA: Diagnosis not present

## 2013-06-02 DIAGNOSIS — M810 Age-related osteoporosis without current pathological fracture: Secondary | ICD-10-CM

## 2013-06-02 DIAGNOSIS — Z0000 Encounter for general adult medical examination without abnormal findings: Secondary | ICD-10-CM

## 2013-06-02 DIAGNOSIS — M549 Dorsalgia, unspecified: Secondary | ICD-10-CM | POA: Diagnosis not present

## 2013-06-02 LAB — COMPREHENSIVE METABOLIC PANEL
BUN: 17 mg/dL (ref 6–23)
CO2: 29 mEq/L (ref 19–32)
Calcium: 9.5 mg/dL (ref 8.4–10.5)
Chloride: 102 mEq/L (ref 96–112)
Creatinine, Ser: 0.7 mg/dL (ref 0.4–1.2)
GFR: 89.67 mL/min (ref >60.00)
Glucose, Bld: 99 mg/dL (ref 70–99)
Total Bilirubin: 0.6 mg/dL (ref 0.3–1.2)

## 2013-06-02 LAB — CBC WITH DIFFERENTIAL/PLATELET
Basophils Relative: 0.5 % (ref 0.0–3.0)
Eosinophils Absolute: 0 10*3/uL (ref 0.0–0.7)
Eosinophils Relative: 0.3 % (ref 0.0–5.0)
Hemoglobin: 11.7 g/dL — ABNORMAL LOW (ref 12.0–15.0)
Lymphocytes Relative: 35.3 % (ref 12.0–46.0)
MCHC: 33.3 g/dL (ref 30.0–36.0)
Monocytes Relative: 6.5 % (ref 3.0–12.0)
Neutrophils Relative %: 57.4 % (ref 43.0–77.0)
RBC: 3.85 Mil/uL — ABNORMAL LOW (ref 3.87–5.11)
WBC: 6.7 10*3/uL (ref 4.5–10.5)

## 2013-06-02 LAB — LIPID PANEL
Cholesterol: 187 mg/dL (ref 0–200)
HDL: 46.4 mg/dL (ref >39.00)
Triglycerides: 97 mg/dL (ref 0.0–149.0)

## 2013-06-02 LAB — SEDIMENTATION RATE: Sed Rate: 86 mm/hr — ABNORMAL HIGH (ref 0–22)

## 2013-06-02 NOTE — Progress Notes (Signed)
Subjective:    Patient ID: Savannah Jimenez, female    DOB: [birth year], 77 y.o.   MRN: [id number]  HPI Pre-visit discussion using our clinic review tool. No additional management support is needed unless otherwise documented below in the visit note.  CC: cpx - doing well .   History of Present Illness:   77  year-old patient who is seen today for a comprehensive evaluation. She has a history of osteoporosis, gastroesophageal reflux disease, and allergic rhinitis. She has  been diagnosed with POLYmyalgia rheumatica and remains on low-dose prednisone therapy. She does have a history of osteoporosis and has been treated with Boniva and now Fosamax. She remains on calcium and vitamin D supplements. She is doing well today.   Here for Medicare AWV:   1. Risk factors based on Past M, S, F history: no cardiovascular risk factors  2. Physical Activities: remains quite active physically, including a health club 3 times per week  3. Depression/mood: history depression, or mood disorder  4. Hearing: no hearing deficits  5. ADL's: independent in all aspects of daily living  6. Fall Risk: low  7. Home Safety: no problems identified  8. Height, weight, &visual acuity:height and weight stable. No difficulty with visual acuity  9 Counseling- heart healthy diet regular. Exercise regimen encouraged as well as calcium and vitamin D supplementation  10. Labs ordered based on risk factors: complete laboratory profile including lipid panel will be reviewed  11. Referral Coordination- will have mammogram performed in the spring  12. Care Plan- continue heart healthy diet regular. Exercise. Plan  13. Cognitive Assessment- alert and oriented, with normal affect. No memory disturbance, and Lozol, executive functioning without difficulty   Allergies:  1) ! Penicillin G Pot in Dextrose (Penicillin G Potassium in D5w)  2) ! Sulf-10   Past History:  Past Medical History:   Allergic rhinitis  GERD  Osteoporosis   Polymyalgia rheumatica   Past Surgical History:   Hysterectomy  Tubal ligation  colonoscopy 2007   Family History:   both parents died at age 53. Father from sepsis.  Mother of complications of heart failure  one brother one sister both have hypertension.  Sister on chronic dialysis, SLE, CAD, Asthma   Social History:    Married  Never Smoked  Regular exercise-yes    Past Medical History  Diagnosis Date  . Allergy   . GERD (gastroesophageal reflux disease)   . Osteoporosis     History   Social History  . Marital Status: Married    Spouse Name: N/A    Number of Children: N/A  . Years of Education: N/A   Occupational History  . Not on file.   Social History Main Topics  . Smoking status: Never Smoker   . Smokeless tobacco: Never Used  . Alcohol Use: Yes     Comment: rarely  . Drug Use: No  . Sexual Activity: Not on file   Other Topics Concern  . Not on file   Social History Narrative  . No narrative on file    Past Surgical History  Procedure Laterality Date  . Abdominal hysterectomy    . Tubal ligation    . Colonscopy  2007  . Cataract extraction  10/2011    Left    Family History  Problem Relation Age of Onset  . Heart failure Mother   . Hypertension Sister   . Asthma Sister   . Hypertension Brother     Allergies  Allergen Reactions  .  Tetanus Toxoids     Arm swelling   . Penicillins   . Sulfacetamide Sodium     Current Outpatient Prescriptions on File Prior to Visit  Medication Sig Dispense Refill  . alendronate (FOSAMAX) 70 MG tablet Take 1 tablet (70 mg total) by mouth every 7 (seven) days. Take with a full glass of water on an empty stomach.  12 tablet  3  . Calcium Carbonate-Vitamin D (CALCIUM-VITAMIN D) 600-200 MG-UNIT CAPS Take by mouth daily.        . ciprofloxacin (CIPRO) 500 MG tablet Take 1 tablet (500 mg total) by mouth 2 (two) times daily.  6 tablet  0  . fish oil-omega-3 fatty acids 1000 MG capsule Take 2 g by mouth  daily.        . fluticasone (FLONASE) 50 MCG/ACT nasal spray Place 2 sprays into the nose daily.  16 g  6  . omeprazole (PRILOSEC) 20 MG capsule Take 1 capsule (20 mg total) by mouth daily.  90 capsule  3  . predniSONE (DELTASONE) 2.5 MG tablet Take 1 tablet (2.5 mg total) by mouth daily.  90 tablet  3  . zolpidem (AMBIEN) 5 MG tablet Take 1 tablet (5 mg total) by mouth at bedtime as needed.  90 tablet  1   No current facility-administered medications on file prior to visit.    BP 110/70  Pulse 78  Temp(Src) 98.2 F (36.8 C) (Oral)  Resp 18  Ht 5' 2.75" (1.594 m)  Wt 116 lb (52.617 kg)  BMI 20.71 kg/m2  SpO2 95%      Review of Systems  Constitutional: Negative for fever, appetite change, fatigue and unexpected weight change.  HENT: Negative for congestion, dental problem, ear pain, hearing loss, mouth sores, nosebleeds, sinus pressure, sore throat, tinnitus, trouble swallowing and voice change.   Eyes: Negative for photophobia, pain, redness and visual disturbance.  Respiratory: Negative for cough, chest tightness and shortness of breath.   Cardiovascular: Negative for chest pain, palpitations and leg swelling.  Gastrointestinal: Negative for nausea, vomiting, abdominal pain, diarrhea, constipation, blood in stool, abdominal distention and rectal pain.  Genitourinary: Negative for dysuria, urgency, frequency, hematuria, flank pain, vaginal bleeding, vaginal discharge, difficulty urinating, genital sores, vaginal pain, menstrual problem and pelvic pain.  Musculoskeletal: Negative for arthralgias, back pain and neck stiffness.  Skin: Negative for rash.  Neurological: Negative for dizziness, syncope, speech difficulty, weakness, light-headedness, numbness and headaches.  Hematological: Negative for adenopathy. Does not bruise/bleed easily.  Psychiatric/Behavioral: Negative for suicidal ideas, behavioral problems, self-injury, dysphoric mood and agitation. The patient is not  nervous/anxious.        Objective:   Physical Exam  Constitutional: She is oriented to person, place, and time. She appears well-developed and well-nourished.  HENT:  Head: Normocephalic and atraumatic.  Right Ear: External ear normal.  Left Ear: External ear normal.  Mouth/Throat: Oropharynx is clear and moist.  Eyes: Conjunctivae and EOM are normal.  Neck: Normal range of motion. Neck supple. No JVD present. No thyromegaly present.  Cardiovascular: Normal rate, regular rhythm, normal heart sounds and intact distal pulses.   No murmur heard. Pulmonary/Chest: Effort normal and breath sounds normal. She has no wheezes. She has no rales.  Abdominal: Soft. Bowel sounds are normal. She exhibits no distension and no mass. There is no tenderness. There is no rebound and no guarding.  Lower midline scar  Genitourinary: Vagina normal.  Musculoskeletal: Normal range of motion. She exhibits no edema and no tenderness.  Neurological:  She is alert and oriented to person, place, and time. She has normal reflexes. No cranial nerve deficit. She exhibits normal muscle tone. Coordination normal.  Skin: Skin is warm and dry. No rash noted.  Psychiatric: She has a normal mood and affect. Her behavior is normal.          Assessment & Plan   Preventative health examination Osteoporosis. We'll continue calcium and vitamin D supplements. We'll check a bone density in one year Polymyalgia rheumatica. Check a sedimentation rate Gastroesophageal reflux disease. Stable on present regimen

## 2013-06-02 NOTE — Patient Instructions (Signed)
It is important that you exercise regularly, at least 20 minutes 3 to 4 times per week.  If you develop chest pain or shortness of breath seek  medical attention.  Return in 6 months for follow-up  Take a calcium supplement, plus [phone] units of vitamin D

## 2013-06-02 NOTE — Progress Notes (Signed)
Pre-visit discussion using our clinic review tool. No additional management support is needed unless otherwise documented below in the visit note.  

## 2013-06-11 ENCOUNTER — Encounter: Payer: Self-pay | Admitting: Internal Medicine

## 2013-06-25 ENCOUNTER — Other Ambulatory Visit: Payer: Self-pay | Admitting: Internal Medicine

## 2013-07-21 DIAGNOSIS — H43819 Vitreous degeneration, unspecified eye: Secondary | ICD-10-CM | POA: Diagnosis not present

## 2013-07-21 DIAGNOSIS — Z961 Presence of intraocular lens: Secondary | ICD-10-CM | POA: Diagnosis not present

## 2013-07-21 DIAGNOSIS — H35379 Puckering of macula, unspecified eye: Secondary | ICD-10-CM | POA: Diagnosis not present

## 2013-07-21 DIAGNOSIS — H40019 Open angle with borderline findings, low risk, unspecified eye: Secondary | ICD-10-CM | POA: Diagnosis not present

## 2013-07-21 DIAGNOSIS — H04129 Dry eye syndrome of unspecified lacrimal gland: Secondary | ICD-10-CM | POA: Diagnosis not present

## 2013-07-21 DIAGNOSIS — H353190 Nonexudative age-related macular degeneration, unspecified eye, stage unspecified: Secondary | ICD-10-CM | POA: Diagnosis not present

## 2013-07-21 DIAGNOSIS — H43829 Vitreomacular adhesion, unspecified eye: Secondary | ICD-10-CM | POA: Diagnosis not present

## 2013-07-30 ENCOUNTER — Other Ambulatory Visit (INDEPENDENT_AMBULATORY_CARE_PROVIDER_SITE_OTHER): Payer: Medicare Other

## 2013-07-30 DIAGNOSIS — M549 Dorsalgia, unspecified: Secondary | ICD-10-CM | POA: Diagnosis not present

## 2013-07-30 LAB — SEDIMENTATION RATE: SED RATE: 29 mm/h — AB (ref 0–22)

## 2013-08-01 ENCOUNTER — Encounter (INDEPENDENT_AMBULATORY_CARE_PROVIDER_SITE_OTHER): Payer: Medicare Other | Admitting: Ophthalmology

## 2013-08-01 DIAGNOSIS — H35379 Puckering of macula, unspecified eye: Secondary | ICD-10-CM | POA: Diagnosis not present

## 2013-08-01 DIAGNOSIS — H43819 Vitreous degeneration, unspecified eye: Secondary | ICD-10-CM | POA: Diagnosis not present

## 2013-08-01 DIAGNOSIS — H3530 Unspecified macular degeneration: Secondary | ICD-10-CM | POA: Diagnosis not present

## 2013-08-08 ENCOUNTER — Encounter: Payer: Self-pay | Admitting: Internal Medicine

## 2013-08-08 ENCOUNTER — Other Ambulatory Visit: Payer: Self-pay | Admitting: *Deleted

## 2013-08-08 MED ORDER — PREDNISONE 5 MG PO TABS: 5.0000 mg | ORAL_TABLET | Freq: Every day | ORAL | Status: DC

## 2013-09-08 ENCOUNTER — Encounter: Payer: Self-pay | Admitting: Internal Medicine

## 2013-09-08 ENCOUNTER — Other Ambulatory Visit: Payer: Self-pay | Admitting: *Deleted

## 2013-09-08 MED ORDER — FLUTICASONE PROPIONATE 50 MCG/ACT NA SUSP: 2.0000 | Freq: Every day | NASAL | Status: DC

## 2013-10-14 ENCOUNTER — Ambulatory Visit
Admission: RE | Admit: 2013-10-14 | Discharge: 2013-10-14 | Disposition: A | Discharge status: Home / Self Care | Payer: Medicare Other | Source: Ambulatory Visit

## 2013-10-14 ENCOUNTER — Other Ambulatory Visit: Payer: Self-pay

## 2013-10-14 ENCOUNTER — Ambulatory Visit: Payer: Medicare Other

## 2013-10-14 DIAGNOSIS — Z1231 Encounter for screening mammogram for malignant neoplasm of breast: Secondary | ICD-10-CM | POA: Diagnosis not present

## 2013-11-03 ENCOUNTER — Encounter: Payer: Self-pay | Admitting: Internal Medicine

## 2013-11-04 ENCOUNTER — Encounter: Payer: Self-pay | Admitting: Internal Medicine

## 2013-11-04 ENCOUNTER — Ambulatory Visit (INDEPENDENT_AMBULATORY_CARE_PROVIDER_SITE_OTHER): Payer: Medicare Other | Admitting: Internal Medicine

## 2013-11-04 VITALS — BP 120/62 | HR 92 | Temp 98.9°F | Resp 18 | Ht 62.75 in | Wt 115.0 lb

## 2013-11-04 DIAGNOSIS — M353 Polymyalgia rheumatica: Secondary | ICD-10-CM | POA: Diagnosis not present

## 2013-11-04 DIAGNOSIS — J309 Allergic rhinitis, unspecified: Secondary | ICD-10-CM | POA: Diagnosis not present

## 2013-11-04 NOTE — Progress Notes (Signed)
Poly  Subjective:    Patient ID: Savannah Jimenez, female    DOB: 1935/09/15, 78 y.o.   MRN: [id number]  HPI   78 year old patient who has a history of allergic rhinitis.  She also has a history of polymyalgia rheumatica.  For the past 2, days she's had increasing sinus pressure, and bilateral maxillary sinus discomfort.  She's had increase in rhinorrhea, which at time has been slightly yellowish.  Denies any fever She does have a history of PMR and is on low-dose chronic prednisone therapy  Past Medical History  Diagnosis Date  . Allergy   . GERD (gastroesophageal reflux disease)   . Osteoporosis     History   Social History  . Marital Status: Married    Spouse Name: N/A    Number of Children: N/A  . Years of Education: N/A   Occupational History  . Not on file.   Social History Main Topics  . Smoking status: Never Smoker   . Smokeless tobacco: Never Used  . Alcohol Use: Yes     Comment: rarely  . Drug Use: No  . Sexual Activity: Not on file   Other Topics Concern  . Not on file   Social History Narrative  . No narrative on file    Past Surgical History  Procedure Laterality Date  . Abdominal hysterectomy    . Tubal ligation    . Colonscopy  2007  . Cataract extraction  10/2011    Left    Family History  Problem Relation Age of Onset  . Heart failure Mother   . Hypertension Sister   . Asthma Sister   . Hypertension Brother     Allergies  Allergen Reactions  . Tetanus Toxoids     Arm swelling   . Penicillins   . Sulfacetamide Sodium     Current Outpatient Prescriptions on File Prior to Visit  Medication Sig Dispense Refill  . alendronate (FOSAMAX) 70 MG tablet Take 1 tablet (70 mg total) by mouth every 7 (seven) days. Take with a full glass of water on an empty stomach.  12 tablet  3  . Calcium Carbonate-Vitamin D (CALCIUM-VITAMIN D) 600-200 MG-UNIT CAPS Take by mouth daily.        . fish oil-omega-3 fatty acids 1000 MG capsule Take 2 g by mouth daily.         . fluticasone (FLONASE) 50 MCG/ACT nasal spray Place 2 sprays into both nostrils daily.  16 g  6  . omeprazole (PRILOSEC) 20 MG capsule Take 1 capsule (20 mg total) by mouth daily.  90 capsule  3  . zolpidem (AMBIEN) 5 MG tablet Take 1 tablet (5 mg total) by mouth at bedtime as needed.  90 tablet  1   No current facility-administered medications on file prior to visit.    BP 120/62  Pulse 92  Temp(Src) 98.9 F (37.2 C) (Oral)  Resp 18  Ht 5' 2.75" (1.594 m)  Wt 115 lb (52.164 kg)  BMI 20.53 kg/m2  SpO2 96%     Review of Systems  Constitutional: Negative.   HENT: Positive for congestion, postnasal drip, rhinorrhea and sinus pressure. Negative for dental problem, hearing loss, sore throat and tinnitus.   Eyes: Negative for pain, discharge and visual disturbance.  Respiratory: Negative for cough and shortness of breath.   Cardiovascular: Negative for chest pain, palpitations and leg swelling.  Gastrointestinal: Negative for nausea, vomiting, abdominal pain, diarrhea, constipation, blood in stool and abdominal distention.  Genitourinary:  Negative for dysuria, urgency, frequency, hematuria, flank pain, vaginal bleeding, vaginal discharge, difficulty urinating, vaginal pain and pelvic pain.  Musculoskeletal: Negative for arthralgias, gait problem and joint swelling.  Skin: Negative for rash.  Neurological: Negative for dizziness, syncope, speech difficulty, weakness, numbness and headaches.  Hematological: Negative for adenopathy.  Psychiatric/Behavioral: Negative for behavioral problems, dysphoric mood and agitation. The patient is not nervous/anxious.        Objective:   Physical Exam  Constitutional: She is oriented to person, place, and time. She appears well-developed and well-nourished.  Afebrile  HENT:  Head: Normocephalic.  Right Ear: External ear normal.  Left Ear: External ear normal.  Mouth/Throat: Oropharynx is clear and moist.  No focal sinus tenderness    Eyes: Conjunctivae and EOM are normal. Pupils are equal, round, and reactive to light.  Neck: Normal range of motion. Neck supple. No thyromegaly present.  Cardiovascular: Normal rate, regular rhythm, normal heart sounds and intact distal pulses.   Pulmonary/Chest: Effort normal and breath sounds normal.  Abdominal: Soft. Bowel sounds are normal. She exhibits no mass. There is no tenderness.  Musculoskeletal: Normal range of motion.  Lymphadenopathy:    She has no cervical adenopathy.  Neurological: She is alert and oriented to person, place, and time.  Skin: Skin is warm and dry. No rash noted.  Psychiatric: She has a normal mood and affect. Her behavior is normal.          Assessment & Plan:   Flare of allergic rhinitis.  Will add decongestants and expectorants to her regimen and increase prednisone dose short-term PMR  Check ESR in 6 wks

## 2013-11-04 NOTE — Progress Notes (Signed)
Pre-visit discussion using our clinic review tool. No additional management support is needed unless otherwise documented below in the visit note.  

## 2013-11-04 NOTE — Patient Instructions (Signed)
Acute sinusitis symptoms for less than 10 days are generally not helped by antibiotic therapy.  Use saline irrigation, warm  moist compresses and over-the-counter decongestants only as directed.  Call if there is no improvement in 5 to 7 days, or sooner if you develop increasing pain, fever, or any new symptoms.  Increase prednisone to 2 tablets twice daily for 7 days, then resume one daily,

## 2013-11-10 ENCOUNTER — Encounter: Payer: Self-pay | Admitting: Internal Medicine

## 2013-11-10 MED ORDER — DOXYCYCLINE HYCLATE 100 MG PO TABS: 100.0000 mg | ORAL_TABLET | Freq: Two times a day (BID) | ORAL | Status: DC

## 2013-11-10 NOTE — Telephone Encounter (Signed)
Please call in a prescription for doxycycline 100 mg, #20 one twice a day (please confirm. She is not allergic to doxycycline)

## 2013-11-19 DIAGNOSIS — Z85828 Personal history of other malignant neoplasm of skin: Secondary | ICD-10-CM | POA: Diagnosis not present

## 2013-11-19 DIAGNOSIS — D239 Other benign neoplasm of skin, unspecified: Secondary | ICD-10-CM | POA: Diagnosis not present

## 2013-11-19 DIAGNOSIS — L570 Actinic keratosis: Secondary | ICD-10-CM | POA: Diagnosis not present

## 2013-11-19 DIAGNOSIS — L820 Inflamed seborrheic keratosis: Secondary | ICD-10-CM | POA: Diagnosis not present

## 2013-12-02 ENCOUNTER — Ambulatory Visit: Payer: Medicare Other | Admitting: Internal Medicine

## 2013-12-23 ENCOUNTER — Other Ambulatory Visit: Payer: Self-pay | Admitting: *Deleted

## 2013-12-23 ENCOUNTER — Encounter: Payer: Self-pay | Admitting: Internal Medicine

## 2013-12-23 ENCOUNTER — Ambulatory Visit (INDEPENDENT_AMBULATORY_CARE_PROVIDER_SITE_OTHER): Payer: Medicare Other | Admitting: Internal Medicine

## 2013-12-23 VITALS — BP 130/80 | HR 84 | Temp 98.4°F | Resp 18 | Ht 62.75 in | Wt 114.0 lb

## 2013-12-23 DIAGNOSIS — M353 Polymyalgia rheumatica: Secondary | ICD-10-CM

## 2013-12-23 DIAGNOSIS — J309 Allergic rhinitis, unspecified: Secondary | ICD-10-CM | POA: Diagnosis not present

## 2013-12-23 LAB — SEDIMENTATION RATE: Sed Rate: 33 mm/hr — ABNORMAL HIGH (ref 0–22)

## 2013-12-23 MED ORDER — PREDNISONE 5 MG PO TABS: 5.0000 mg | ORAL_TABLET | Freq: Every day | ORAL | Status: DC

## 2013-12-23 NOTE — Progress Notes (Signed)
Subjective:    Patient ID: Savannah Jimenez, female    DOB: April 19, 1936, 78 y.o.   MRN: [id number]  HPI  74 -year-old patient who is seen today in followup.  She has PMR and presently is on prednisone 5 mg daily.  Has noticed some mild nonspecific fatigue, but otherwise doing quite well.  She has had some AR symptoms  Past Medical History  Diagnosis Date  . Allergy   . GERD (gastroesophageal reflux disease)   . Osteoporosis     History   Social History  . Marital Status: Married    Spouse Name: N/A    Number of Children: N/A  . Years of Education: N/A   Occupational History  . Not on file.   Social History Main Topics  . Smoking status: Never Smoker   . Smokeless tobacco: Never Used  . Alcohol Use: Yes     Comment: rarely  . Drug Use: No  . Sexual Activity: Not on file   Other Topics Concern  . Not on file   Social History Narrative  . No narrative on file    Past Surgical History  Procedure Laterality Date  . Abdominal hysterectomy    . Tubal ligation    . Colonscopy  2007  . Cataract extraction  10/2011    Left    Family History  Problem Relation Age of Onset  . Heart failure Mother   . Hypertension Sister   . Asthma Sister   . Hypertension Brother     Allergies  Allergen Reactions  . Tetanus Toxoids     Arm swelling   . Penicillins   . Sulfacetamide Sodium     Current Outpatient Prescriptions on File Prior to Visit  Medication Sig Dispense Refill  . alendronate (FOSAMAX) 70 MG tablet Take 1 tablet (70 mg total) by mouth every 7 (seven) days. Take with a full glass of water on an empty stomach.  12 tablet  3  . Calcium Carbonate-Vitamin D (CALCIUM-VITAMIN D) 600-200 MG-UNIT CAPS Take by mouth daily.        Marland Kitchen doxycycline (VIBRA-TABS) 100 MG tablet Take 1 tablet (100 mg total) by mouth 2 (two) times daily.  20 tablet  0  . fish oil-omega-3 fatty acids 1000 MG capsule Take 2 g by mouth daily.        . fluticasone (FLONASE) 50 MCG/ACT nasal spray Place  2 sprays into both nostrils daily.  16 g  6  . omeprazole (PRILOSEC) 20 MG capsule Take 1 capsule (20 mg total) by mouth daily.  90 capsule  3  . zolpidem (AMBIEN) 5 MG tablet Take 1 tablet (5 mg total) by mouth at bedtime as needed.  90 tablet  1   No current facility-administered medications on file prior to visit.    BP 130/80  Pulse 84  Temp(Src) 98.4 F (36.9 C) (Oral)  Resp 18  Ht 5' 2.75" (1.594 m)  Wt 114 lb (51.71 kg)  BMI 20.35 kg/m2  SpO2 96%      Review of Systems  Constitutional: Negative.   HENT: Positive for sinus pressure and sneezing. Negative for congestion, dental problem, hearing loss, rhinorrhea, sore throat and tinnitus.   Eyes: Positive for redness. Negative for pain, discharge and visual disturbance.  Respiratory: Positive for cough. Negative for shortness of breath.   Cardiovascular: Negative for chest pain, palpitations and leg swelling.  Gastrointestinal: Negative for nausea, vomiting, abdominal pain, diarrhea, constipation, blood in stool and abdominal distention.  Genitourinary: Negative for dysuria, urgency, frequency, hematuria, flank pain, vaginal bleeding, vaginal discharge, difficulty urinating, vaginal pain and pelvic pain.  Musculoskeletal: Negative for arthralgias, gait problem and joint swelling.  Skin: Negative for rash.  Neurological: Negative for dizziness, syncope, speech difficulty, weakness, numbness and headaches.  Hematological: Negative for adenopathy.  Psychiatric/Behavioral: Negative for behavioral problems, dysphoric mood and agitation. The patient is not nervous/anxious.        Objective:   Physical Exam  Constitutional: She is oriented to person, place, and time. She appears well-developed and well-nourished.  HENT:  Head: Normocephalic.  Right Ear: External ear normal.  Left Ear: External ear normal.  Mouth/Throat: Oropharynx is clear and moist.  Eyes: Conjunctivae and EOM are normal. Pupils are equal, round, and  reactive to light.  Neck: Normal range of motion. Neck supple. No thyromegaly present.  Cardiovascular: Normal rate, regular rhythm, normal heart sounds and intact distal pulses.   Pulmonary/Chest: Effort normal and breath sounds normal.  Abdominal: Soft. Bowel sounds are normal. She exhibits no mass. There is no tenderness.  Musculoskeletal: Normal range of motion.  Lymphadenopathy:    She has no cervical adenopathy.  Neurological: She is alert and oriented to person, place, and time.  Skin: Skin is warm and dry. No rash noted.  Psychiatric: She has a normal mood and affect. Her behavior is normal.          Assessment & Plan:   PMR.  We'll check a sedimentation rate if this is normal.  Will reduce prednisone to 5 mg every other day alternating with 2 point 5 mg every other day Allergic rhinitis

## 2013-12-23 NOTE — Patient Instructions (Signed)
It is important that you exercise regularly, at least 20 minutes 3 to 4 times per week.  If you develop chest pain or shortness of breath seek  medical attention.  Take a calcium supplement, plus 800-1200 units of vitamin D  Return in 4 months for follow-up  

## 2013-12-23 NOTE — Progress Notes (Signed)
Pre-visit discussion using our clinic review tool. No additional management support is needed unless otherwise documented below in the visit note.  

## 2013-12-24 DIAGNOSIS — H40019 Open angle with borderline findings, low risk, unspecified eye: Secondary | ICD-10-CM | POA: Diagnosis not present

## 2013-12-30 ENCOUNTER — Ambulatory Visit: Payer: Medicare Other | Admitting: Internal Medicine

## 2014-02-02 ENCOUNTER — Ambulatory Visit (INDEPENDENT_AMBULATORY_CARE_PROVIDER_SITE_OTHER): Payer: Medicare Other | Admitting: Ophthalmology

## 2014-02-02 DIAGNOSIS — H35349 Macular cyst, hole, or pseudohole, unspecified eye: Secondary | ICD-10-CM | POA: Diagnosis not present

## 2014-02-02 DIAGNOSIS — H43819 Vitreous degeneration, unspecified eye: Secondary | ICD-10-CM

## 2014-02-02 DIAGNOSIS — H3530 Unspecified macular degeneration: Secondary | ICD-10-CM | POA: Diagnosis not present

## 2014-02-02 DIAGNOSIS — H43829 Vitreomacular adhesion, unspecified eye: Secondary | ICD-10-CM | POA: Diagnosis not present

## 2014-03-17 ENCOUNTER — Encounter: Payer: Self-pay | Admitting: Internal Medicine

## 2014-03-17 MED ORDER — ALENDRONATE SODIUM 70 MG PO TABS: 70.0000 mg | ORAL_TABLET | ORAL | Status: DC

## 2014-03-17 MED ORDER — OMEPRAZOLE 20 MG PO CPDR: 20.0000 mg | DELAYED_RELEASE_CAPSULE | Freq: Every day | ORAL | Status: DC

## 2014-03-17 NOTE — Addendum Note (Signed)
Addended by: Marian Sorrow on: 03/17/2014 01:34 PM   Modules accepted: Orders

## 2014-04-23 ENCOUNTER — Encounter: Payer: Self-pay | Admitting: Internal Medicine

## 2014-04-23 ENCOUNTER — Ambulatory Visit (INDEPENDENT_AMBULATORY_CARE_PROVIDER_SITE_OTHER): Payer: Medicare Other | Admitting: Internal Medicine

## 2014-04-23 VITALS — BP 106/70 | HR 100 | Temp 98.2°F | Resp 18 | Ht 62.75 in | Wt 117.0 lb

## 2014-04-23 DIAGNOSIS — M810 Age-related osteoporosis without current pathological fracture: Secondary | ICD-10-CM | POA: Diagnosis not present

## 2014-04-23 DIAGNOSIS — M545 Low back pain, unspecified: Secondary | ICD-10-CM

## 2014-04-23 DIAGNOSIS — M353 Polymyalgia rheumatica: Secondary | ICD-10-CM

## 2014-04-23 LAB — SEDIMENTATION RATE: Sed Rate: 23 mm/hr — ABNORMAL HIGH (ref 0–22)

## 2014-04-23 NOTE — Progress Notes (Signed)
Pre visit review using our clinic review tool, if applicable. No additional management support is needed unless otherwise documented below in the visit note. 

## 2014-04-23 NOTE — Progress Notes (Signed)
Subjective:    Patient ID: Savannah Jimenez, female    DOB: 01/05/1936, 78 y.o.   MRN: [id number]  HPI 78 year old patient who is seen today for followup of PMR.  She continues to do well.  She presented with weight loss, anorexia, and proximal muscle weakness. She has some nonspecific occasional temporal headaches and was concerned about TA.  Apparently, her father had temporal arteritis.  Past Medical History  Diagnosis Date  . Allergy   . GERD (gastroesophageal reflux disease)   . Osteoporosis     History   Social History  . Marital Status: Married    Spouse Name: N/A    Number of Children: N/A  . Years of Education: N/A   Occupational History  . Not on file.   Social History Main Topics  . Smoking status: Never Smoker   . Smokeless tobacco: Never Used  . Alcohol Use: Yes     Comment: rarely  . Drug Use: No  . Sexual Activity: Not on file   Other Topics Concern  . Not on file   Social History Narrative  . No narrative on file    Past Surgical History  Procedure Laterality Date  . Abdominal hysterectomy    . Tubal ligation    . Colonscopy  2007  . Cataract extraction  10/2011    Left    Family History  Problem Relation Age of Onset  . Heart failure Mother   . Hypertension Sister   . Asthma Sister   . Hypertension Brother     Allergies  Allergen Reactions  . Tetanus Toxoids     Arm swelling   . Penicillins   . Sulfacetamide Sodium     Current Outpatient Prescriptions on File Prior to Visit  Medication Sig Dispense Refill  . alendronate (FOSAMAX) 70 MG tablet Take 1 tablet (70 mg total) by mouth every 7 (seven) days. Take with a full glass of water on an empty stomach.  12 tablet  3  . Calcium Carbonate-Vitamin D (CALCIUM-VITAMIN D) 600-200 MG-UNIT CAPS Take by mouth daily.        Marland Kitchen doxycycline (VIBRA-TABS) 100 MG tablet Take 1 tablet (100 mg total) by mouth 2 (two) times daily.  20 tablet  0  . fish oil-omega-3 fatty acids 1000 MG capsule Take 2 g  by mouth daily.        . fluticasone (FLONASE) 50 MCG/ACT nasal spray Place 2 sprays into both nostrils daily.  16 g  6  . Melatonin 3 MG TABS Take 1 tablet by mouth at bedtime.      Marland Kitchen omeprazole (PRILOSEC) 20 MG capsule Take 1 capsule (20 mg total) by mouth daily.  90 capsule  3  . predniSONE (DELTASONE) 5 MG tablet Take 1 tablet (5 mg total) by mouth daily with breakfast.  90 tablet  3  . zolpidem (AMBIEN) 5 MG tablet Take 1 tablet (5 mg total) by mouth at bedtime as needed.  90 tablet  1   No current facility-administered medications on file prior to visit.    BP 106/70  Pulse 100  Temp(Src) 98.2 F (36.8 C) (Oral)  Resp 18  Ht 5' 2.75" (1.594 m)  Wt 117 lb (53.071 kg)  BMI 20.89 kg/m2  SpO2 98%       Review of Systems  Constitutional: Negative.   HENT: Negative for congestion, dental problem, hearing loss, rhinorrhea, sinus pressure, sore throat and tinnitus.   Eyes: Negative for pain, discharge and visual  disturbance.  Respiratory: Negative for cough and shortness of breath.   Cardiovascular: Negative for chest pain, palpitations and leg swelling.  Gastrointestinal: Negative for nausea, vomiting, abdominal pain, diarrhea, constipation, blood in stool and abdominal distention.  Genitourinary: Negative for dysuria, urgency, frequency, hematuria, flank pain, vaginal bleeding, vaginal discharge, difficulty urinating, vaginal pain and pelvic pain.  Musculoskeletal: Positive for back pain. Negative for arthralgias, gait problem and joint swelling.  Skin: Negative for rash.  Neurological: Positive for headaches. Negative for dizziness, syncope, speech difficulty, weakness and numbness.  Hematological: Negative for adenopathy.  Psychiatric/Behavioral: Negative for behavioral problems, dysphoric mood and agitation. The patient is not nervous/anxious.        Objective:   Physical Exam  Constitutional: She is oriented to person, place, and time. She appears well-developed and  well-nourished.  HENT:  Head: Normocephalic.  Right Ear: External ear normal.  Left Ear: External ear normal.  Mouth/Throat: Oropharynx is clear and moist.  Normal temporal arteries  Eyes: Conjunctivae and EOM are normal. Pupils are equal, round, and reactive to light.  Neck: Normal range of motion. Neck supple. No thyromegaly present.  Cardiovascular: Normal rate, regular rhythm, normal heart sounds and intact distal pulses.   Pulmonary/Chest: Effort normal and breath sounds normal.  Abdominal: Soft. Bowel sounds are normal. She exhibits no mass. There is no tenderness.  Musculoskeletal: Normal range of motion.  Lymphadenopathy:    She has no cervical adenopathy.  Neurological: She is alert and oriented to person, place, and time.  Skin: Skin is warm and dry. No rash noted.  Psychiatric: She has a normal mood and affect. Her behavior is normal.          Assessment & Plan:   Polymyalgia rheumatica.  We'll check a sedimentation rate, and hopefully down titrated prednisone therapy History of osteoporosis Low back pain musculoligamentous

## 2014-04-23 NOTE — Patient Instructions (Signed)
Take a calcium supplement, plus 800-1200 units of vitamin D    It is important that you exercise regularly, at least 20 minutes 3 to 4 times per week.  If you develop chest pain or shortness of breath seek  medical attention.   

## 2014-04-30 DIAGNOSIS — Z23 Encounter for immunization: Secondary | ICD-10-CM | POA: Diagnosis not present

## 2014-05-07 ENCOUNTER — Other Ambulatory Visit: Payer: Self-pay | Admitting: Internal Medicine

## 2014-05-07 DIAGNOSIS — Z1231 Encounter for screening mammogram for malignant neoplasm of breast: Secondary | ICD-10-CM

## 2014-06-03 ENCOUNTER — Ambulatory Visit (INDEPENDENT_AMBULATORY_CARE_PROVIDER_SITE_OTHER): Payer: Medicare Other | Admitting: Internal Medicine

## 2014-06-03 ENCOUNTER — Encounter: Payer: Self-pay | Admitting: Internal Medicine

## 2014-06-03 ENCOUNTER — Encounter: Payer: Self-pay | Admitting: *Deleted

## 2014-06-03 DIAGNOSIS — E785 Hyperlipidemia, unspecified: Secondary | ICD-10-CM | POA: Diagnosis not present

## 2014-06-03 DIAGNOSIS — J3089 Other allergic rhinitis: Secondary | ICD-10-CM | POA: Diagnosis not present

## 2014-06-03 DIAGNOSIS — Z0000 Encounter for general adult medical examination without abnormal findings: Secondary | ICD-10-CM | POA: Diagnosis not present

## 2014-06-03 DIAGNOSIS — M353 Polymyalgia rheumatica: Secondary | ICD-10-CM

## 2014-06-03 DIAGNOSIS — K219 Gastro-esophageal reflux disease without esophagitis: Secondary | ICD-10-CM

## 2014-06-03 DIAGNOSIS — M549 Dorsalgia, unspecified: Secondary | ICD-10-CM | POA: Diagnosis not present

## 2014-06-03 DIAGNOSIS — M810 Age-related osteoporosis without current pathological fracture: Secondary | ICD-10-CM | POA: Diagnosis not present

## 2014-06-03 DIAGNOSIS — Z23 Encounter for immunization: Secondary | ICD-10-CM | POA: Diagnosis not present

## 2014-06-03 LAB — CBC WITH DIFFERENTIAL/PLATELET
BASOS ABS: 0 10*3/uL (ref 0.0–0.1)
BASOS PCT: 0.4 % (ref 0.0–3.0)
EOS PCT: 1.1 % (ref 0.0–5.0)
Eosinophils Absolute: 0.1 10*3/uL (ref 0.0–0.7)
HEMATOCRIT: 38.7 % (ref 36.0–46.0)
HEMOGLOBIN: 12.7 g/dL (ref 12.0–15.0)
LYMPHS ABS: 2 10*3/uL (ref 0.7–4.0)
LYMPHS PCT: 39.3 % (ref 12.0–46.0)
MCHC: 32.9 g/dL (ref 30.0–36.0)
MCV: 94 fl (ref 78.0–100.0)
MONOS PCT: 7.7 % (ref 3.0–12.0)
Monocytes Absolute: 0.4 10*3/uL (ref 0.1–1.0)
NEUTROS ABS: 2.6 10*3/uL (ref 1.4–7.7)
Neutrophils Relative %: 51.5 % (ref 43.0–77.0)
Platelets: 211 10*3/uL (ref 150.0–400.0)
RBC: 4.12 Mil/uL (ref 3.87–5.11)
RDW: 13.9 % (ref 11.5–15.5)
WBC: 5 10*3/uL (ref 4.0–10.5)

## 2014-06-03 LAB — COMPREHENSIVE METABOLIC PANEL
ALK PHOS: 46 U/L (ref 39–117)
ALT: 19 U/L (ref 0–35)
AST: 23 U/L (ref 0–37)
Albumin: 3.5 g/dL (ref 3.5–5.2)
BILIRUBIN TOTAL: 0.8 mg/dL (ref 0.2–1.2)
BUN: 20 mg/dL (ref 6–23)
CO2: 29 mEq/L (ref 19–32)
CREATININE: 0.6 mg/dL (ref 0.4–1.2)
Calcium: 9.5 mg/dL (ref 8.4–10.5)
Chloride: 106 mEq/L (ref 96–112)
GFR: 107.45 mL/min (ref >60.00)
GLUCOSE: 89 mg/dL (ref 70–99)
Potassium: 4.7 mEq/L (ref 3.5–5.1)
SODIUM: 143 meq/L (ref 135–145)
TOTAL PROTEIN: 6.7 g/dL (ref 6.0–8.3)

## 2014-06-03 LAB — LIPID PANEL
CHOLESTEROL: 260 mg/dL — AB (ref 0–200)
HDL: 65.1 mg/dL (ref >39.00)
LDL CALC: 172 mg/dL — AB (ref 0–99)
NONHDL: 194.9
Total CHOL/HDL Ratio: 4
Triglycerides: 114 mg/dL (ref 0.0–149.0)
VLDL: 22.8 mg/dL (ref 0.0–40.0)

## 2014-06-03 LAB — TSH: TSH: 2.49 u[IU]/mL (ref 0.35–4.50)

## 2014-06-03 LAB — SEDIMENTATION RATE: SED RATE: 29 mm/h — AB (ref 0–22)

## 2014-06-03 NOTE — Progress Notes (Signed)
Subjective:    Patient ID: Savannah Jimenez, female    DOB: Apr 24, 1936, 78 y.o.   MRN: [id number]  HPI Pre-visit discussion using our clinic review tool. No additional management support is needed unless otherwise documented below in the visit note.  CC: cpx - doing well .   History of Present Illness:   78  year-old patient who is seen today for a comprehensive evaluation.  She has a history of osteoporosis, gastroesophageal reflux disease, and allergic rhinitis. She has  been diagnosed with POLYmyalgia rheumatica and remains on low-dose prednisone therapy. She does have a history of osteoporosis and has been treated with Boniva and now Fosamax. She remains on calcium and vitamin D supplements. She is doing well today. She has been on biphosphonate for greater than 5 years.  Bone density studies have revealed osteopenia but not frank osteoporosis   Here for Medicare AWV:   1. Risk factors based on Past M, S, F history: no cardiovascular risk factors  2. Physical Activities: remains quite active physically, including a health club 3 times per week  3. Depression/mood: history depression, or mood disorder  4. Hearing: no hearing deficits  5. ADL's: independent in all aspects of daily living  6. Fall Risk: low  7. Home Safety: no problems identified  8. Height, weight, &visual acuity:height and weight stable. No difficulty with visual acuity  9 Counseling- heart healthy diet regular. Exercise regimen encouraged as well as calcium and vitamin D supplementation  10. Labs ordered based on risk factors: complete laboratory profile including lipid panel will be reviewed  11. Referral Coordination- will have mammogram performed in the spring  12. Care Plan- continue heart healthy diet regular. Exercise. Plan  13. Cognitive Assessment- alert and oriented, with normal affect. No memory disturbance, and [place], executive functioning without difficulty  14.  Preventive services will include annual  health assessments with screening lab.  Annual mammograms and I examinations will be encouraged.  Will follow serial colonoscopies and bone density studies.  Patient was provided with a written and personalized care plan 15.  Provider list updated.  Includes ophthalmology primary care.  GI and radiology Allergies:  1) ! Penicillin G Pot in Dextrose (Penicillin G Potassium in D5w)  2) ! Sulf-10   Past History:  Past Medical History:   Allergic rhinitis  GERD  Osteoporosis  Polymyalgia rheumatica   Past Surgical History:   Hysterectomy  Tubal ligation  colonoscopy 2007   Family History:   both parents died at age 48. Father from sepsis.  Mother of complications of heart failure  one brother one sister both have hypertension.  Sister on chronic dialysis, SLE, CAD, Asthma   Social History:    Married  Never Smoked  Regular exercise-yes    Past Medical History  Diagnosis Date  . Allergy   . GERD (gastroesophageal reflux disease)   . Osteoporosis     History   Social History  . Marital Status: Married    Spouse Name: N/A    Number of Children: N/A  . Years of Education: N/A   Occupational History  . Not on file.   Social History Main Topics  . Smoking status: Never Smoker   . Smokeless tobacco: Never Used  . Alcohol Use: Yes     Comment: rarely  . Drug Use: No  . Sexual Activity: Not on file   Other Topics Concern  . Not on file   Social History Narrative    Past Surgical History  Procedure Laterality Date  . Abdominal hysterectomy    . Tubal ligation    . Colonscopy  2007  . Cataract extraction  10/2011    Left    Family History  Problem Relation Age of Onset  . Heart failure Mother   . Hypertension Sister   . Asthma Sister   . Hypertension Brother     Allergies  Allergen Reactions  . Tetanus Toxoids     Arm swelling   . Penicillins   . Sulfacetamide Sodium     Current Outpatient Prescriptions on File Prior to Visit  Medication Sig  Dispense Refill  . alendronate (FOSAMAX) 70 MG tablet Take 1 tablet (70 mg total) by mouth every 7 (seven) days. Take with a full glass of water on an empty stomach. 12 tablet 3  . Calcium Carbonate-Vitamin D (CALCIUM-VITAMIN D) 600-200 MG-UNIT CAPS Take by mouth daily.      . fish oil-omega-3 fatty acids 1000 MG capsule Take 2 g by mouth daily.      . fluticasone (FLONASE) 50 MCG/ACT nasal spray Place 2 sprays into both nostrils daily. 16 g 6  . Melatonin 3 MG TABS Take 1 tablet by mouth at bedtime.    . Multiple Vitamins-Minerals (PRESERVISION AREDS 2 PO) Take 1 tablet by mouth 2 (two) times daily.    Marland Kitchen omeprazole (PRILOSEC) 20 MG capsule Take 1 capsule (20 mg total) by mouth daily. 90 capsule 3  . predniSONE (DELTASONE) 5 MG tablet Take 1 tablet (5 mg total) by mouth daily with breakfast. (Patient taking differently: Take 2.5 mg by mouth daily with breakfast. ) 90 tablet 3  . zolpidem (AMBIEN) 5 MG tablet Take 1 tablet (5 mg total) by mouth at bedtime as needed. 90 tablet 1   No current facility-administered medications on file prior to visit.    BP 110/72 mmHg  Pulse 80  Temp(Src) 98.3 F (36.8 C) (Oral)  Resp 18  Ht 5' 3.5" (1.613 m)  Wt 118 lb (53.524 kg)  BMI 20.57 kg/m2  SpO2 97%      Review of Systems  Constitutional: Negative for fever, appetite change, fatigue and unexpected weight change.  HENT: Negative for congestion, dental problem, ear pain, hearing loss, mouth sores, nosebleeds, sinus pressure, sore throat, tinnitus, trouble swallowing and voice change.   Eyes: Negative for photophobia, pain, redness and visual disturbance.  Respiratory: Negative for cough, chest tightness and shortness of breath.   Cardiovascular: Negative for chest pain, palpitations and leg swelling.  Gastrointestinal: Negative for nausea, vomiting, abdominal pain, diarrhea, constipation, blood in stool, abdominal distention and rectal pain.  Genitourinary: Negative for dysuria, urgency,  frequency, hematuria, flank pain, vaginal bleeding, vaginal discharge, difficulty urinating, genital sores, vaginal pain, menstrual problem and pelvic pain.  Musculoskeletal: Negative for back pain, arthralgias and neck stiffness.  Skin: Negative for rash.  Neurological: Negative for dizziness, syncope, speech difficulty, weakness, light-headedness, numbness and headaches.  Hematological: Negative for adenopathy. Does not bruise/bleed easily.  Psychiatric/Behavioral: Negative for suicidal ideas, behavioral problems, self-injury, dysphoric mood and agitation. The patient is not nervous/anxious.        Objective:   Physical Exam  Constitutional: She is oriented to person, place, and time. She appears well-developed and well-nourished.  HENT:  Head: Normocephalic and atraumatic.  Right Ear: External ear normal.  Left Ear: External ear normal.  Mouth/Throat: Oropharynx is clear and moist.  Eyes: Conjunctivae and EOM are normal.  Neck: Normal range of motion. Neck supple. No JVD present. No thyromegaly  present.  Cardiovascular: Normal rate, regular rhythm, normal heart sounds and intact distal pulses.   No murmur heard. Pulmonary/Chest: Effort normal and breath sounds normal. She has no wheezes. She has no rales.  Abdominal: Soft. Bowel sounds are normal. She exhibits no distension and no mass. There is no tenderness. There is no rebound and no guarding.  Musculoskeletal: Normal range of motion. She exhibits no edema or tenderness.  Neurological: She is alert and oriented to person, place, and time. She has normal reflexes. No cranial nerve deficit. She exhibits normal muscle tone. Coordination normal.  Skin: Skin is warm and dry. No rash noted.  Psychiatric: She has a normal mood and affect. Her behavior is normal.          Assessment & Plan   Preventative health examination Osteoporosis. We'll continue calcium and vitamin D supplements. We'll check a bone density in one year.  We'll  discontinue Fosamax after present supply Polymyalgia rheumatica. Check a sedimentation rate.  Helpful to further taper prednisone therapy Gastroesophageal reflux disease. Stable on present regimen  Recheck 6 months

## 2014-06-03 NOTE — Patient Instructions (Signed)

## 2014-06-03 NOTE — Progress Notes (Signed)
Pre visit review using our clinic review tool, if applicable. No additional management support is needed unless otherwise documented below in the visit note. 

## 2014-07-08 DIAGNOSIS — D239 Other benign neoplasm of skin, unspecified: Secondary | ICD-10-CM | POA: Diagnosis not present

## 2014-07-08 DIAGNOSIS — Z85828 Personal history of other malignant neoplasm of skin: Secondary | ICD-10-CM | POA: Diagnosis not present

## 2014-07-08 DIAGNOSIS — L821 Other seborrheic keratosis: Secondary | ICD-10-CM | POA: Diagnosis not present

## 2014-07-08 DIAGNOSIS — L700 Acne vulgaris: Secondary | ICD-10-CM | POA: Diagnosis not present

## 2014-07-08 DIAGNOSIS — L570 Actinic keratosis: Secondary | ICD-10-CM | POA: Diagnosis not present

## 2014-07-13 ENCOUNTER — Encounter: Payer: Self-pay | Admitting: Internal Medicine

## 2014-07-23 DIAGNOSIS — Z961 Presence of intraocular lens: Secondary | ICD-10-CM | POA: Diagnosis not present

## 2014-07-23 DIAGNOSIS — H40013 Open angle with borderline findings, low risk, bilateral: Secondary | ICD-10-CM | POA: Diagnosis not present

## 2014-07-23 DIAGNOSIS — H35342 Macular cyst, hole, or pseudohole, left eye: Secondary | ICD-10-CM | POA: Diagnosis not present

## 2014-07-23 DIAGNOSIS — H3531 Nonexudative age-related macular degeneration: Secondary | ICD-10-CM | POA: Diagnosis not present

## 2014-08-24 ENCOUNTER — Ambulatory Visit: Payer: Medicare Other | Admitting: Internal Medicine

## 2014-09-24 ENCOUNTER — Other Ambulatory Visit: Payer: Self-pay | Admitting: *Deleted

## 2014-09-24 ENCOUNTER — Encounter: Payer: Self-pay | Admitting: Internal Medicine

## 2014-09-24 ENCOUNTER — Ambulatory Visit (INDEPENDENT_AMBULATORY_CARE_PROVIDER_SITE_OTHER): Payer: Medicare Other | Admitting: Internal Medicine

## 2014-09-24 VITALS — BP 124/70 | HR 100 | Temp 99.7°F | Resp 20 | Ht 63.5 in | Wt 116.0 lb

## 2014-09-24 DIAGNOSIS — M353 Polymyalgia rheumatica: Secondary | ICD-10-CM | POA: Diagnosis not present

## 2014-09-24 DIAGNOSIS — R700 Elevated erythrocyte sedimentation rate: Secondary | ICD-10-CM

## 2014-09-24 DIAGNOSIS — J069 Acute upper respiratory infection, unspecified: Secondary | ICD-10-CM

## 2014-09-24 DIAGNOSIS — J3089 Other allergic rhinitis: Secondary | ICD-10-CM | POA: Diagnosis not present

## 2014-09-24 DIAGNOSIS — B9789 Other viral agents as the cause of diseases classified elsewhere: Principal | ICD-10-CM

## 2014-09-24 LAB — SEDIMENTATION RATE: SED RATE: 107 mm/h — AB (ref 0–22)

## 2014-09-24 MED ORDER — PREDNISONE 20 MG PO TABS: 40.0000 mg | ORAL_TABLET | Freq: Every day | ORAL | Status: DC

## 2014-09-24 MED ORDER — HYDROCODONE-HOMATROPINE 5-1.5 MG/5ML PO SYRP: 5.0000 mL | ORAL_SOLUTION | Freq: Four times a day (QID) | ORAL | Status: DC | PRN

## 2014-09-24 NOTE — Patient Instructions (Signed)
Call or return to clinic prn if these symptoms worsen or fail to improve as anticipated.  TREATMENT  Acute bronchitis usually goes away in a couple weeks. Oftentimes, no medical treatment is necessary. Medicines are sometimes given for relief of fever or cough. Antibiotic medicines are usually not needed but may be prescribed in certain situations. In some cases, an inhaler may be recommended to help reduce shortness of breath and control the cough. A cool mist vaporizer may also be used to help thin bronchial secretions and make it easier to clear the chest.  HOME CARE INSTRUCTIONS  Get plenty of rest.  Drink enough fluids to keep your urine clear or pale yellow (unless you have a medical condition that requires fluid restriction). Increasing fluids may help thin your respiratory secretions (sputum) and reduce chest congestion, and it will prevent dehydration.  Take medicines only as directed by your health care provider.    Avoid smoking and secondhand smoke. Exposure to cigarette smoke or irritating chemicals will make bronchitis worse. If you are a smoker, consider using nicotine gum or skin patches to help control withdrawal symptoms. Quitting smoking will help your lungs heal faster.  Reduce the chances of another bout of acute bronchitis by washing your hands frequently, avoiding people with cold symptoms, and trying not to touch your hands to your mouth, nose, or eyes.  Keep all follow-up visits as directed by your health care provider.

## 2014-09-24 NOTE — Progress Notes (Signed)
Subjective:    Patient ID: Savannah Jimenez, female    DOB: 1935/11/29, 79 y.o.   MRN: [id number]  HPI  79 year old patient who is on prednisone therapy for PMR.  4 days ago she developed a viral URI with head and chest congestion and cough.  She has had some low-grade fever.  She has been using Mucinex D as well as fluticasone nasal spray.  The symptoms have improved.  Also over the past few days she has had bitemporal pain.  She describes frequent paroxysms of sharp shooting pain in the bitemporal area.  These are very fleeting, but reoccur frequently.  She states that she has had some similar discomfort over the past couple years, mainly left-sided. She states that she had a father with the TA that was diagnosed late resulting in some permanent visual complications. She denies any visual disturbances constitutional symptoms, jaw claudication or more generalized type headaches  Past Medical History  Diagnosis Date  . Allergy   . GERD (gastroesophageal reflux disease)   . Osteoporosis     History   Social History  . Marital Status: Married    Spouse Name: N/A  . Number of Children: N/A  . Years of Education: N/A   Occupational History  . Not on file.   Social History Main Topics  . Smoking status: Never Smoker   . Smokeless tobacco: Never Used  . Alcohol Use: Yes     Comment: rarely  . Drug Use: No  . Sexual Activity: Not on file   Other Topics Concern  . Not on file   Social History Narrative    Past Surgical History  Procedure Laterality Date  . Abdominal hysterectomy    . Tubal ligation    . Colonscopy  2007  . Cataract extraction  10/2011    Left    Family History  Problem Relation Age of Onset  . Heart failure Mother   . Hypertension Sister   . Asthma Sister   . Hypertension Brother     Allergies  Allergen Reactions  . Tetanus Toxoids     Arm swelling   . Penicillins   . Sulfacetamide Sodium     Current Outpatient Prescriptions on File Prior to  Visit  Medication Sig Dispense Refill  . Calcium Carbonate-Vitamin D (CALCIUM-VITAMIN D) 600-200 MG-UNIT CAPS Take by mouth daily.      . fish oil-omega-3 fatty acids 1000 MG capsule Take 2 g by mouth daily.      . fluticasone (FLONASE) 50 MCG/ACT nasal spray Place 2 sprays into both nostrils daily. 16 g 6  . Melatonin 3 MG TABS Take 1 tablet by mouth at bedtime.    . Multiple Vitamins-Minerals (PRESERVISION AREDS 2 PO) Take 1 tablet by mouth 2 (two) times daily.    Marland Kitchen omeprazole (PRILOSEC) 20 MG capsule Take 1 capsule (20 mg total) by mouth daily. 90 capsule 3  . predniSONE (DELTASONE) 5 MG tablet Take 1 tablet (5 mg total) by mouth daily with breakfast. (Patient taking differently: Take 2.5 mg by mouth daily with breakfast. ) 90 tablet 3  . zolpidem (AMBIEN) 5 MG tablet Take 1 tablet (5 mg total) by mouth at bedtime as needed. 90 tablet 1   No current facility-administered medications on file prior to visit.    BP 124/70 mmHg  Pulse 100  Temp(Src) 99.7 F (37.6 C) (Oral)  Resp 20  Ht 5' 3.5" (1.613 m)  Wt 116 lb (52.617 kg)  BMI 20.22 kg/m2  SpO2 93%     Review of Systems  Constitutional: Negative.   HENT: Positive for congestion. Negative for dental problem, hearing loss, rhinorrhea, sinus pressure, sore throat and tinnitus.   Eyes: Negative for pain, discharge and visual disturbance.  Respiratory: Positive for cough. Negative for shortness of breath.   Cardiovascular: Negative for chest pain, palpitations and leg swelling.  Gastrointestinal: Negative for nausea, vomiting, abdominal pain, diarrhea, constipation, blood in stool and abdominal distention.  Genitourinary: Negative for dysuria, urgency, frequency, hematuria, flank pain, vaginal bleeding, vaginal discharge, difficulty urinating, vaginal pain and pelvic pain.  Musculoskeletal: Negative for joint swelling, arthralgias and gait problem.  Skin: Negative for rash.  Neurological: Positive for headaches. Negative for  dizziness, syncope, speech difficulty, weakness and numbness.  Hematological: Negative for adenopathy.  Psychiatric/Behavioral: Negative for behavioral problems, dysphoric mood and agitation. The patient is not nervous/anxious.        Objective:   Physical Exam  Constitutional: She is oriented to person, place, and time. She appears well-developed and well-nourished.  HENT:  Head: Normocephalic.  Right Ear: External ear normal.  Left Ear: External ear normal.  Mouth/Throat: Oropharynx is clear and moist.  No temporal artery tenderness  Eyes: Conjunctivae and EOM are normal. Pupils are equal, round, and reactive to light.  Neck: Normal range of motion. Neck supple. No thyromegaly present.  Cardiovascular: Normal rate, regular rhythm, normal heart sounds and intact distal pulses.   Pulmonary/Chest: Effort normal and breath sounds normal.  Abdominal: Soft. Bowel sounds are normal. She exhibits no mass. There is no tenderness.  Musculoskeletal: Normal range of motion.  Lymphadenopathy:    She has no cervical adenopathy.  Neurological: She is alert and oriented to person, place, and time.  Skin: Skin is warm and dry. No rash noted.  Psychiatric: She has a normal mood and affect. Her behavior is normal.          Assessment & Plan:   Bitemporal headaches.  Due to lack of constitutional complaints other symptoms, such as jaw claudication or visual disturbances and also prior history of similar headaches, do not feel these headaches are related to temporal arteritis.  The patient is on prednisone and a sedimentation rate will be checked today Resolving URI

## 2014-09-24 NOTE — Progress Notes (Signed)
Pre visit review using our clinic review tool, if applicable. No additional management support is needed unless otherwise documented below in the visit note. 

## 2014-09-25 ENCOUNTER — Telehealth: Payer: Self-pay | Admitting: Internal Medicine

## 2014-09-25 NOTE — Telephone Encounter (Signed)
Spoke to pt, told her Dr. Raliegh Ip said the appointment for Monday was fine. Pt verbalized understanding and stated why can't he just start me on an antibiotic? Told pt can not just go on antibiotic without a diagnosis and reason for it. Told her Dr.K put her on Prednisone to help with inflammation due to elevated Sed Rate. Pt verbalized understanding and will contact surgeon and confirm appt for Monday.

## 2014-09-25 NOTE — Telephone Encounter (Signed)
Pt scheduled for 09-28-2014@1 :70 with [facility] , informed pt of scheduled appt ,  She did not approve of this , stated it was  To long of a wait , felt that she should be seen sooner  Than march 7,2016 spoke with donna stated Dr Raliegh Ip was ok with the appt time and date , and she will inform the patient of this

## 2014-09-28 DIAGNOSIS — R51 Headache: Secondary | ICD-10-CM | POA: Diagnosis not present

## 2014-09-29 ENCOUNTER — Other Ambulatory Visit: Payer: Self-pay | Admitting: Internal Medicine

## 2014-09-29 DIAGNOSIS — M315 Giant cell arteritis with polymyalgia rheumatica: Secondary | ICD-10-CM

## 2014-09-30 ENCOUNTER — Encounter: Payer: Self-pay | Admitting: Internal Medicine

## 2014-10-01 ENCOUNTER — Ambulatory Visit (INDEPENDENT_AMBULATORY_CARE_PROVIDER_SITE_OTHER): Payer: Self-pay | Admitting: Surgery

## 2014-10-02 ENCOUNTER — Encounter (HOSPITAL_BASED_OUTPATIENT_CLINIC_OR_DEPARTMENT_OTHER): Payer: Self-pay | Admitting: *Deleted

## 2014-10-02 NOTE — Progress Notes (Signed)
No labs needed

## 2014-10-05 ENCOUNTER — Encounter (HOSPITAL_BASED_OUTPATIENT_CLINIC_OR_DEPARTMENT_OTHER): Payer: Self-pay | Admitting: Surgery

## 2014-10-05 DIAGNOSIS — R519 Headache, unspecified: Secondary | ICD-10-CM | POA: Clinically undetermined

## 2014-10-05 DIAGNOSIS — R51 Headache: Secondary | ICD-10-CM

## 2014-10-05 NOTE — H&P (Signed)
General Surgery [facility]  Savannah Jimenez 09/28/2014 1:24 PM DOB: 07/09/1936 Married / Language: Savannah Jimenez / Race: White Female  History of Present Illness Savannah Regal MD; 09/28/2014 2:02 PM) Patient words: temporal artery BX.  Patient is referred by Dr. Bluford Jimenez for right temporal artery biopsy. Patient had experienced sharp shooting pains in the right temporal area. She had had milder symptoms on the left. She denies any visual changes. Patient has polymyalgia rheumatica and is on low-dose prednisone therapy. Sedimentation rate was measured and found to be markedly elevated at 107. Prednisone dosage was increased to 40 mg daily 5 days ago. Patient presents today to discuss right temporal artery biopsy. She is accompanied by her daughter. Patient has a family history of temporal arteritis in her father who experienced some vision loss. Patient has not had any further headaches since increasing her prednisone dosage 5 days ago. After discussion, there is some reluctance to proceed with biopsy.   Other Problems Savannah Lorenzo, LPN; 01/27/7340 9:37 PM) Gastroesophageal Reflux Disease Melanoma Other disease, cancer, significant illness  Past Surgical History Savannah Lorenzo, LPN; 9/0/2409 7:35 PM) Cataract Surgery Bilateral. Hysterectomy (not due to cancer) - Partial  Diagnostic Studies History Savannah Lorenzo, LPN; 09/23/9922 2:68 PM) Colonoscopy 1-5 years ago Mammogram within last year  Allergies Savannah Lorenzo, LPN; 09/24/1960 2:29 PM) Penicillins redness to site Sulfa Antibiotics Tetanus Toxoids  Medication History Savannah Lorenzo, LPN; 01/30/8920 1:94 PM) Hydromet (5-1.5MG /5ML Syrup, Oral as needed) Active. Omeprazole (20MG  Capsule DR, Oral) Active. Fluticasone Propionate (50MCG/ACT Suspension, Nasal) Active. Prednisone (Oral) Active. (40mg  every morning) Aspirin Childrens (81MG  Tablet Chewable, Oral) Active. Fish Oil  Active. PreserVision AREDS 2 (Oral) Active. Vitamin D3 (1000UNIT Capsule, Oral) Active. Vitamin (Oral) Active. (womens) Melatonin ER (3MG  Tablet ER, Oral) Active. Ambien (5MG  Tablet, Oral) Active. Medications Reconciled  Social History Savannah Lorenzo, LPN; 07/31/4079 4:48 PM) No alcohol use No caffeine use No drug use Tobacco use Never smoker.  Family History Savannah Lorenzo, LPN; 07/31/5629 4:97 PM) Heart Disease Mother. Heart disease in female family member before age 34 Hypertension Brother, Father, Sister. Kidney Disease Sister. Respiratory Condition Sister.  Pregnancy / Birth History Savannah Lorenzo, LPN; 0/08/6376 5:88 PM) Age at menarche 76 years. Age of menopause <47 Gravida 2 Maternal age 59-25 Para 2  Review of Systems Savannah Lorenzo LPN; 5/0/2774 1:28 PM) General Present- Appetite Loss. Not Present- Chills, Fatigue, Fever, Night Sweats, Weight Gain and Weight Loss. Skin Present- Dryness. Not Present- Change in Wart/Mole, Hives, Jaundice, New Lesions, Non-Healing Wounds, Rash and Ulcer. HEENT Present- Hearing Loss, Ringing in the Ears, Seasonal Allergies and Wears glasses/contact lenses. Not Present- Earache, Hoarseness, Nose Bleed, Oral Ulcers, Sinus Pain, Sore Throat, Visual Disturbances and Yellow Eyes. Respiratory Not Present- Bloody sputum, Chronic Cough, Difficulty Breathing, Snoring and Wheezing. Breast Not Present- Breast Mass, Breast Pain, Nipple Discharge and Skin Changes. Cardiovascular Not Present- Chest Pain, Difficulty Breathing Lying Down, Leg Cramps, Palpitations, Rapid Heart Rate, Shortness of Breath and Swelling of Extremities. Gastrointestinal Present- Difficulty Swallowing. Not Present- Abdominal Pain, Bloating, Bloody Stool, Change in Bowel Habits, Chronic diarrhea, Constipation, Excessive gas, Gets full quickly at meals, Hemorrhoids, Indigestion, Nausea, Rectal Pain and Vomiting. Female Genitourinary Not Present- Frequency, Nocturia,  Painful Urination, Pelvic Pain and Urgency. Musculoskeletal Present- Joint Stiffness. Not Present- Back Pain, Joint Pain, Muscle Pain, Muscle Weakness and Swelling of Extremities. Neurological Not Present- Decreased Memory, Fainting, Headaches, Numbness, Seizures, Tingling, Tremor, Trouble walking and Weakness. Psychiatric Not Present- Anxiety, Bipolar, Change in Sleep  Pattern, Depression, Fearful and Frequent crying. Endocrine Not Present- Cold Intolerance, Excessive Hunger, Hair Changes, Heat Intolerance, Hot flashes and New Diabetes. Hematology Not Present- Easy Bruising, Excessive bleeding, Gland problems, HIV and Persistent Infections.   Vitals Savannah Billings Dockery LPN; 03/28/7095 2:83 PM) 09/28/2014 1:25 PM Weight: 108.13 lb Height: 63in Body Surface Area: 1.48 m Body Mass Index: 19.15 kg/m Temp.: 97.92F  Pulse: 83 (Regular)  BP: 118/84 (Sitting, Left Arm, Standard)    Physical Exam Savannah Regal MD; 09/28/2014 2:03 PM)  General - appears comfortable, no distress; not diaphorectic  HEENT - normocephalic; sclerae clear, gaze conjugate; mucous membranes moist, dentition good; voice normal; extraocular movements intact; no tenderness in the right or left temporal region; palpable temporal artery pulse bilaterally  Neck - symmetric on extension; no palpable anterior or posterior cervical adenopathy; no palpable masses in the thyroid bed  Chest - clear bilaterally with rhonchi, rales, or wheeze  Cor - regular rhythm with normal rate; no significant murmur  Ext - non-tender without significant edema or lymphedema  Neuro - grossly intact; mild tremor    Assessment & Plan Savannah Regal MD; 09/28/2014 2:06 PM)  RIGHT TEMPORAL HEADACHE (784.0  R51)  Patient is being evaluated by her primary care physician for right temporal headaches. There is concern over possible underlying temporal arteritis. She is currently on prednisone 40 mg daily.  If they desire to proceed, we will  make arrangements as soon as possible for temporal artery biopsy as an outpatient surgical procedure. We discussed the procedure at length today. We discussed anesthesia, the location of the incision, and her postoperative recovery. They understand.  The risks and benefits of the procedure have been discussed at length with the patient.  The patient understands the proposed procedure, potential alternative treatments, and the course of recovery to be expected.  All of the patient's questions have been answered at this time.  The patient wishes to proceed with surgery.  Savannah Regal, MD, [facility] Office: [phone]

## 2014-10-06 ENCOUNTER — Ambulatory Visit (HOSPITAL_BASED_OUTPATIENT_CLINIC_OR_DEPARTMENT_OTHER): Payer: Medicare Other | Admitting: Anesthesiology

## 2014-10-06 ENCOUNTER — Encounter (HOSPITAL_BASED_OUTPATIENT_CLINIC_OR_DEPARTMENT_OTHER)
Admission: RE | Disposition: A | Discharge status: Home / Self Care | Payer: Self-pay | Source: Ambulatory Visit | Attending: Surgery

## 2014-10-06 ENCOUNTER — Ambulatory Visit (HOSPITAL_BASED_OUTPATIENT_CLINIC_OR_DEPARTMENT_OTHER)
Admission: RE | Admit: 2014-10-06 | Discharge: 2014-10-06 | Disposition: A | Discharge status: Home / Self Care | Payer: Medicare Other | Source: Ambulatory Visit | Attending: Surgery | Admitting: Surgery

## 2014-10-06 ENCOUNTER — Encounter: Payer: Self-pay | Admitting: Internal Medicine

## 2014-10-06 ENCOUNTER — Encounter (HOSPITAL_BASED_OUTPATIENT_CLINIC_OR_DEPARTMENT_OTHER): Payer: Self-pay | Admitting: Anesthesiology

## 2014-10-06 DIAGNOSIS — I672 Cerebral atherosclerosis: Secondary | ICD-10-CM | POA: Diagnosis not present

## 2014-10-06 DIAGNOSIS — R51 Headache: Secondary | ICD-10-CM | POA: Diagnosis not present

## 2014-10-06 DIAGNOSIS — M353 Polymyalgia rheumatica: Secondary | ICD-10-CM | POA: Insufficient documentation

## 2014-10-06 DIAGNOSIS — Z882 Allergy status to sulfonamides status: Secondary | ICD-10-CM | POA: Diagnosis not present

## 2014-10-06 DIAGNOSIS — K219 Gastro-esophageal reflux disease without esophagitis: Secondary | ICD-10-CM | POA: Insufficient documentation

## 2014-10-06 DIAGNOSIS — Z880 Allergy status to penicillin: Secondary | ICD-10-CM | POA: Insufficient documentation

## 2014-10-06 DIAGNOSIS — Z85820 Personal history of malignant melanoma of skin: Secondary | ICD-10-CM | POA: Diagnosis not present

## 2014-10-06 DIAGNOSIS — R519 Headache, unspecified: Secondary | ICD-10-CM | POA: Clinically undetermined

## 2014-10-06 DIAGNOSIS — Z90710 Acquired absence of both cervix and uterus: Secondary | ICD-10-CM | POA: Insufficient documentation

## 2014-10-06 HISTORY — PX: ARTERY BIOPSY: SHX891

## 2014-10-06 HISTORY — DX: Unspecified osteoarthritis, unspecified site: M19.90

## 2014-10-06 HISTORY — DX: Unspecified hearing loss, unspecified ear: H91.90

## 2014-10-06 LAB — POCT HEMOGLOBIN-HEMACUE: HEMOGLOBIN: 11.7 g/dL — AB (ref 12.0–15.0)

## 2014-10-06 SURGERY — BIOPSY TEMPORAL ARTERY
Anesthesia: General | Site: Face | Laterality: Right

## 2014-10-06 MED ORDER — PROPOFOL 10 MG/ML IV BOLUS
INTRAVENOUS | Status: AC
  Filled 2014-10-06: qty 60

## 2014-10-06 MED ORDER — HYDROMORPHONE HCL 1 MG/ML IJ SOLN
0.2500 mg | INTRAMUSCULAR | Status: DC | PRN
Start: 2014-10-06 — End: 2014-10-06

## 2014-10-06 MED ORDER — ONDANSETRON HCL 4 MG/2ML IJ SOLN
INTRAMUSCULAR | Status: DC | PRN
  Administered 2014-10-06: 4 mg via INTRAVENOUS

## 2014-10-06 MED ORDER — CIPROFLOXACIN IN D5W 400 MG/200ML IV SOLN
INTRAVENOUS | Status: DC | PRN
  Administered 2014-10-06: 400 mg via INTRAVENOUS

## 2014-10-06 MED ORDER — LIDOCAINE HCL (PF) 1 % IJ SOLN
INTRAMUSCULAR | Status: AC
  Filled 2014-10-06: qty 30

## 2014-10-06 MED ORDER — CIPROFLOXACIN IN D5W 400 MG/200ML IV SOLN
INTRAVENOUS | Status: AC
  Filled 2014-10-06: qty 200

## 2014-10-06 MED ORDER — CIPROFLOXACIN IN D5W 400 MG/200ML IV SOLN: 400.0000 mg | INTRAVENOUS | Status: DC

## 2014-10-06 MED ORDER — FENTANYL CITRATE 0.05 MG/ML IJ SOLN
INTRAMUSCULAR | Status: AC
  Filled 2014-10-06: qty 6

## 2014-10-06 MED ORDER — MIDAZOLAM HCL 2 MG/2ML IJ SOLN: 1.0000 mg | INTRAMUSCULAR | Status: DC | PRN

## 2014-10-06 MED ORDER — PROPOFOL 10 MG/ML IV EMUL
INTRAVENOUS | Status: AC
  Filled 2014-10-06: qty 50

## 2014-10-06 MED ORDER — LIDOCAINE-EPINEPHRINE (PF) 1 %-1:200000 IJ SOLN
INTRAMUSCULAR | Status: AC
  Filled 2014-10-06: qty 10

## 2014-10-06 MED ORDER — FENTANYL CITRATE 0.05 MG/ML IJ SOLN
INTRAMUSCULAR | Status: DC | PRN
Start: 2014-10-06 — End: 2014-10-06
  Administered 2014-10-06: 25 ug via INTRAVENOUS
  Administered 2014-10-06: 50 ug via INTRAVENOUS
  Administered 2014-10-06: 25 ug via INTRAVENOUS

## 2014-10-06 MED ORDER — PROPOFOL 10 MG/ML IV BOLUS
INTRAVENOUS | Status: DC | PRN
Start: 2014-10-06 — End: 2014-10-06
  Administered 2014-10-06: 150 mg via INTRAVENOUS

## 2014-10-06 MED ORDER — BUPIVACAINE HCL (PF) 0.5 % IJ SOLN
INTRAMUSCULAR | Status: AC
  Filled 2014-10-06: qty 30

## 2014-10-06 MED ORDER — LIDOCAINE HCL (CARDIAC) 20 MG/ML IV SOLN
INTRAVENOUS | Status: DC | PRN
  Administered 2014-10-06: 30 mg via INTRAVENOUS

## 2014-10-06 MED ORDER — MEPERIDINE HCL 25 MG/ML IJ SOLN: 6.2500 mg | INTRAMUSCULAR | Status: DC | PRN

## 2014-10-06 MED ORDER — FENTANYL CITRATE 0.05 MG/ML IJ SOLN: 50.0000 ug | INTRAMUSCULAR | Status: DC | PRN

## 2014-10-06 MED ORDER — PROMETHAZINE HCL 25 MG/ML IJ SOLN: 6.2500 mg | INTRAMUSCULAR | Status: DC | PRN

## 2014-10-06 MED ORDER — MIDAZOLAM HCL 2 MG/2ML IJ SOLN
INTRAMUSCULAR | Status: AC
  Filled 2014-10-06: qty 2

## 2014-10-06 MED ORDER — OXYCODONE HCL 5 MG PO TABS: 5.0000 mg | ORAL_TABLET | Freq: Four times a day (QID) | ORAL | Status: DC | PRN

## 2014-10-06 MED ORDER — BUPIVACAINE HCL (PF) 0.5 % IJ SOLN
INTRAMUSCULAR | Status: DC | PRN
  Administered 2014-10-06: 5 mL

## 2014-10-06 MED ORDER — BUPIVACAINE-EPINEPHRINE (PF) 0.5% -1:200000 IJ SOLN
INTRAMUSCULAR | Status: AC
  Filled 2014-10-06: qty 30

## 2014-10-06 MED ORDER — DEXAMETHASONE SODIUM PHOSPHATE 4 MG/ML IJ SOLN
INTRAMUSCULAR | Status: DC | PRN
  Administered 2014-10-06: 10 mg via INTRAVENOUS

## 2014-10-06 MED ORDER — LACTATED RINGERS IV SOLN
INTRAVENOUS | Status: DC
  Administered 2014-10-06 (×2): via INTRAVENOUS

## 2014-10-06 SURGICAL SUPPLY — 40 items
APL SKNCLS STERI-STRIP NONHPOA (GAUZE/BANDAGES/DRESSINGS)
BENZOIN TINCTURE PRP APPL 2/3 (GAUZE/BANDAGES/DRESSINGS) IMPLANT
BLADE SURG 15 STRL LF DISP TIS (BLADE) ×1 IMPLANT
BLADE SURG 15 STRL SS (BLADE) ×2
CLEANER CAUTERY TIP 5X5 PAD (MISCELLANEOUS) IMPLANT
CLIP TI WIDE RED SMALL 6 (CLIP) IMPLANT
COVER BACK TABLE 60X90IN (DRAPES) ×2 IMPLANT
COVER MAYO STAND STRL (DRAPES) ×2 IMPLANT
DECANTER SPIKE VIAL GLASS SM (MISCELLANEOUS) IMPLANT
DERMABOND ADVANCED (GAUZE/BANDAGES/DRESSINGS) ×1
DERMABOND ADVANCED .7 DNX12 (GAUZE/BANDAGES/DRESSINGS) ×1 IMPLANT
DRAPE U-SHAPE 76X120 STRL (DRAPES) ×2 IMPLANT
DRAPE UTILITY XL STRL (DRAPES) ×2 IMPLANT
ELECT REM PT RETURN 9FT ADLT (ELECTROSURGICAL) ×2
ELECTRODE REM PT RTRN 9FT ADLT (ELECTROSURGICAL) ×1 IMPLANT
GLOVE BIOGEL PI IND STRL 8 (GLOVE) ×1 IMPLANT
GLOVE BIOGEL PI INDICATOR 8 (GLOVE) ×1
GLOVE SURG ORTHO 8.0 STRL STRW (GLOVE) ×2 IMPLANT
GLOVE SURG SS PI 8.0 STRL IVOR (GLOVE) ×2 IMPLANT
GOWN STRL REUS W/ TWL LRG LVL3 (GOWN DISPOSABLE) IMPLANT
GOWN STRL REUS W/ TWL XL LVL3 (GOWN DISPOSABLE) ×2 IMPLANT
GOWN STRL REUS W/TWL LRG LVL3 (GOWN DISPOSABLE)
GOWN STRL REUS W/TWL XL LVL3 (GOWN DISPOSABLE) ×4
NEEDLE HYPO 25X1 1.5 SAFETY (NEEDLE) ×2 IMPLANT
NS IRRIG 1000ML POUR BTL (IV SOLUTION) IMPLANT
PACK BASIN DAY SURGERY FS (CUSTOM PROCEDURE TRAY) ×2 IMPLANT
PAD CLEANER CAUTERY TIP 5X5 (MISCELLANEOUS)
PENCIL BUTTON HOLSTER BLD 10FT (ELECTRODE) ×2 IMPLANT
SLEEVE SCD COMPRESS KNEE MED (MISCELLANEOUS) ×2 IMPLANT
SPONGE GAUZE 4X4 12PLY STER LF (GAUZE/BANDAGES/DRESSINGS) ×2 IMPLANT
STRIP CLOSURE SKIN 1/4X4 (GAUZE/BANDAGES/DRESSINGS) IMPLANT
SUT ETHILON 3 0 PS 1 (SUTURE) IMPLANT
SUT SILK 3 0 SH 30 (SUTURE) ×2 IMPLANT
SUT VICRYL 3-0 CR8 SH (SUTURE) IMPLANT
SUT VICRYL 4-0 PS2 18IN ABS (SUTURE) IMPLANT
SWABSTICK POVIDONE IODINE SNGL (MISCELLANEOUS) ×4 IMPLANT
SYR CONTROL 10ML LL (SYRINGE) ×2 IMPLANT
TOWEL OR 17X24 6PK STRL BLUE (TOWEL DISPOSABLE) ×2 IMPLANT
TOWEL OR NON WOVEN STRL DISP B (DISPOSABLE) ×2 IMPLANT
TRAY DSU PREP LF (CUSTOM PROCEDURE TRAY) IMPLANT

## 2014-10-06 NOTE — Anesthesia Procedure Notes (Signed)
Procedure Name: LMA Insertion Date/Time: 10/06/2014 11:38 AM Performed by: Toula Moos L Pre-anesthesia Checklist: Patient identified, Emergency Drugs available, Suction available, Patient being monitored and Timeout performed Patient Re-evaluated:Patient Re-evaluated prior to inductionOxygen Delivery Method: Circle System Utilized Preoxygenation: Pre-oxygenation with 100% oxygen Intubation Type: IV induction Ventilation: Mask ventilation without difficulty LMA: LMA inserted LMA Size: 4.0 Number of attempts: 1 Airway Equipment and Method: Bite block Placement Confirmation: positive ETCO2 Tube secured with: Tape Dental Injury: Teeth and Oropharynx as per pre-operative assessment

## 2014-10-06 NOTE — Anesthesia Preprocedure Evaluation (Signed)
Anesthesia Evaluation  Patient identified by MRN, date of birth, ID band Patient awake    Reviewed: Allergy & Precautions, NPO status , Patient's Chart, lab work & pertinent test results  Airway Mallampati: II  TM Distance: >3 FB Neck ROM: Full    Dental no notable dental hx.    Pulmonary neg pulmonary ROS,  breath sounds clear to auscultation  Pulmonary exam normal       Cardiovascular negative cardio ROS  Rhythm:Regular Rate:Normal     Neuro/Psych  Headaches, negative psych ROS   GI/Hepatic Neg liver ROS, GERD-  ,  Endo/Other  negative endocrine ROS  Renal/GU negative Renal ROS     Musculoskeletal  (+) Arthritis -,   Abdominal   Peds  Hematology negative hematology ROS (+)   Anesthesia Other Findings   Reproductive/Obstetrics negative OB ROS                             Anesthesia Physical Anesthesia Plan  ASA: II  Anesthesia Plan: General   Post-op Pain Management:    Induction: Intravenous  Airway Management Planned: LMA  Additional Equipment:   Intra-op Plan:   Post-operative Plan: Extubation in OR  Informed Consent: I have reviewed the patients History and Physical, chart, labs and discussed the procedure including the risks, benefits and alternatives for the proposed anesthesia with the patient or authorized representative who has indicated his/her understanding and acceptance.   Dental advisory given  Plan Discussed with: CRNA  Anesthesia Plan Comments:         Anesthesia Quick Evaluation

## 2014-10-06 NOTE — Transfer of Care (Signed)
Immediate Anesthesia Transfer of Care Note  Patient: Savannah Jimenez  Procedure(s) Performed: Procedure(s): RIGHT TEMPORAL ARTERY BIOPSY (Right)  Patient Location: PACU  Anesthesia Type:General  Level of Consciousness: awake and patient cooperative  Airway & Oxygen Therapy: Patient Spontanous Breathing and Patient connected to face mask oxygen  Post-op Assessment: Report given to RN and Post -op Vital signs reviewed and stable  Post vital signs: Reviewed and stable  Last Vitals:  Filed Vitals:   10/06/14 1001  BP: 153/60  Pulse: 93  Temp: 36.7 C  Resp: 18    Complications: No apparent anesthesia complications

## 2014-10-06 NOTE — Interval H&P Note (Signed)
History and Physical Interval Note:  10/06/2014 11:21 AM  Savannah Jimenez  has presented today for surgery, with the diagnosis of rule out temporal arterties, headache.  The various methods of treatment have been discussed with the patient and family. After consideration of risks, benefits and other options for treatment, the patient has consented to    Procedure(s): RIGHT TEMPORAL ARTERY BIOPSY (Right) as a surgical intervention .    The patient's history has been reviewed, patient examined, no change in status, stable for surgery.  I have reviewed the patient's chart and labs.  Questions were answered to the patient's satisfaction.    [name], MD, [facility] Office: [place]

## 2014-10-06 NOTE — Anesthesia Postprocedure Evaluation (Signed)
Anesthesia Post Note  Patient: Savannah Jimenez  Procedure(s) Performed: Procedure(s) (LRB): RIGHT TEMPORAL ARTERY BIOPSY (Right)  Anesthesia type: General  Patient location: PACU  Post pain: Pain level controlled and Adequate analgesia  Post assessment: Post-op Vital signs reviewed, Patient's Cardiovascular Status Stable, Respiratory Function Stable, Patent Airway and Pain level controlled  Last Vitals:  Filed Vitals:   10/06/14 1400  BP: 119/49  Pulse: 70  Temp: 36.9 C  Resp: 16    Post vital signs: Reviewed and stable  Level of consciousness: awake, alert  and oriented  Complications: No apparent anesthesia complications

## 2014-10-06 NOTE — Discharge Instructions (Signed)

## 2014-10-06 NOTE — Brief Op Note (Signed)
10/06/2014  12:36 PM  PATIENT:  [name]  79 y.o. female  PRE-OPERATIVE DIAGNOSIS:  rule out temporal arterties, headache  POST-OPERATIVE DIAGNOSIS:  rule out temporal arterties, headache  PROCEDURE:  Procedure(s): RIGHT TEMPORAL ARTERY BIOPSY (Right)  SURGEON:  Surgeon(s) and Role:    * Armandina Gemma, MD - Primary  ANESTHESIA:   general  EBL:  Total I/O In: 1100 [I.V.:1100] Out: -   BLOOD ADMINISTERED:none  DRAINS: none   LOCAL MEDICATIONS USED:  MARCAINE     SPECIMEN:  Excision  DISPOSITION OF SPECIMEN:  PATHOLOGY  COUNTS:  YES  TOURNIQUET:  * No tourniquets in log *  DICTATION: .Other Dictation: Dictation Number [id number]  PLAN OF CARE: Discharge to home after PACU  PATIENT DISPOSITION:  PACU - hemodynamically stable.   Delay start of Pharmacological VTE agent (>24hrs) due to surgical blood loss or risk of bleeding: yes  [name], MD, [facility] Office: [phone]

## 2014-10-07 NOTE — Op Note (Signed)
NAME:  Savannah Jimenez, HOOPINGARNER NO.:  [id number]  MEDICAL RECORD NO.:  [id number]  LOCATION:                                 FACILITY:  PHYSICIAN:  [name], MD      DATE OF BIRTH:  12-05-1935  DATE OF PROCEDURE:  10/06/2014                              OPERATIVE REPORT   PREOPERATIVE DIAGNOSIS:  Rule out temporal arteritis, right temporal headache.  POSTOPERATIVE DIAGNOSIS:  Rule out temporal arteritis, right temporal headache.  PROCEDURE:  Right temporal artery biopsy.  SURGEON:  [name], MD, FACS  ANESTHESIA:  General.  ESTIMATED BLOOD LOSS:  Minimal.  PREPARATION:  Betadine.  COMPLICATIONS:  None.  INDICATIONS:  The patient is a 79 year old female, who presented with right temporal headaches on referral from her primary care physician. After consultation with the rheumatologist, a right temporal artery biopsy was requested.  The patient is now prepared and brought to the operating room.  BODY OF REPORT:  Procedure was done in OR #6 at the [facility].  The patient was brought to the operating room, placed in supine position on the operating room table.  Following administration of general anesthesia, the patient was positioned and prepped and draped in the usual aseptic fashion.  After ascertaining that an adequate level of anesthesia had been achieved, the right temporal area was scanned with the Doppler.  An arterial pulse was identified in the preauricular location.  Using a #15 blade, a 2 cm incision was made.  Dissection was carried through the skin into the subcutaneous tissues.  The artery was identified.  It was dissected out along approximately a 2 cm segment. It was ligated proximally and distally with 3-0 silk ligatures and a 1.5 cm segment of artery was excised and submitted in saline to Pathology for review.  Good hemostasis was achieved with the electrocautery.  Local anesthesia was infiltrated around the  incision.  The incision was closed with interrupted 4-0 Monocryl subcuticular sutures.  Wound was washed and dried and Dermabond was applied as dressing.  The patient was awakened from anesthesia and taken to the recovery room.  The patient tolerated the procedure well.   [name], MD, [facility] Office: [phone]    TMG/MEDQ  D:  10/06/2014  T:  10/07/2014  Job:  [id number]  cc:   Marletta Lor, MD Lindaann Slough, M.D.

## 2014-10-09 ENCOUNTER — Encounter (HOSPITAL_BASED_OUTPATIENT_CLINIC_OR_DEPARTMENT_OTHER): Payer: Self-pay | Admitting: Surgery

## 2014-10-13 DIAGNOSIS — M8589 Other specified disorders of bone density and structure, multiple sites: Secondary | ICD-10-CM | POA: Diagnosis not present

## 2014-10-13 DIAGNOSIS — R51 Headache: Secondary | ICD-10-CM | POA: Diagnosis not present

## 2014-10-13 DIAGNOSIS — Z79899 Other long term (current) drug therapy: Secondary | ICD-10-CM | POA: Diagnosis not present

## 2014-10-13 DIAGNOSIS — R700 Elevated erythrocyte sedimentation rate: Secondary | ICD-10-CM | POA: Diagnosis not present

## 2014-10-13 DIAGNOSIS — K069 Disorder of gingiva and edentulous alveolar ridge, unspecified: Secondary | ICD-10-CM | POA: Diagnosis not present

## 2014-10-13 DIAGNOSIS — M545 Low back pain: Secondary | ICD-10-CM | POA: Diagnosis not present

## 2014-10-13 DIAGNOSIS — I776 Arteritis, unspecified: Secondary | ICD-10-CM | POA: Diagnosis not present

## 2014-10-19 ENCOUNTER — Ambulatory Visit (HOSPITAL_BASED_OUTPATIENT_CLINIC_OR_DEPARTMENT_OTHER): Payer: Medicare Other

## 2014-10-19 ENCOUNTER — Ambulatory Visit
Admission: RE | Admit: 2014-10-19 | Discharge: 2014-10-19 | Disposition: A | Discharge status: Home / Self Care | Payer: Medicare Other | Source: Ambulatory Visit | Attending: Internal Medicine | Admitting: Internal Medicine

## 2014-10-19 ENCOUNTER — Other Ambulatory Visit: Payer: Self-pay | Admitting: Internal Medicine

## 2014-10-19 DIAGNOSIS — Z1231 Encounter for screening mammogram for malignant neoplasm of breast: Secondary | ICD-10-CM

## 2014-11-04 ENCOUNTER — Ambulatory Visit (INDEPENDENT_AMBULATORY_CARE_PROVIDER_SITE_OTHER): Payer: Medicare Other | Admitting: Ophthalmology

## 2014-11-04 DIAGNOSIS — H43813 Vitreous degeneration, bilateral: Secondary | ICD-10-CM

## 2014-11-04 DIAGNOSIS — H3531 Nonexudative age-related macular degeneration: Secondary | ICD-10-CM | POA: Diagnosis not present

## 2014-11-04 DIAGNOSIS — H35342 Macular cyst, hole, or pseudohole, left eye: Secondary | ICD-10-CM

## 2014-11-12 DIAGNOSIS — M353 Polymyalgia rheumatica: Secondary | ICD-10-CM | POA: Diagnosis not present

## 2014-11-12 DIAGNOSIS — I776 Arteritis, unspecified: Secondary | ICD-10-CM | POA: Diagnosis not present

## 2014-11-12 DIAGNOSIS — Z79899 Other long term (current) drug therapy: Secondary | ICD-10-CM | POA: Diagnosis not present

## 2014-11-16 ENCOUNTER — Encounter: Payer: Self-pay | Admitting: Internal Medicine

## 2014-11-16 MED ORDER — FLUTICASONE PROPIONATE 50 MCG/ACT NA SUSP: 2.0000 | Freq: Every day | NASAL | Status: DC

## 2014-12-01 ENCOUNTER — Ambulatory Visit: Payer: Medicare Other | Admitting: Internal Medicine

## 2014-12-24 DIAGNOSIS — M545 Low back pain: Secondary | ICD-10-CM | POA: Diagnosis not present

## 2014-12-24 DIAGNOSIS — M315 Giant cell arteritis with polymyalgia rheumatica: Secondary | ICD-10-CM | POA: Diagnosis not present

## 2014-12-24 DIAGNOSIS — H539 Unspecified visual disturbance: Secondary | ICD-10-CM | POA: Diagnosis not present

## 2014-12-24 DIAGNOSIS — R700 Elevated erythrocyte sedimentation rate: Secondary | ICD-10-CM | POA: Diagnosis not present

## 2014-12-25 ENCOUNTER — Ambulatory Visit (INDEPENDENT_AMBULATORY_CARE_PROVIDER_SITE_OTHER): Payer: Medicare Other | Admitting: Internal Medicine

## 2014-12-25 ENCOUNTER — Encounter: Payer: Self-pay | Admitting: Internal Medicine

## 2014-12-25 VITALS — BP 110/70 | HR 86 | Temp 98.6°F | Resp 18 | Ht 63.0 in | Wt 118.0 lb

## 2014-12-25 DIAGNOSIS — L259 Unspecified contact dermatitis, unspecified cause: Secondary | ICD-10-CM | POA: Diagnosis not present

## 2014-12-25 DIAGNOSIS — M353 Polymyalgia rheumatica: Secondary | ICD-10-CM

## 2014-12-25 LAB — SEDIMENTATION RATE: SED RATE: 41 mm/h — AB (ref 0–22)

## 2014-12-25 MED ORDER — ZOLPIDEM TARTRATE 5 MG PO TABS: 5.0000 mg | ORAL_TABLET | Freq: Every evening | ORAL | Status: DC | PRN

## 2014-12-25 NOTE — Patient Instructions (Signed)
Report any new or worsening symptoms  Taper and discontinue prednisone as directed  Return in 6 months for follow-up

## 2014-12-25 NOTE — Progress Notes (Signed)
Subjective:    Patient ID: Savannah Jimenez, female    DOB: 1936/03/08, 79 y.o.   MRN: [id number]  HPI  79 year old patient who has a history of PMR.  She has seen rheumatology and is in the process of a prednisone taper.  She presently is on prednisone 2.5 milligrams every other day with plans to discontinue in 2 weeks. Complaints today include a rash and itching involving the right upper mid back area at the site of her bra line  She has a history of allergic rhinitis.  She also complains of some persistent cough following a URI about 3 months ago.  Past Medical History  Diagnosis Date  . Allergy   . GERD (gastroesophageal reflux disease)   . Osteoporosis   . Arthritis   . HOH (hard of hearing)     History   Social History  . Marital Status: Married    Spouse Name: N/A  . Number of Children: N/A  . Years of Education: N/A   Occupational History  . Not on file.   Social History Main Topics  . Smoking status: Never Smoker   . Smokeless tobacco: Never Used  . Alcohol Use: Yes     Comment: rarely  . Drug Use: No  . Sexual Activity: Not on file   Other Topics Concern  . Not on file   Social History Narrative    Past Surgical History  Procedure Laterality Date  . Abdominal hysterectomy    . Tubal ligation    . Colonscopy  2007  . Cataract extraction  10/2011    Left  . Inner ear surgery  1974    both stapes replaced-steal  . Artery biopsy Right 10/06/2014    Procedure: BIOPSY TEMPORAL ARTERY;  Surgeon: Armandina Gemma, MD;  Location: [place];  Service: General;  Laterality: Right;    Family History  Problem Relation Age of Onset  . Heart failure Mother   . Hypertension Sister   . Asthma Sister   . Hypertension Brother     Allergies  Allergen Reactions  . Tetanus Toxoids     Arm swelling   . Penicillins   . Sulfacetamide Sodium     Current Outpatient Prescriptions on File Prior to Visit  Medication Sig Dispense Refill  . Calcium  Carbonate-Vitamin D (CALCIUM-VITAMIN D) 600-200 MG-UNIT CAPS Take by mouth daily.      . fish oil-omega-3 fatty acids 1000 MG capsule Take 2 g by mouth daily.      . fluticasone (FLONASE) 50 MCG/ACT nasal spray Place 2 sprays into both nostrils daily. 16 g 6  . Melatonin 3 MG TABS Take 1 tablet by mouth at bedtime.    . Multiple Vitamins-Minerals (PRESERVISION AREDS 2 PO) Take 1 tablet by mouth 2 (two) times daily.    Marland Kitchen omeprazole (PRILOSEC) 20 MG capsule Take 1 capsule (20 mg total) by mouth daily. 90 capsule 3   No current facility-administered medications on file prior to visit.    BP 110/70 mmHg  Pulse 86  Temp(Src) 98.6 F (37 C) (Oral)  Resp 18  Ht 5\' 3"  (1.6 m)  Wt 118 lb (53.524 kg)  BMI 20.91 kg/m2  SpO2 98%     Review of Systems  Constitutional: Negative.   HENT: Negative for congestion, dental problem, hearing loss, rhinorrhea, sinus pressure, sore throat and tinnitus.   Eyes: Negative for pain, discharge and visual disturbance.  Respiratory: Positive for cough. Negative for shortness of breath.  Cardiovascular: Negative for chest pain, palpitations and leg swelling.  Gastrointestinal: Negative for nausea, vomiting, abdominal pain, diarrhea, constipation, blood in stool and abdominal distention.  Genitourinary: Negative for dysuria, urgency, frequency, hematuria, flank pain, vaginal bleeding, vaginal discharge, difficulty urinating, vaginal pain and pelvic pain.  Musculoskeletal: Negative for joint swelling, arthralgias and gait problem.  Skin: Positive for rash.  Neurological: Negative for dizziness, syncope, speech difficulty, weakness, numbness and headaches.  Hematological: Negative for adenopathy.  Psychiatric/Behavioral: Negative for behavioral problems, dysphoric mood and agitation. The patient is not nervous/anxious.        Objective:   Physical Exam  Constitutional: She is oriented to person, place, and time. She appears well-developed and well-nourished.   HENT:  Head: Normocephalic.  Right Ear: External ear normal.  Left Ear: External ear normal.  Mouth/Throat: Oropharynx is clear and moist.  Eyes: Conjunctivae and EOM are normal. Pupils are equal, round, and reactive to light.  Neck: Normal range of motion. Neck supple. No thyromegaly present.  Cardiovascular: Normal rate, regular rhythm, normal heart sounds and intact distal pulses.   Pulmonary/Chest: Effort normal and breath sounds normal. No respiratory distress. She has no wheezes. She has no rales.  Abdominal: Soft. Bowel sounds are normal. She exhibits no mass. There is no tenderness.  Musculoskeletal: Normal range of motion.  Lymphadenopathy:    She has no cervical adenopathy.  Neurological: She is alert and oriented to person, place, and time.  Skin: Skin is warm and dry. No rash noted.  Nonspecific erythema involving the right upper back area near the bra line  Psychiatric: She has a normal mood and affect. Her behavior is normal.          Assessment & Plan:   PMR.  Will check sedimentation rate Nonspecific dermatitis.  Local moisturizing cream or topical 1% steroids recommended.  Will call if unimproved  CPX 6 months

## 2014-12-25 NOTE — Progress Notes (Signed)
Pre visit review using our clinic review tool, if applicable. No additional management support is needed unless otherwise documented below in the visit note. 

## 2014-12-28 MED ORDER — ZOLPIDEM TARTRATE 5 MG PO TABS: 5.0000 mg | ORAL_TABLET | Freq: Every evening | ORAL | Status: DC | PRN

## 2014-12-28 NOTE — Telephone Encounter (Signed)
Rx for Zolpidem faxed to [facility].

## 2015-02-04 DIAGNOSIS — M353 Polymyalgia rheumatica: Secondary | ICD-10-CM | POA: Diagnosis not present

## 2015-02-04 DIAGNOSIS — M25512 Pain in left shoulder: Secondary | ICD-10-CM | POA: Diagnosis not present

## 2015-02-04 DIAGNOSIS — R700 Elevated erythrocyte sedimentation rate: Secondary | ICD-10-CM | POA: Diagnosis not present

## 2015-02-04 DIAGNOSIS — M315 Giant cell arteritis with polymyalgia rheumatica: Secondary | ICD-10-CM | POA: Diagnosis not present

## 2015-02-04 DIAGNOSIS — Z79899 Other long term (current) drug therapy: Secondary | ICD-10-CM | POA: Diagnosis not present

## 2015-02-04 DIAGNOSIS — M25511 Pain in right shoulder: Secondary | ICD-10-CM | POA: Diagnosis not present

## 2015-02-04 LAB — BASIC METABOLIC PANEL
BUN: 18 mg/dL (ref 4–21)
Creatinine: 0.5 mg/dL (ref 0.5–1.1)
Glucose: 84 mg/dL
POTASSIUM: 4.3 mmol/L (ref 3.4–5.3)
Sodium: 139 mmol/L (ref 137–147)

## 2015-02-04 LAB — CBC AND DIFFERENTIAL
HCT: 38 % (ref 36–46)
Hemoglobin: 12.2 g/dL (ref 12.0–16.0)
Platelets: 261 10*3/uL (ref 150–399)
WBC: 6.6 10*3/mL

## 2015-02-04 LAB — HEPATIC FUNCTION PANEL
ALT: 13 U/L (ref 7–35)
AST: 21 U/L (ref 13–35)
Alkaline Phosphatase: 67 U/L (ref 25–125)
Bilirubin, Total: 0.6 mg/dL

## 2015-03-11 DIAGNOSIS — R700 Elevated erythrocyte sedimentation rate: Secondary | ICD-10-CM | POA: Diagnosis not present

## 2015-03-11 DIAGNOSIS — R54 Age-related physical debility: Secondary | ICD-10-CM | POA: Diagnosis not present

## 2015-03-11 DIAGNOSIS — M315 Giant cell arteritis with polymyalgia rheumatica: Secondary | ICD-10-CM | POA: Diagnosis not present

## 2015-03-11 DIAGNOSIS — Z79899 Other long term (current) drug therapy: Secondary | ICD-10-CM | POA: Diagnosis not present

## 2015-03-19 ENCOUNTER — Encounter: Payer: Self-pay | Admitting: Internal Medicine

## 2015-05-04 DIAGNOSIS — Z23 Encounter for immunization: Secondary | ICD-10-CM | POA: Diagnosis not present

## 2015-05-21 ENCOUNTER — Ambulatory Visit (INDEPENDENT_AMBULATORY_CARE_PROVIDER_SITE_OTHER): Payer: Medicare Other | Admitting: Adult Health

## 2015-05-21 ENCOUNTER — Encounter: Payer: Self-pay | Admitting: Adult Health

## 2015-05-21 VITALS — BP 138/88 | Temp 98.1°F | Ht 63.0 in | Wt 118.9 lb

## 2015-05-21 DIAGNOSIS — R319 Hematuria, unspecified: Secondary | ICD-10-CM | POA: Diagnosis not present

## 2015-05-21 DIAGNOSIS — N3001 Acute cystitis with hematuria: Secondary | ICD-10-CM | POA: Diagnosis not present

## 2015-05-21 LAB — POCT URINALYSIS DIPSTICK
BILIRUBIN UA: NEGATIVE
GLUCOSE UA: NEGATIVE
Ketones, UA: NEGATIVE
NITRITE UA: NEGATIVE
Spec Grav, UA: 1.025
UROBILINOGEN UA: 0.2
pH, UA: 6

## 2015-05-21 MED ORDER — CIPROFLOXACIN HCL 500 MG PO TABS: 500.0000 mg | ORAL_TABLET | Freq: Two times a day (BID) | ORAL | Status: DC

## 2015-05-21 NOTE — Patient Instructions (Signed)

## 2015-05-21 NOTE — Progress Notes (Signed)
  PCP:Nyoka Cowden, MD Chief Complaint  Patient presents with  . Hematuria    Current Issues:  Presents with 3 days of urinary urgency and urinary frequency Associated symptoms include:  hematuria, urinary frequency and urinary urgency She endorses waking up this morning and noticed blood in her urine. She has not noticed any since. She has been trying to drink water but does not drink enough.   There is no history of of similar symptoms. Sexually active:  No   No concern for STI.  Prior to Admission medications   Medication Sig Start Date End Date Taking? Authorizing Provider  Calcium Carbonate-Vitamin D (CALCIUM-VITAMIN D) 600-200 MG-UNIT CAPS Take by mouth daily.     Yes Historical Provider, MD  fish oil-omega-3 fatty acids 1000 MG capsule Take 2 g by mouth daily.     Yes Historical Provider, MD  fluticasone (FLONASE) 50 MCG/ACT nasal spray Place 2 sprays into both nostrils daily. 11/16/14 11/16/19 Yes Marletta Lor, MD  Melatonin 3 MG TABS Take 1 tablet by mouth at bedtime.   Yes Historical Provider, MD  Multiple Vitamins-Minerals (PRESERVISION AREDS 2 PO) Take 1 tablet by mouth 2 (two) times daily.   Yes Historical Provider, MD  omeprazole (PRILOSEC) 20 MG capsule Take 1 capsule (20 mg total) by mouth daily. 03/17/14  Yes Marletta Lor, MD  predniSONE (DELTASONE) 5 MG tablet Take 2.5 mg by mouth daily with breakfast.  09/23/14  Yes Historical Provider, MD  zolpidem (AMBIEN) 5 MG tablet Take 1 tablet (5 mg total) by mouth at bedtime as needed. 12/28/14 06/27/19 Yes Marletta Lor, MD    Review of Systems:   PE:  BP 138/88 mmHg  Temp(Src) 98.1 F (36.7 C) (Oral)  Ht 5\' 3"  (1.6 m)  Wt 118 lb 14.4 oz (53.933 kg)  BMI 21.07 kg/m2 Constitutional Alert and orientated. Appears well hydrated and in no acute distress Heart: Normal rate, normal rhythm. No murmurs, rubs or gallops Lungs: Clear to ausculation. No respiratory distress Back: No CVA tenderness Abdomen:  No pain with palpation. No distension. No masses   Results for orders placed or performed in visit on 03/19/15  CBC and differential  Result Value Ref Range   Hemoglobin 12.2 12.0 - 16.0 g/dL   HCT 38 36 - 46 %   Platelets 261 150 - 399 K/L   WBC 6.6 94^4/HQ  Basic metabolic panel  Result Value Ref Range   Glucose 84 mg/dL   BUN 18 4 - 21 mg/dL   Creatinine 0.5 0.5 - 1.1 mg/dL   Potassium 4.3 3.4 - 5.3 mmol/L   Sodium 139 137 - 147 mmol/L  Hepatic function panel  Result Value Ref Range   Alkaline Phosphatase 67 25 - 125 U/L   ALT 13 7 - 35 U/L   AST 21 13 - 35 U/L   Bilirubin, Total 0.6 mg/dL    Assessment and Plan:    1. Hematuria - POCT urinalysis dipstick; Standing - POCT urinalysis dipstick   2. Acute cystitis with hematuria [N30.01] - ciprofloxacin (CIPRO) 500 MG tablet; Take 1 tablet (500 mg total) by mouth 2 (two) times daily.  Dispense: 6 tablet; Refill: 0 - Drink plenty of water - Follow up if you continue to have blood in the urine after finishing the abx

## 2015-05-21 NOTE — Addendum Note (Signed)
Addended by: Colleen Can on: 05/21/2015 03:41 PM   Modules accepted: Orders

## 2015-05-23 LAB — URINE CULTURE

## 2015-05-25 ENCOUNTER — Other Ambulatory Visit: Payer: Self-pay

## 2015-05-25 DIAGNOSIS — Z1231 Encounter for screening mammogram for malignant neoplasm of breast: Secondary | ICD-10-CM

## 2015-06-07 ENCOUNTER — Encounter: Payer: Medicare Other | Admitting: Internal Medicine

## 2015-06-10 DIAGNOSIS — M315 Giant cell arteritis with polymyalgia rheumatica: Secondary | ICD-10-CM | POA: Diagnosis not present

## 2015-06-10 DIAGNOSIS — M545 Low back pain: Secondary | ICD-10-CM | POA: Diagnosis not present

## 2015-06-10 DIAGNOSIS — R700 Elevated erythrocyte sedimentation rate: Secondary | ICD-10-CM | POA: Diagnosis not present

## 2015-06-21 ENCOUNTER — Encounter: Payer: Self-pay | Admitting: Internal Medicine

## 2015-06-21 ENCOUNTER — Ambulatory Visit (INDEPENDENT_AMBULATORY_CARE_PROVIDER_SITE_OTHER): Payer: Medicare Other | Admitting: Internal Medicine

## 2015-06-21 VITALS — BP 130/78 | HR 85 | Temp 98.1°F | Resp 18 | Ht 62.5 in | Wt 117.0 lb

## 2015-06-21 DIAGNOSIS — M810 Age-related osteoporosis without current pathological fracture: Secondary | ICD-10-CM

## 2015-06-21 DIAGNOSIS — K219 Gastro-esophageal reflux disease without esophagitis: Secondary | ICD-10-CM | POA: Diagnosis not present

## 2015-06-21 DIAGNOSIS — E785 Hyperlipidemia, unspecified: Secondary | ICD-10-CM

## 2015-06-21 DIAGNOSIS — Z0000 Encounter for general adult medical examination without abnormal findings: Secondary | ICD-10-CM | POA: Diagnosis not present

## 2015-06-21 DIAGNOSIS — M353 Polymyalgia rheumatica: Secondary | ICD-10-CM

## 2015-06-21 LAB — COMPREHENSIVE METABOLIC PANEL
ALK PHOS: 54 U/L (ref 39–117)
ALT: 18 U/L (ref 0–35)
AST: 24 U/L (ref 0–37)
Albumin: 4.2 g/dL (ref 3.5–5.2)
BILIRUBIN TOTAL: 0.7 mg/dL (ref 0.2–1.2)
BUN: 20 mg/dL (ref 6–23)
CO2: 28 mEq/L (ref 19–32)
CREATININE: 0.64 mg/dL (ref 0.40–1.20)
Calcium: 9.6 mg/dL (ref 8.4–10.5)
Chloride: 103 mEq/L (ref 96–112)
GFR: 95.64 mL/min (ref >60.00)
GLUCOSE: 78 mg/dL (ref 70–99)
Potassium: 4.2 mEq/L (ref 3.5–5.1)
Sodium: 142 mEq/L (ref 135–145)
TOTAL PROTEIN: 7.2 g/dL (ref 6.0–8.3)

## 2015-06-21 LAB — LIPID PANEL
CHOLESTEROL: 268 mg/dL — AB (ref 0–200)
HDL: 75.1 mg/dL (ref >39.00)
LDL CALC: 171 mg/dL — AB (ref 0–99)
NonHDL: 192.52
Total CHOL/HDL Ratio: 4
Triglycerides: 109 mg/dL (ref 0.0–149.0)
VLDL: 21.8 mg/dL (ref 0.0–40.0)

## 2015-06-21 LAB — CBC WITH DIFFERENTIAL/PLATELET
BASOS PCT: 0.6 % (ref 0.0–3.0)
Basophils Absolute: 0 10*3/uL (ref 0.0–0.1)
EOS PCT: 0.9 % (ref 0.0–5.0)
Eosinophils Absolute: 0 10*3/uL (ref 0.0–0.7)
HCT: 40.4 % (ref 36.0–46.0)
Hemoglobin: 13.3 g/dL (ref 12.0–15.0)
LYMPHS ABS: 1.9 10*3/uL (ref 0.7–4.0)
Lymphocytes Relative: 37.7 % (ref 12.0–46.0)
MCHC: 33 g/dL (ref 30.0–36.0)
MCV: 93.1 fl (ref 78.0–100.0)
MONO ABS: 0.4 10*3/uL (ref 0.1–1.0)
MONOS PCT: 7.6 % (ref 3.0–12.0)
NEUTROS ABS: 2.7 10*3/uL (ref 1.4–7.7)
NEUTROS PCT: 53.2 % (ref 43.0–77.0)
PLATELETS: 232 10*3/uL (ref 150.0–400.0)
RBC: 4.34 Mil/uL (ref 3.87–5.11)
RDW: 13.9 % (ref 11.5–15.5)
WBC: 5 10*3/uL (ref 4.0–10.5)

## 2015-06-21 LAB — TSH: TSH: 2.29 u[IU]/mL (ref 0.35–4.50)

## 2015-06-21 LAB — SEDIMENTATION RATE: Sed Rate: 28 mm/hr — ABNORMAL HIGH (ref 0–22)

## 2015-06-21 NOTE — Progress Notes (Signed)
Pre visit review using our clinic review tool, if applicable. No additional management support is needed unless otherwise documented below in the visit note. 

## 2015-06-21 NOTE — Patient Instructions (Addendum)
It is important that you exercise regularly, at least 20 minutes 3 to 4 times per week.  If you develop chest pain or shortness of breath seek  medical attention.  Limit your sodium (Salt) intake  Return in 6 months for follow-up  Take a calcium supplement, plus [id number] units of vitamin DMenopause is a normal process in which your reproductive ability comes to an end. This process happens gradually over a span of months to years, usually between the ages of 21 and 43. Menopause is complete when you have missed 12 consecutive menstrual periods. It is important to talk with your health care provider about some of the most common conditions that affect postmenopausal women, such as heart disease, cancer, and bone loss (osteoporosis). Adopting a healthy lifestyle and getting preventive care can help to promote your health and wellness. Those actions can also lower your chances of developing some of these common conditions. WHAT SHOULD I KNOW ABOUT MENOPAUSE? During menopause, you may experience a number of symptoms, such as:  Moderate-to-severe hot flashes.  Night sweats.  Decrease in sex drive.  Mood swings.  Headaches.  Tiredness.  Irritability.  Memory problems.  Insomnia. Choosing to treat or not to treat menopausal changes is an individual decision that you make with your health care provider. WHAT SHOULD I KNOW ABOUT HORMONE REPLACEMENT THERAPY AND SUPPLEMENTS? Hormone therapy products are effective for treating symptoms that are associated with menopause, such as hot flashes and night sweats. Hormone replacement carries certain risks, especially as you become older. If you are thinking about using estrogen or estrogen with progestin treatments, discuss the benefits and risks with your health care provider. WHAT SHOULD I KNOW ABOUT HEART DISEASE AND STROKE? Heart disease, heart attack, and stroke become more likely as you age. This may be due, in part, to the hormonal changes  that your body experiences during menopause. These can affect how your body processes dietary fats, triglycerides, and cholesterol. Heart attack and stroke are both medical emergencies. There are many things that you can do to help prevent heart disease and stroke:  Have your blood pressure checked at least every 1-2 years. High blood pressure causes heart disease and increases the risk of stroke.  If you are 68-54 years old, ask your health care provider if you should take aspirin to prevent a heart attack or a stroke.  Do not use any tobacco products, including cigarettes, chewing tobacco, or electronic cigarettes. If you need help quitting, ask your health care provider.  It is important to eat a healthy diet and maintain a healthy weight.  Be sure to include plenty of vegetables, fruits, low-fat dairy products, and lean protein.  Avoid eating foods that are high in solid fats, added sugars, or salt (sodium).  Get regular exercise. This is one of the most important things that you can do for your health.  Try to exercise for at least 150 minutes each week. The type of exercise that you do should increase your heart rate and make you sweat. This is known as moderate-intensity exercise.  Try to do strengthening exercises at least twice each week. Do these in addition to the moderate-intensity exercise.  Know your numbers.Ask your health care provider to check your cholesterol and your blood glucose. Continue to have your blood tested as directed by your health care provider. WHAT SHOULD I KNOW ABOUT CANCER SCREENING? There are several types of cancer. Take the following steps to reduce your risk and to catch any cancer development  as early as possible. Breast Cancer  Practice breast self-awareness.  This means understanding how your breasts normally appear and feel.  It also means doing regular breast self-exams. Let your health care provider know about any changes, no matter how  small.  If you are 8 or older, have a clinician do a breast exam (clinical breast exam or CBE) every year. Depending on your age, family history, and medical history, it may be recommended that you also have a yearly breast X-ray (mammogram).  If you have a family history of breast cancer, talk with your health care provider about genetic screening.  If you are at high risk for breast cancer, talk with your health care provider about having an MRI and a mammogram every year.  Breast cancer (BRCA) gene test is recommended for women who have family members with BRCA-related cancers. Results of the assessment will determine the need for genetic counseling and BRCA1 and for BRCA2 testing. BRCA-related cancers include these types:  Breast. This occurs in males or females.  Ovarian.  Tubal. This may also be called fallopian tube cancer.  Cancer of the abdominal or pelvic lining (peritoneal cancer).  Prostate.  Pancreatic. Cervical, Uterine, and Ovarian Cancer Your health care provider may recommend that you be screened regularly for cancer of the pelvic organs. These include your ovaries, uterus, and vagina. This screening involves a pelvic exam, which includes checking for microscopic changes to the surface of your cervix (Pap test).  For women ages 21-65, health care providers may recommend a pelvic exam and a Pap test every three years. For women ages 37-65, they may recommend the Pap test and pelvic exam, combined with testing for human papilloma virus (HPV), every five years. Some types of HPV increase your risk of cervical cancer. Testing for HPV may also be done on women of any age who have unclear Pap test results.  Other health care providers may not recommend any screening for nonpregnant women who are considered low risk for pelvic cancer and have no symptoms. Ask your health care provider if a screening pelvic exam is right for you.  If you have had past treatment for cervical  cancer or a condition that could lead to cancer, you need Pap tests and screening for cancer for at least 20 years after your treatment. If Pap tests have been discontinued for you, your risk factors (such as having a new sexual partner) need to be reassessed to determine if you should start having screenings again. Some women have medical problems that increase the chance of getting cervical cancer. In these cases, your health care provider may recommend that you have screening and Pap tests more often.  If you have a family history of uterine cancer or ovarian cancer, talk with your health care provider about genetic screening.  If you have vaginal bleeding after reaching menopause, tell your health care provider.  There are currently no reliable tests available to screen for ovarian cancer. Lung Cancer Lung cancer screening is recommended for adults 33-35 years old who are at high risk for lung cancer because of a history of smoking. A yearly low-dose CT scan of the lungs is recommended if you:  Currently smoke.  Have a history of at least 30 pack-years of smoking and you currently smoke or have quit within the past 15 years. A pack-year is smoking an average of one pack of cigarettes per day for one year. Yearly screening should:  Continue until it has been 15 years  since you quit.  Stop if you develop a health problem that would prevent you from having lung cancer treatment. Colorectal Cancer  This type of cancer can be detected and can often be prevented.  Routine colorectal cancer screening usually begins at age 21 and continues through age 71.  If you have risk factors for colon cancer, your health care provider may recommend that you be screened at an earlier age.  If you have a family history of colorectal cancer, talk with your health care provider about genetic screening.  Your health care provider may also recommend using home test kits to check for hidden blood in your  stool.  A small camera at the end of a tube can be used to examine your colon directly (sigmoidoscopy or colonoscopy). This is done to check for the earliest forms of colorectal cancer.  Direct examination of the colon should be repeated every 5-10 years until age 62. However, if early forms of precancerous polyps or small growths are found or if you have a family history or genetic risk for colorectal cancer, you may need to be screened more often. Skin Cancer  Check your skin from head to toe regularly.  Monitor any moles. Be sure to tell your health care provider:  About any new moles or changes in moles, especially if there is a change in a mole's shape or color.  If you have a mole that is larger than the size of a pencil eraser.  If any of your family members has a history of skin cancer, especially at a young age, talk with your health care provider about genetic screening.  Always use sunscreen. Apply sunscreen liberally and repeatedly throughout the day.  Whenever you are outside, protect yourself by wearing long sleeves, pants, a wide-brimmed hat, and sunglasses. WHAT SHOULD I KNOW ABOUT OSTEOPOROSIS? Osteoporosis is a condition in which bone destruction happens more quickly than new bone creation. After menopause, you may be at an increased risk for osteoporosis. To help prevent osteoporosis or the bone fractures that can happen because of osteoporosis, the following is recommended:  If you are 76-35 years old, get at least 1,000 mg of calcium and at least 600 mg of vitamin D per day.  If you are older than age 76 but younger than age 47, get at least 1,200 mg of calcium and at least 600 mg of vitamin D per day.  If you are older than age 11, get at least 1,200 mg of calcium and at least 800 mg of vitamin D per day. Smoking and excessive alcohol intake increase the risk of osteoporosis. Eat foods that are rich in calcium and vitamin D, and do weight-bearing exercises several  times each week as directed by your health care provider. WHAT SHOULD I KNOW ABOUT HOW MENOPAUSE AFFECTS [place]? Depression may occur at any age, but it is more common as you become older. Common symptoms of depression include:  Low or sad mood.  Changes in sleep patterns.  Changes in appetite or eating patterns.  Feeling an overall lack of motivation or enjoyment of activities that you previously enjoyed.  Frequent crying spells. Talk with your health care provider if you think that you are experiencing depression. WHAT SHOULD I KNOW ABOUT IMMUNIZATIONS? It is important that you get and maintain your immunizations. These include:  Tetanus, diphtheria, and pertussis (Tdap) booster vaccine.  Influenza every year before the flu season begins.  Pneumonia vaccine.  Shingles vaccine. Your health care  provider may also recommend other immunizations.   This information is not intended to replace advice given to you by your health care provider. Make sure you discuss any questions you have with your health care provider.   Document Released: 09/01/2005 Document Revised: 07/31/2014 Document Reviewed: 03/12/2014 Elsevier Interactive Patient Education [employer].

## 2015-06-21 NOTE — Progress Notes (Signed)
Subjective:    Patient ID: Savannah Jimenez, female    DOB: 1936-07-03, 79 y.o.   MRN: [id number]  HPI   Subjective:    Patient ID: Savannah Jimenez, female    DOB: 1936/02/08, 79 y.o.   MRN: [id number]  HPI Pre-visit discussion using our clinic review tool. No additional management support is needed unless otherwise documented below in the visit note.  CC: cpx - doing well .   History of Present Illness:   79   year-old patient who is seen today for a comprehensive evaluation.  She has a history of osteoporosis, gastroesophageal reflux disease, and allergic rhinitis. She has  been diagnosed with POLYmyalgia rheumatica and remains on low-dose prednisone therapy. She does have a history of osteoporosis and has been treated with Boniva and Fosamax. She remains on calcium and vitamin D supplements. She is doing well today. She has been on biphosphonate for greater than 5 years.  Bone density studies have revealed osteopenia but not frank osteoporosis   Here for Medicare AWV:   1. Risk factors based on Past M, S, F history: no cardiovascular risk factors  2. Physical Activities: remains quite active physically, including a health club 3 times per week  3. Depression/mood: history depression, or mood disorder  4. Hearing: no hearing deficits  5. ADL's: independent in all aspects of daily living  6. Fall Risk: low  7. Home Safety: no problems identified  8. Height, weight, &visual acuity:height and weight stable. No difficulty with visual acuity  9 Counseling- heart healthy diet regular. Exercise regimen encouraged as well as calcium and vitamin D supplementation  10. Labs ordered based on risk factors: complete laboratory profile including lipid panel will be reviewed  11. Referral Coordination- will have mammogram performed in the spring  12. Care Plan- continue heart healthy diet regular. Exercise. Plan  13. Cognitive Assessment- alert and oriented, with normal affect. No memory  disturbance, and [place], executive functioning without difficulty  14.  Preventive services will include annual health assessments with screening lab.  Annual mammograms and I examinations will be encouraged.  Will follow serial colonoscopies and bone density studies.  Patient was provided with a written and personalized care plan 15.  Provider list updated.  Includes ophthalmology primary care.  GI and radiology Allergies:  1) ! Penicillin G Pot in Dextrose (Penicillin G Potassium in D5w)  2) ! Sulf-10   Past History:  Past Medical History:   Allergic rhinitis  GERD  Osteoporosis  Polymyalgia rheumatica   Past Surgical History:   Hysterectomy  Tubal ligation  colonoscopy 2007   Family History:   both parents died at age 11. Father from sepsis.  Mother of complications of heart failure  one brother one sister both have hypertension.  Sister on chronic dialysis, SLE, CAD, Asthma   Social History:    Married  Never Smoked  Regular exercise-yes    Past Medical History  Diagnosis Date  . Allergy   . GERD (gastroesophageal reflux disease)   . Osteoporosis   . Arthritis   . HOH (hard of hearing)     Social History   Social History  . Marital Status: Married    Spouse Name: N/A  . Number of Children: N/A  . Years of Education: N/A   Occupational History  . Not on file.   Social History Main Topics  . Smoking status: Never Smoker   . Smokeless tobacco: Never Used  . Alcohol Use: Yes  Comment: rarely  . Drug Use: No  . Sexual Activity: Not on file   Other Topics Concern  . Not on file   Social History Narrative    Past Surgical History  Procedure Laterality Date  . Abdominal hysterectomy    . Tubal ligation    . Colonscopy  2007  . Cataract extraction  10/2011    Left  . Inner ear surgery  1974    both stapes replaced-steal  . Artery biopsy Right 10/06/2014    Procedure: BIOPSY TEMPORAL ARTERY;  Surgeon: Armandina Gemma, MD;  Location: [facility];  Service: General;  Laterality: Right;    Family History  Problem Relation Age of Onset  . Heart failure Mother   . Hypertension Sister   . Asthma Sister   . Hypertension Brother     Allergies  Allergen Reactions  . Tetanus Toxoids     Arm swelling   . Penicillins   . Sulfacetamide Sodium     Current Outpatient Prescriptions on File Prior to Visit  Medication Sig Dispense Refill  . fish oil-omega-3 fatty acids 1000 MG capsule Take 2 g by mouth daily.      . fluticasone (FLONASE) 50 MCG/ACT nasal spray Place 2 sprays into both nostrils daily. 16 g 6  . Melatonin 3 MG TABS Take 1 tablet by mouth at bedtime.    . Multiple Vitamins-Minerals (PRESERVISION AREDS 2 PO) Take 1 tablet by mouth 3 (three) times a week.     . predniSONE (DELTASONE) 5 MG tablet Take 2.5 mg by mouth daily with breakfast.     . zolpidem (AMBIEN) 5 MG tablet Take 1 tablet (5 mg total) by mouth at bedtime as needed. 90 tablet 1   No current facility-administered medications on file prior to visit.    BP 130/78 mmHg  Pulse 85  Temp(Src) 98.1 F (36.7 C) (Oral)  Resp 18  Ht 5' 2.5" (1.588 m)  Wt 117 lb (53.071 kg)  BMI 21.05 kg/m2  SpO2 97%      Review of Systems  Constitutional: Negative for fever, appetite change, fatigue and unexpected weight change.  HENT: Negative for congestion, dental problem, ear pain, hearing loss, mouth sores, nosebleeds, sinus pressure, sore throat, tinnitus, trouble swallowing and voice change.   Eyes: Negative for photophobia, pain, redness and visual disturbance.  Respiratory: Negative for cough, chest tightness and shortness of breath.   Cardiovascular: Negative for chest pain, palpitations and leg swelling.  Gastrointestinal: Negative for nausea, vomiting, abdominal pain, diarrhea, constipation, blood in stool, abdominal distention and rectal pain.  Genitourinary: Negative for dysuria, urgency, frequency, hematuria, flank pain, vaginal bleeding,  vaginal discharge, difficulty urinating, genital sores, vaginal pain, menstrual problem and pelvic pain.  Musculoskeletal: Negative for back pain, arthralgias and neck stiffness.  Skin: Negative for rash.  Neurological: Negative for dizziness, syncope, speech difficulty, weakness, light-headedness, numbness and headaches.  Hematological: Negative for adenopathy. Does not bruise/bleed easily.  Psychiatric/Behavioral: Negative for suicidal ideas, behavioral problems, self-injury, dysphoric mood and agitation. The patient is not nervous/anxious.        Objective:   Physical Exam  Constitutional: She is oriented to person, place, and time. She appears well-developed and well-nourished.  HENT:  Head: Normocephalic and atraumatic.  Right Ear: External ear normal.  Left Ear: External ear normal.  Mouth/Throat: Oropharynx is clear and moist.  Eyes: Conjunctivae and EOM are normal.  Neck: Normal range of motion. Neck supple. No JVD present. No thyromegaly present.  Cardiovascular: Normal  rate, regular rhythm, normal heart sounds and intact distal pulses.   No murmur heard. Pulmonary/Chest: Effort normal and breath sounds normal. She has no wheezes. She has no rales.  Abdominal: Soft. Bowel sounds are normal. She exhibits no distension and no mass. There is no tenderness. There is no rebound and no guarding.  Musculoskeletal: Normal range of motion. She exhibits no edema or tenderness.  Neurological: She is alert and oriented to person, place, and time. She has normal reflexes. No cranial nerve deficit. She exhibits normal muscle tone. Coordination normal.  Skin: Skin is warm and dry. No rash noted.  Psychiatric: She has a normal mood and affect. Her behavior is normal.          Assessment & Plan   Preventative health examination Osteoporosis. We'll continue calcium and vitamin D supplements. We'll check a bone density in one year.  Polymyalgia rheumatica. Check a sedimentation rate.   Hopeful to further taper prednisone therapy.  Presently on 2.5 milligrams daily.  If sedimentation rate is normal.  Will perform very slow taper.  Patient has failed to more abrupt discontinuations in the past Gastroesophageal reflux disease. Stable on present regimen  Recheck 6 months    Review of Systems    as above Objective:   Physical Exam  As above      Assessment & Plan:   As above

## 2015-06-23 ENCOUNTER — Encounter: Payer: Self-pay | Admitting: Internal Medicine

## 2015-07-22 ENCOUNTER — Encounter: Payer: Self-pay | Admitting: *Deleted

## 2015-07-26 DIAGNOSIS — H40013 Open angle with borderline findings, low risk, bilateral: Secondary | ICD-10-CM | POA: Diagnosis not present

## 2015-07-26 DIAGNOSIS — H43821 Vitreomacular adhesion, right eye: Secondary | ICD-10-CM | POA: Diagnosis not present

## 2015-07-26 DIAGNOSIS — H353110 Nonexudative age-related macular degeneration, right eye, stage unspecified: Secondary | ICD-10-CM | POA: Diagnosis not present

## 2015-07-26 DIAGNOSIS — H43822 Vitreomacular adhesion, left eye: Secondary | ICD-10-CM | POA: Diagnosis not present

## 2015-08-19 DIAGNOSIS — L821 Other seborrheic keratosis: Secondary | ICD-10-CM | POA: Diagnosis not present

## 2015-08-19 DIAGNOSIS — L570 Actinic keratosis: Secondary | ICD-10-CM | POA: Diagnosis not present

## 2015-08-19 DIAGNOSIS — L723 Sebaceous cyst: Secondary | ICD-10-CM | POA: Diagnosis not present

## 2015-08-19 DIAGNOSIS — Z85828 Personal history of other malignant neoplasm of skin: Secondary | ICD-10-CM | POA: Diagnosis not present

## 2015-08-19 DIAGNOSIS — Z23 Encounter for immunization: Secondary | ICD-10-CM | POA: Diagnosis not present

## 2015-08-19 DIAGNOSIS — L308 Other specified dermatitis: Secondary | ICD-10-CM | POA: Diagnosis not present

## 2015-09-29 ENCOUNTER — Encounter: Payer: Self-pay | Admitting: Internal Medicine

## 2015-09-30 MED ORDER — PREDNISONE 5 MG PO TABS: 2.5000 mg | ORAL_TABLET | Freq: Every day | ORAL | Status: DC

## 2015-10-20 ENCOUNTER — Ambulatory Visit: Payer: Medicare Other

## 2015-10-21 ENCOUNTER — Ambulatory Visit
Admission: RE | Admit: 2015-10-21 | Discharge: 2015-10-21 | Disposition: A | Discharge status: Home / Self Care | Payer: Medicare Other | Source: Ambulatory Visit

## 2015-10-21 DIAGNOSIS — Z1231 Encounter for screening mammogram for malignant neoplasm of breast: Secondary | ICD-10-CM

## 2015-11-10 ENCOUNTER — Ambulatory Visit (INDEPENDENT_AMBULATORY_CARE_PROVIDER_SITE_OTHER): Payer: Medicare Other | Admitting: Ophthalmology

## 2015-11-10 DIAGNOSIS — H43813 Vitreous degeneration, bilateral: Secondary | ICD-10-CM

## 2015-11-10 DIAGNOSIS — H35342 Macular cyst, hole, or pseudohole, left eye: Secondary | ICD-10-CM | POA: Diagnosis not present

## 2015-11-10 DIAGNOSIS — H43822 Vitreomacular adhesion, left eye: Secondary | ICD-10-CM

## 2015-11-10 DIAGNOSIS — H35363 Drusen (degenerative) of macula, bilateral: Secondary | ICD-10-CM

## 2015-11-26 DIAGNOSIS — H00024 Hordeolum internum left upper eyelid: Secondary | ICD-10-CM | POA: Diagnosis not present

## 2015-12-21 ENCOUNTER — Encounter: Payer: Self-pay | Admitting: Internal Medicine

## 2015-12-21 ENCOUNTER — Ambulatory Visit (INDEPENDENT_AMBULATORY_CARE_PROVIDER_SITE_OTHER): Payer: Medicare Other | Admitting: Internal Medicine

## 2015-12-21 VITALS — BP 146/82 | HR 80 | Temp 97.8°F | Ht 62.5 in | Wt 121.0 lb

## 2015-12-21 DIAGNOSIS — M353 Polymyalgia rheumatica: Secondary | ICD-10-CM

## 2015-12-21 DIAGNOSIS — M810 Age-related osteoporosis without current pathological fracture: Secondary | ICD-10-CM | POA: Diagnosis not present

## 2015-12-21 DIAGNOSIS — J3089 Other allergic rhinitis: Secondary | ICD-10-CM | POA: Diagnosis not present

## 2015-12-21 LAB — SEDIMENTATION RATE: SED RATE: 10 mm/h (ref 0–30)

## 2015-12-21 NOTE — Patient Instructions (Signed)
Return in 6 months for follow-up No change in medical regimen

## 2015-12-21 NOTE — Progress Notes (Signed)
Pre visit review using our clinic review tool, if applicable. No additional management support is needed unless otherwise documented below in the visit note. 

## 2015-12-21 NOTE — Progress Notes (Signed)
Subjective:    Patient ID: Savannah Jimenez, female    DOB: Nov 25, 1935, 80 y.o.   MRN: [id number]  HPI  80 year old patient who is seen today for her six-month follow-up.  She has a history of polymyalgia rheumatica and her prednisone dose has been tapered to 2.5 milligrams daily.  She is doing fairly well at this dose.  She has a history of insomnia and does take melatonin and when necessary Ambien.  She has allergic rhinitis and uses fluticasone nasal spray.  In general doing well. She suffered atraumatic wound to the left arm recently. She has occasional mild pain involving primarily her thighs that she attributes to PMR.  Very reluctant to further taper prednisone  Past Medical History  Diagnosis Date  . Allergy   . GERD (gastroesophageal reflux disease)   . Osteoporosis   . Arthritis   . HOH (hard of hearing)      Social History   Social History  . Marital Status: Married    Spouse Name: N/A  . Number of Children: N/A  . Years of Education: N/A   Occupational History  . Not on file.   Social History Main Topics  . Smoking status: Never Smoker   . Smokeless tobacco: Never Used  . Alcohol Use: Yes     Comment: rarely  . Drug Use: No  . Sexual Activity: Not on file   Other Topics Concern  . Not on file   Social History Narrative    Past Surgical History  Procedure Laterality Date  . Abdominal hysterectomy    . Tubal ligation    . Colonscopy  2007  . Cataract extraction  10/2011    Left  . Inner ear surgery  1974    both stapes replaced-steal  . Artery biopsy Right 10/06/2014    Procedure: BIOPSY TEMPORAL ARTERY;  Surgeon: Armandina Gemma, MD;  Location: [facility];  Service: General;  Laterality: Right;    Family History  Problem Relation Age of Onset  . Heart failure Mother   . Hypertension Sister   . Asthma Sister   . Hypertension Brother     Allergies  Allergen Reactions  . Tetanus Toxoids     Arm swelling   . Penicillins   .  Sulfacetamide Sodium     Current Outpatient Prescriptions on File Prior to Visit  Medication Sig Dispense Refill  . fish oil-omega-3 fatty acids 1000 MG capsule Take 2 g by mouth daily.      . fluticasone (FLONASE) 50 MCG/ACT nasal spray Place 2 sprays into both nostrils daily. 16 g 6  . Melatonin 3 MG TABS Take 1 tablet by mouth at bedtime.    . Multiple Vitamins-Minerals (PRESERVISION AREDS 2 PO) Take 1 tablet by mouth 3 (three) times a week.     . predniSONE (DELTASONE) 5 MG tablet Take 0.5 tablets (2.5 mg total) by mouth daily with breakfast. 90 tablet 1  . zolpidem (AMBIEN) 5 MG tablet Take 1 tablet (5 mg total) by mouth at bedtime as needed. 90 tablet 1   No current facility-administered medications on file prior to visit.    BP 146/82 mmHg  Pulse 80  Temp(Src) 97.8 F (36.6 C) (Oral)  Ht 5' 2.5" (1.588 m)  Wt 121 lb (54.885 kg)  BMI 21.76 kg/m2  SpO2 95%     Review of Systems  Constitutional: Negative.   HENT: Negative for congestion, dental problem, hearing loss, rhinorrhea, sinus pressure, sore throat and tinnitus.  Eyes: Negative for pain, discharge and visual disturbance.  Respiratory: Negative for cough and shortness of breath.   Cardiovascular: Negative for chest pain, palpitations and leg swelling.  Gastrointestinal: Negative for nausea, vomiting, abdominal pain, diarrhea, constipation, blood in stool and abdominal distention.  Genitourinary: Negative for dysuria, urgency, frequency, hematuria, flank pain, vaginal bleeding, vaginal discharge, difficulty urinating, vaginal pain and pelvic pain.  Musculoskeletal: Positive for myalgias. Negative for joint swelling, arthralgias and gait problem.  Skin: Positive for wound. Negative for rash.  Neurological: Negative for dizziness, syncope, speech difficulty, weakness, numbness and headaches.  Hematological: Negative for adenopathy.  Psychiatric/Behavioral: Negative for behavioral problems, dysphoric mood and agitation.  The patient is not nervous/anxious.        Objective:   Physical Exam  Constitutional: She is oriented to person, place, and time. She appears well-developed and well-nourished.  HENT:  Head: Normocephalic.  Right Ear: External ear normal.  Left Ear: External ear normal.  Mouth/Throat: Oropharynx is clear and moist.  Eyes: Conjunctivae and EOM are normal. Pupils are equal, round, and reactive to light.  Neck: Normal range of motion. Neck supple. No thyromegaly present.  Cardiovascular: Normal rate, regular rhythm, normal heart sounds and intact distal pulses.   Pulmonary/Chest: Effort normal and breath sounds normal.  Abdominal: Soft. Bowel sounds are normal. She exhibits no mass. There is no tenderness.  Musculoskeletal: Normal range of motion.  Lymphadenopathy:    She has no cervical adenopathy.  Neurological: She is alert and oriented to person, place, and time.  Skin: Skin is warm and dry. No rash noted.  Left arm was examined  2.  Curvilinear skin tears noted involving the outer aspect of the left mid forearm  Psychiatric: She has a normal mood and affect. Her behavior is normal.          Assessment & Plan:   PMR.  Will check a sedimentation rate Allergic rhinitis, stable Skin tear, left arm.  Wound  cleaned and dressed   Nyoka Cowden, MD

## 2016-02-17 DIAGNOSIS — L821 Other seborrheic keratosis: Secondary | ICD-10-CM | POA: Diagnosis not present

## 2016-02-17 DIAGNOSIS — Z85828 Personal history of other malignant neoplasm of skin: Secondary | ICD-10-CM | POA: Diagnosis not present

## 2016-02-17 DIAGNOSIS — Z86018 Personal history of other benign neoplasm: Secondary | ICD-10-CM | POA: Diagnosis not present

## 2016-02-17 DIAGNOSIS — L720 Epidermal cyst: Secondary | ICD-10-CM | POA: Diagnosis not present

## 2016-02-17 DIAGNOSIS — L570 Actinic keratosis: Secondary | ICD-10-CM | POA: Diagnosis not present

## 2016-03-01 ENCOUNTER — Encounter: Payer: Self-pay | Admitting: *Deleted

## 2016-03-13 ENCOUNTER — Encounter: Payer: Self-pay | Admitting: Gastroenterology

## 2016-04-25 DIAGNOSIS — Z23 Encounter for immunization: Secondary | ICD-10-CM | POA: Diagnosis not present

## 2016-05-09 ENCOUNTER — Other Ambulatory Visit: Payer: Self-pay | Admitting: Internal Medicine

## 2016-05-09 DIAGNOSIS — Z1231 Encounter for screening mammogram for malignant neoplasm of breast: Secondary | ICD-10-CM

## 2016-06-23 ENCOUNTER — Ambulatory Visit (INDEPENDENT_AMBULATORY_CARE_PROVIDER_SITE_OTHER): Payer: Medicare Other | Admitting: Internal Medicine

## 2016-06-23 ENCOUNTER — Encounter: Payer: Self-pay | Admitting: Internal Medicine

## 2016-06-23 VITALS — BP 138/70 | HR 73 | Temp 98.3°F | Resp 18 | Ht 62.5 in | Wt 120.5 lb

## 2016-06-23 DIAGNOSIS — M810 Age-related osteoporosis without current pathological fracture: Secondary | ICD-10-CM

## 2016-06-23 DIAGNOSIS — J301 Allergic rhinitis due to pollen: Secondary | ICD-10-CM

## 2016-06-23 DIAGNOSIS — Z0000 Encounter for general adult medical examination without abnormal findings: Secondary | ICD-10-CM | POA: Diagnosis not present

## 2016-06-23 DIAGNOSIS — M353 Polymyalgia rheumatica: Secondary | ICD-10-CM | POA: Diagnosis not present

## 2016-06-23 DIAGNOSIS — E785 Hyperlipidemia, unspecified: Secondary | ICD-10-CM | POA: Diagnosis not present

## 2016-06-23 DIAGNOSIS — K219 Gastro-esophageal reflux disease without esophagitis: Secondary | ICD-10-CM | POA: Diagnosis not present

## 2016-06-23 LAB — COMPREHENSIVE METABOLIC PANEL
ALBUMIN: 4 g/dL (ref 3.5–5.2)
ALK PHOS: 54 U/L (ref 39–117)
ALT: 13 U/L (ref 0–35)
AST: 18 U/L (ref 0–37)
BUN: 17 mg/dL (ref 6–23)
CALCIUM: 9.5 mg/dL (ref 8.4–10.5)
CHLORIDE: 105 meq/L (ref 96–112)
CO2: 30 mEq/L (ref 19–32)
CREATININE: 0.6 mg/dL (ref 0.40–1.20)
GFR: 102.77 mL/min (ref >60.00)
Glucose, Bld: 84 mg/dL (ref 70–99)
POTASSIUM: 4.5 meq/L (ref 3.5–5.1)
Sodium: 143 mEq/L (ref 135–145)
Total Bilirubin: 0.7 mg/dL (ref 0.2–1.2)
Total Protein: 6.5 g/dL (ref 6.0–8.3)

## 2016-06-23 LAB — CBC WITH DIFFERENTIAL/PLATELET
BASOS ABS: 0 10*3/uL (ref 0.0–0.1)
Basophils Relative: 0.3 % (ref 0.0–3.0)
EOS ABS: 0.1 10*3/uL (ref 0.0–0.7)
Eosinophils Relative: 1.3 % (ref 0.0–5.0)
HEMATOCRIT: 37.6 % (ref 36.0–46.0)
HEMOGLOBIN: 12.6 g/dL (ref 12.0–15.0)
LYMPHS PCT: 30.1 % (ref 12.0–46.0)
Lymphs Abs: 1.3 10*3/uL (ref 0.7–4.0)
MCHC: 33.5 g/dL (ref 30.0–36.0)
MCV: 93.3 fl (ref 78.0–100.0)
Monocytes Absolute: 0.4 10*3/uL (ref 0.1–1.0)
Monocytes Relative: 8.2 % (ref 3.0–12.0)
NEUTROS ABS: 2.6 10*3/uL (ref 1.4–7.7)
Neutrophils Relative %: 60.1 % (ref 43.0–77.0)
PLATELETS: 229 10*3/uL (ref 150.0–400.0)
RBC: 4.03 Mil/uL (ref 3.87–5.11)
RDW: 13.4 % (ref 11.5–15.5)
WBC: 4.4 10*3/uL (ref 4.0–10.5)

## 2016-06-23 LAB — LIPID PANEL
CHOLESTEROL: 258 mg/dL — AB (ref 0–200)
HDL: 71.1 mg/dL (ref >39.00)
LDL CALC: 158 mg/dL — AB (ref 0–99)
NonHDL: 186.84
TRIGLYCERIDES: 143 mg/dL (ref 0.0–149.0)
Total CHOL/HDL Ratio: 4
VLDL: 28.6 mg/dL (ref 0.0–40.0)

## 2016-06-23 LAB — SEDIMENTATION RATE: SED RATE: 12 mm/h (ref 0–30)

## 2016-06-23 LAB — TSH: TSH: 1.66 u[IU]/mL (ref 0.35–4.50)

## 2016-06-23 MED ORDER — ZOLPIDEM TARTRATE 5 MG PO TABS: 5.0000 mg | ORAL_TABLET | Freq: Every evening | ORAL | 1 refills | Status: DC | PRN

## 2016-06-23 MED ORDER — PREDNISONE 5 MG PO TABS: 2.5000 mg | ORAL_TABLET | Freq: Every day | ORAL | 1 refills | Status: DC

## 2016-06-23 NOTE — Progress Notes (Signed)
Subjective:    Patient ID: Savannah Jimenez, female    DOB: 04/15/1936, 80 y.o.   MRN: [id number]  HPI  80 year old patient who is seen today for a preventive health examination. She has a history of osteoporosis.  She has been treated with biphosphonate's in the past and presently is on calcium and vitamin D supplements.  Doing quite well She has a history of PMR and presently is on 1.125 milligrams of prednisone daily.  No concerns or complaints.  Past Medical History:  Diagnosis Date  . Allergy   . Arthritis   . GERD (gastroesophageal reflux disease)   . HOH (hard of hearing)   . Osteoporosis      Social History   Social History  . Marital status: Married    Spouse name: N/A  . Number of children: N/A  . Years of education: N/A   Occupational History  . Not on file.   Social History Main Topics  . Smoking status: Never Smoker  . Smokeless tobacco: Never Used  . Alcohol use Yes     Comment: rarely  . Drug use: No  . Sexual activity: Not on file   Other Topics Concern  . Not on file   Social History Narrative  . No narrative on file    Past Surgical History:  Procedure Laterality Date  . ABDOMINAL HYSTERECTOMY    . ARTERY BIOPSY Right 10/06/2014   Procedure: BIOPSY TEMPORAL ARTERY;  Surgeon: Armandina Gemma, MD;  Location: [facility];  Service: General;  Laterality: Right;  . CATARACT EXTRACTION  10/2011   Left  . colonscopy  2007  . INNER EAR SURGERY  1974   both stapes replaced-steal  . TUBAL LIGATION      Family History  Problem Relation Age of Onset  . Heart failure Mother   . Hypertension Sister   . Asthma Sister   . Hypertension Brother     Allergies  Allergen Reactions  . Tetanus Toxoids     Arm swelling   . Penicillins   . Sulfacetamide Sodium     Current Outpatient Prescriptions on File Prior to Visit  Medication Sig Dispense Refill  . fish oil-omega-3 fatty acids 1000 MG capsule Take 2 g by mouth daily.      . fluticasone  (FLONASE) 50 MCG/ACT nasal spray Place 2 sprays into both nostrils daily. 16 g 6  . Melatonin 3 MG TABS Take 1 tablet by mouth at bedtime.    . Multiple Vitamins-Minerals (PRESERVISION AREDS 2 PO) Take 1 tablet by mouth 3 (three) times a week.     . predniSONE (DELTASONE) 5 MG tablet Take 0.5 tablets (2.5 mg total) by mouth daily with breakfast. 90 tablet 1   No current facility-administered medications on file prior to visit.     BP 138/70 (BP Location: Left Arm, Patient Position: Sitting, Cuff Size: Normal)   Pulse 73   Temp 98.3 F (36.8 C) (Oral)   Resp 18   Ht 5' 2.5" (1.588 m)   Wt 120 lb 8 oz (54.7 kg)   SpO2 97%   BMI 21.69 kg/m    Medicare wellness  1. Risk factors, based on past  M,S,F history.  Patient has no cardiovascular risk factors except age  16.  Physical activities:remains quite active.  Does do light weight training and cardio workouts 3 times per week  3.  Depression/mood:no history of major depression or mood disorder  4.  Hearing: uses hearing aids  5.  ADL's:independent in all aspects of daily living  6.  Fall risk:low  7.  Home safety:no problems identified  8.  Height weight, and visual acuity;height and weight stable is followed closely by ophthalmology including a retinal specialist  9.  Counseling:continue heart healthy diet regular exercise program.  Patient is at ideal body weight  10. Lab orders based on risk factors:we'll check laboratory studies including lipid profile  11. Referral :follow-up ophthalmology  12. Care plan:ontinue calcium and vitamin D supplements and active lifestyle  13. Cognitive assessment: alert in order with normal affect no cognitive dysfunction 14. Screening: Patient provided with a written and personalized 5-10 year screening schedule in the AVS.    15. Provider List Update: primary care medicine ophthalmology, dermatology and dental medicine    Review of Systems  Constitutional: Negative.   HENT:  Negative for congestion, dental problem, hearing loss, rhinorrhea, sinus pressure, sore throat and tinnitus.   Eyes: Negative for pain, discharge and visual disturbance.  Respiratory: Negative for cough and shortness of breath.   Cardiovascular: Negative for chest pain, palpitations and leg swelling.  Gastrointestinal: Negative for abdominal distention, abdominal pain, blood in stool, constipation, diarrhea, nausea and vomiting.  Genitourinary: Negative for difficulty urinating, dysuria, flank pain, frequency, hematuria, pelvic pain, urgency, vaginal bleeding, vaginal discharge and vaginal pain.  Musculoskeletal: Negative for arthralgias, gait problem and joint swelling.  Skin: Negative for rash.  Neurological: Negative for dizziness, syncope, speech difficulty, weakness, numbness and headaches.  Hematological: Negative for adenopathy.  Psychiatric/Behavioral: Negative for agitation, behavioral problems and dysphoric mood. The patient is not nervous/anxious.        Objective:   Physical Exam  Constitutional: She is oriented to person, place, and time. She appears well-developed and well-nourished.  HENT:  Head: Normocephalic and atraumatic.  Right Ear: External ear normal.  Left Ear: External ear normal.  Mouth/Throat: Oropharynx is clear and moist.  Eyes: Conjunctivae and EOM are normal.  Neck: Normal range of motion. Neck supple. No JVD present. No thyromegaly present.  Cardiovascular: Normal rate, regular rhythm, normal heart sounds and intact distal pulses.   No murmur heard. Pulmonary/Chest: Effort normal and breath sounds normal. She has no wheezes. She has no rales.  Abdominal: Soft. Bowel sounds are normal. She exhibits no distension and no mass. There is no tenderness. There is no rebound and no guarding.  Musculoskeletal: Normal range of motion. She exhibits no edema or tenderness.  Neurological: She is alert and oriented to person, place, and time. She has normal reflexes. No  cranial nerve deficit. She exhibits normal muscle tone. Coordination normal.  Skin: Skin is warm and dry. No rash noted.  Slight scarring right axilla  Psychiatric: She has a normal mood and affect. Her behavior is normal.          Assessment & Plan:   Osteoporosis.  We'll continue calcium and vitamin D supplementation.  We'll check a bone density study at the time of her next mammogram in the spring Medicare wellness visit Allergic rhinitis, stable PMR.  In 4 weeks.  Will decrease prednisone further to 1.125 milligrams every other day for 6 weeks and then discontinue.  If stable  We'll review laboratory studies  Recheck one year or as needed  [employer]

## 2016-06-23 NOTE — Patient Instructions (Addendum)
Take a calcium supplement, plus [phone] units of vitamin D    It is important that you exercise regularly, at least 20 minutes 3 to 4 times per week.  If you develop chest pain or shortness of breath seek  medical attention.    It is important that you exercise regularly, at least 20 minutes 3 to 4 times per week.  If you develop chest pain or shortness of breath seek  medical attention.   Health Maintenance for Postmenopausal Women Introduction Menopause is a normal process in which your reproductive ability comes to an end. This process happens gradually over a span of months to years, usually between the ages of 53 and 42. Menopause is complete when you have missed 12 consecutive menstrual periods. It is important to talk with your health care provider about some of the most common conditions that affect postmenopausal women, such as heart disease, cancer, and bone loss (osteoporosis). Adopting a healthy lifestyle and getting preventive care can help to promote your health and wellness. Those actions can also lower your chances of developing some of these common conditions. What should I know about menopause? During menopause, you may experience a number of symptoms, such as:  Moderate-to-severe hot flashes.  Night sweats.  Decrease in sex drive.  Mood swings.  Headaches.  Tiredness.  Irritability.  Memory problems.  Insomnia. Choosing to treat or not to treat menopausal changes is an individual decision that you make with your health care provider. What should I know about hormone replacement therapy and supplements? Hormone therapy products are effective for treating symptoms that are associated with menopause, such as hot flashes and night sweats. Hormone replacement carries certain risks, especially as you become older. If you are thinking about using estrogen or estrogen with progestin treatments, discuss the benefits and risks with your health care provider. What should I  know about heart disease and stroke? Heart disease, heart attack, and stroke become more likely as you age. This may be due, in part, to the hormonal changes that your body experiences during menopause. These can affect how your body processes dietary fats, triglycerides, and cholesterol. Heart attack and stroke are both medical emergencies. There are many things that you can do to help prevent heart disease and stroke:  Have your blood pressure checked at least every 1-2 years. High blood pressure causes heart disease and increases the risk of stroke.  If you are 59-13 years old, ask your health care provider if you should take aspirin to prevent a heart attack or a stroke.  Do not use any tobacco products, including cigarettes, chewing tobacco, or electronic cigarettes. If you need help quitting, ask your health care provider.  It is important to eat a healthy diet and maintain a healthy weight.  Be sure to include plenty of vegetables, fruits, low-fat dairy products, and lean protein.  Avoid eating foods that are high in solid fats, added sugars, or salt (sodium).  Get regular exercise. This is one of the most important things that you can do for your health.  Try to exercise for at least 150 minutes each week. The type of exercise that you do should increase your heart rate and make you sweat. This is known as moderate-intensity exercise.  Try to do strengthening exercises at least twice each week. Do these in addition to the moderate-intensity exercise.  Know your numbers.Ask your health care provider to check your cholesterol and your blood glucose. Continue to have your blood tested as directed  by your health care provider. What should I know about cancer screening? There are several types of cancer. Take the following steps to reduce your risk and to catch any cancer development as early as possible. Breast Cancer  Practice breast self-awareness.  This means understanding how  your breasts normally appear and feel.  It also means doing regular breast self-exams. Let your health care provider know about any changes, no matter how small.  If you are 64 or older, have a clinician do a breast exam (clinical breast exam or CBE) every year. Depending on your age, family history, and medical history, it may be recommended that you also have a yearly breast X-ray (mammogram).  If you have a family history of breast cancer, talk with your health care provider about genetic screening.  If you are at high risk for breast cancer, talk with your health care provider about having an MRI and a mammogram every year.  Breast cancer (BRCA) gene test is recommended for women who have family members with BRCA-related cancers. Results of the assessment will determine the need for genetic counseling and BRCA1 and for BRCA2 testing. BRCA-related cancers include these types:  Breast. This occurs in males or females.  Ovarian.  Tubal. This may also be called fallopian tube cancer.  Cancer of the abdominal or pelvic lining (peritoneal cancer).  Prostate.  Pancreatic. Cervical, Uterine, and Ovarian Cancer  Your health care provider may recommend that you be screened regularly for cancer of the pelvic organs. These include your ovaries, uterus, and vagina. This screening involves a pelvic exam, which includes checking for microscopic changes to the surface of your cervix (Pap test).  For women ages 21-65, health care providers may recommend a pelvic exam and a Pap test every three years. For women ages 52-65, they may recommend the Pap test and pelvic exam, combined with testing for human papilloma virus (HPV), every five years. Some types of HPV increase your risk of cervical cancer. Testing for HPV may also be done on women of any age who have unclear Pap test results.  Other health care providers may not recommend any screening for nonpregnant women who are considered low risk for  pelvic cancer and have no symptoms. Ask your health care provider if a screening pelvic exam is right for you.  If you have had past treatment for cervical cancer or a condition that could lead to cancer, you need Pap tests and screening for cancer for at least 20 years after your treatment. If Pap tests have been discontinued for you, your risk factors (such as having a new sexual partner) need to be reassessed to determine if you should start having screenings again. Some women have medical problems that increase the chance of getting cervical cancer. In these cases, your health care provider may recommend that you have screening and Pap tests more often.  If you have a family history of uterine cancer or ovarian cancer, talk with your health care provider about genetic screening.  If you have vaginal bleeding after reaching menopause, tell your health care provider.  There are currently no reliable tests available to screen for ovarian cancer. Lung Cancer  Lung cancer screening is recommended for adults 85-58 years old who are at high risk for lung cancer because of a history of smoking. A yearly low-dose CT scan of the lungs is recommended if you:  Currently smoke.  Have a history of at least 30 pack-years of smoking and you currently smoke or  have quit within the past 15 years. A pack-year is smoking an average of one pack of cigarettes per day for one year. Yearly screening should:  Continue until it has been 15 years since you quit.  Stop if you develop a health problem that would prevent you from having lung cancer treatment. Colorectal Cancer  This type of cancer can be detected and can often be prevented.  Routine colorectal cancer screening usually begins at age 74 and continues through age 76.  If you have risk factors for colon cancer, your health care provider may recommend that you be screened at an earlier age.  If you have a family history of colorectal cancer, talk with  your health care provider about genetic screening.  Your health care provider may also recommend using home test kits to check for hidden blood in your stool.  A small camera at the end of a tube can be used to examine your colon directly (sigmoidoscopy or colonoscopy). This is done to check for the earliest forms of colorectal cancer.  Direct examination of the colon should be repeated every 5-10 years until age 71. However, if early forms of precancerous polyps or small growths are found or if you have a family history or genetic risk for colorectal cancer, you may need to be screened more often. Skin Cancer  Check your skin from head to toe regularly.  Monitor any moles. Be sure to tell your health care provider:  About any new moles or changes in moles, especially if there is a change in a mole's shape or color.  If you have a mole that is larger than the size of a pencil eraser.  If any of your family members has a history of skin cancer, especially at a young age, talk with your health care provider about genetic screening.  Always use sunscreen. Apply sunscreen liberally and repeatedly throughout the day.  Whenever you are outside, protect yourself by wearing long sleeves, pants, a wide-brimmed hat, and sunglasses. What should I know about osteoporosis? Osteoporosis is a condition in which bone destruction happens more quickly than new bone creation. After menopause, you may be at an increased risk for osteoporosis. To help prevent osteoporosis or the bone fractures that can happen because of osteoporosis, the following is recommended:  If you are 65-74 years old, get at least 1,000 mg of calcium and at least 600 mg of vitamin D per day.  If you are older than age 71 but younger than age 55, get at least 1,200 mg of calcium and at least 600 mg of vitamin D per day.  If you are older than age 49, get at least 1,200 mg of calcium and at least 800 mg of vitamin D per day. Smoking  and excessive alcohol intake increase the risk of osteoporosis. Eat foods that are rich in calcium and vitamin D, and do weight-bearing exercises several times each week as directed by your health care provider. What should I know about how menopause affects my mental health? Depression may occur at any age, but it is more common as you become older. Common symptoms of depression include:  Low or sad mood.  Changes in sleep patterns.  Changes in appetite or eating patterns.  Feeling an overall lack of motivation or enjoyment of activities that you previously enjoyed.  Frequent crying spells. Talk with your health care provider if you think that you are experiencing depression. What should I know about immunizations? It is important that  you get and maintain your immunizations. These include:  Tetanus, diphtheria, and pertussis (Tdap) booster vaccine.  Influenza every year before the flu season begins.  Pneumonia vaccine.  Shingles vaccine. Your health care provider may also recommend other immunizations. This information is not intended to replace advice given to you by your health care provider. Make sure you discuss any questions you have with your health care provider. Document Released: 09/01/2005 Document Revised: 01/28/2016 Document Reviewed: 04/13/2015  2017 [employer]

## 2016-06-29 DIAGNOSIS — H04123 Dry eye syndrome of bilateral lacrimal glands: Secondary | ICD-10-CM | POA: Diagnosis not present

## 2016-06-29 DIAGNOSIS — H40013 Open angle with borderline findings, low risk, bilateral: Secondary | ICD-10-CM | POA: Diagnosis not present

## 2016-08-03 ENCOUNTER — Telehealth: Payer: Self-pay | Admitting: Internal Medicine

## 2016-08-03 ENCOUNTER — Other Ambulatory Visit: Payer: Self-pay | Admitting: Internal Medicine

## 2016-08-03 DIAGNOSIS — M810 Age-related osteoporosis without current pathological fracture: Secondary | ICD-10-CM

## 2016-08-03 NOTE — Telephone Encounter (Signed)
Referral order was put in and pt was called and notified. Pt verbalized understanding.

## 2016-08-03 NOTE — Telephone Encounter (Signed)
° ° ° °  Pt said [facility] will be [facility] and they need an order sent to them

## 2016-08-08 ENCOUNTER — Other Ambulatory Visit: Payer: Self-pay | Admitting: Internal Medicine

## 2016-08-10 DIAGNOSIS — J209 Acute bronchitis, unspecified: Secondary | ICD-10-CM | POA: Diagnosis not present

## 2016-08-28 ENCOUNTER — Ambulatory Visit (INDEPENDENT_AMBULATORY_CARE_PROVIDER_SITE_OTHER): Payer: Medicare Other | Admitting: Internal Medicine

## 2016-08-28 ENCOUNTER — Encounter: Payer: Self-pay | Admitting: Internal Medicine

## 2016-08-28 VITALS — BP 126/70 | HR 86 | Temp 98.1°F | Ht 62.5 in | Wt 114.4 lb

## 2016-08-28 DIAGNOSIS — R519 Headache, unspecified: Secondary | ICD-10-CM

## 2016-08-28 DIAGNOSIS — R51 Headache: Secondary | ICD-10-CM

## 2016-08-28 DIAGNOSIS — M353 Polymyalgia rheumatica: Secondary | ICD-10-CM | POA: Diagnosis not present

## 2016-08-28 LAB — SEDIMENTATION RATE: SED RATE: 14 mm/h (ref 0–30)

## 2016-08-28 MED ORDER — MELOXICAM 7.5 MG PO TABS: 7.5000 mg | ORAL_TABLET | Freq: Every day | ORAL | 0 refills | Status: DC

## 2016-08-28 NOTE — Progress Notes (Signed)
Subjective:    Patient ID: Savannah Jimenez, female    DOB: October 26, 1935, 81 y.o.   MRN: [id number]  HPI  81 year old patient who has a history of PMR. In March 2016 she underwent a right temporal artery biopsy due to right temporal headaches in the setting of an elevated ESR. This biopsy was negative. The patient's PMR has been stable at the time of her last visit.  The plan was to taper and discontinue steroids.  She was treated recently for a sinus infection at an urgent care and received azithromycin.  Due to the acute illness.  The patient has not tapered and discontinued prednisone.  She alternates 1.25 milligrams or prednisone with 2.5 milligrams of prednisone every morning. For the past 2 days she has had recurrent pain in the right temporal area.  This morning she was awakened with the sharp, fleeting pain, but in general, she has periods of low-grade throbbing discomfort that just lasts seconds. She is recovering from the sinus infection with some residual cough.  She has had some recent anorexia and weight loss but in general.  No long-term constitutional complaints Denies any difficulty swallowing, visual disturbances, no pain with chewing  Past Medical History:  Diagnosis Date  . Allergy   . Arthritis   . GERD (gastroesophageal reflux disease)   . HOH (hard of hearing)   . Osteoporosis      Social History   Social History  . Marital status: Married    Spouse name: N/A  . Number of children: N/A  . Years of education: N/A   Occupational History  . Not on file.   Social History Main Topics  . Smoking status: Never Smoker  . Smokeless tobacco: Never Used  . Alcohol use Yes     Comment: rarely  . Drug use: No  . Sexual activity: Not on file   Other Topics Concern  . Not on file   Social History Narrative  . No narrative on file    Past Surgical History:  Procedure Laterality Date  . ABDOMINAL HYSTERECTOMY    . ARTERY BIOPSY Right 10/06/2014   Procedure:  BIOPSY TEMPORAL ARTERY;  Surgeon: Armandina Gemma, MD;  Location: [facility];  Service: General;  Laterality: Right;  . CATARACT EXTRACTION  10/2011   Left  . colonscopy  2007  . INNER EAR SURGERY  1974   both stapes replaced-steal  . TUBAL LIGATION      Family History  Problem Relation Age of Onset  . Heart failure Mother   . Hypertension Sister   . Asthma Sister   . Hypertension Brother     Allergies  Allergen Reactions  . Tetanus Toxoids     Arm swelling   . Penicillins   . Sulfacetamide Sodium     Current Outpatient Prescriptions on File Prior to Visit  Medication Sig Dispense Refill  . fish oil-omega-3 fatty acids 1000 MG capsule Take 2 g by mouth daily.      . fluticasone (FLONASE) 50 MCG/ACT nasal spray SHAKE WELL AND USE 2 SPRAYS IN EACH NOSTRIL DAILY 16 g 3  . Melatonin 3 MG TABS Take 1 tablet by mouth at bedtime.    . Multiple Vitamins-Minerals (PRESERVISION AREDS 2 PO) Take 1 tablet by mouth 3 (three) times a week.     . predniSONE (DELTASONE) 5 MG tablet Take 0.5 tablets (2.5 mg total) by mouth daily with breakfast. 90 tablet 1  . zolpidem (AMBIEN) 5 MG tablet Take  1 tablet (5 mg total) by mouth at bedtime as needed. 90 tablet 1   No current facility-administered medications on file prior to visit.     BP 126/70 (BP Location: Left Arm, Patient Position: Sitting, Cuff Size: Large)   Pulse 86   Temp 98.1 F (36.7 C) (Oral)   Ht 5' 2.5" (1.588 m)   Wt 114 lb 6.4 oz (51.9 kg)   SpO2 95%   BMI 20.59 kg/m     Review of Systems  Constitutional: Negative.   HENT: Negative for congestion, dental problem, hearing loss, rhinorrhea, sinus pressure, sore throat and tinnitus.   Eyes: Negative for pain, discharge and visual disturbance.  Respiratory: Positive for cough. Negative for shortness of breath.   Cardiovascular: Negative for chest pain, palpitations and leg swelling.  Gastrointestinal: Negative for abdominal distention, abdominal pain, blood in  stool, constipation, diarrhea, nausea and vomiting.  Genitourinary: Negative for difficulty urinating, dysuria, flank pain, frequency, hematuria, pelvic pain, urgency, vaginal bleeding, vaginal discharge and vaginal pain.  Musculoskeletal: Negative for arthralgias, gait problem and joint swelling.  Skin: Negative for rash.  Neurological: Positive for headaches. Negative for dizziness, syncope, speech difficulty, weakness and numbness.  Hematological: Negative for adenopathy.  Psychiatric/Behavioral: Negative for agitation, behavioral problems and dysphoric mood. The patient is not nervous/anxious.        Objective:   Physical Exam  Constitutional: She is oriented to person, place, and time. She appears well-developed and well-nourished.  HENT:  Head: Normocephalic.  Right Ear: External ear normal.  Left Ear: External ear normal.  Mouth/Throat: Oropharynx is clear and moist.  No active mouth is in the right temporal scalp area  Eyes: Conjunctivae and EOM are normal. Pupils are equal, round, and reactive to light.  Neck: Normal range of motion. Neck supple. No thyromegaly present.  Cardiovascular: Normal rate, regular rhythm, normal heart sounds and intact distal pulses.   Pulmonary/Chest: Effort normal and breath sounds normal.  Abdominal: Soft. Bowel sounds are normal. She exhibits no mass. There is no tenderness.  Musculoskeletal: Normal range of motion.  Lymphadenopathy:    She has no cervical adenopathy.  Neurological: She is alert and oriented to person, place, and time.  Skin: Skin is warm and dry. No rash noted.  Psychiatric: She has a normal mood and affect. Her behavior is normal.          Assessment & Plan:   Right temporal scalp pain History of PMR.  Will check sedimentation rate  Will treat with meloxicam, warm compresses and observe.  Certainly nothing to suggest [facility]

## 2016-08-28 NOTE — Progress Notes (Signed)
Pre visit review using our clinic review tool, if applicable. No additional management support is needed unless otherwise documented below in the visit note. 

## 2016-08-28 NOTE — Patient Instructions (Signed)
Take medication as prescribed  Warm compresses to the right temporal scalp area as needed  Call for any new or worsening symptoms

## 2016-09-22 ENCOUNTER — Ambulatory Visit: Payer: Medicare Other | Admitting: Internal Medicine

## 2016-10-26 ENCOUNTER — Ambulatory Visit
Admission: RE | Admit: 2016-10-26 | Discharge: 2016-10-26 | Disposition: A | Discharge status: Home / Self Care | Payer: Medicare Other | Source: Ambulatory Visit | Attending: Internal Medicine | Admitting: Internal Medicine

## 2016-10-26 DIAGNOSIS — Z1231 Encounter for screening mammogram for malignant neoplasm of breast: Secondary | ICD-10-CM | POA: Diagnosis not present

## 2016-11-16 ENCOUNTER — Ambulatory Visit (INDEPENDENT_AMBULATORY_CARE_PROVIDER_SITE_OTHER): Payer: Medicare Other | Admitting: Ophthalmology

## 2016-11-16 DIAGNOSIS — H43813 Vitreous degeneration, bilateral: Secondary | ICD-10-CM | POA: Diagnosis not present

## 2016-11-16 DIAGNOSIS — H43823 Vitreomacular adhesion, bilateral: Secondary | ICD-10-CM | POA: Diagnosis not present

## 2016-11-22 ENCOUNTER — Ambulatory Visit (INDEPENDENT_AMBULATORY_CARE_PROVIDER_SITE_OTHER): Payer: Medicare Other | Admitting: Internal Medicine

## 2016-11-22 ENCOUNTER — Encounter: Payer: Self-pay | Admitting: Internal Medicine

## 2016-11-22 VITALS — BP 122/64 | HR 77 | Temp 98.0°F | Ht 62.5 in | Wt 116.0 lb

## 2016-11-22 DIAGNOSIS — M818 Other osteoporosis without current pathological fracture: Secondary | ICD-10-CM | POA: Diagnosis not present

## 2016-11-22 DIAGNOSIS — J301 Allergic rhinitis due to pollen: Secondary | ICD-10-CM

## 2016-11-22 DIAGNOSIS — M353 Polymyalgia rheumatica: Secondary | ICD-10-CM | POA: Diagnosis not present

## 2016-11-22 LAB — SEDIMENTATION RATE: SED RATE: 10 mm/h (ref 0–30)

## 2016-11-22 NOTE — Progress Notes (Signed)
Pre visit review using our clinic review tool, if applicable. No additional management support is needed unless otherwise documented below in the visit note. 

## 2016-11-22 NOTE — Progress Notes (Signed)
Subjective:    Patient ID: Savannah Jimenez, female    DOB: 07/17/1936, 81 y.o.   MRN: [id number]  HPI 81 year old patient who has a history of PMR.  She's been on prednisone for about 7 years.  Prednisone has been tapered and finally discontinued about one month ago.  She feels well. No new concerns or complaints  Past Medical History:  Diagnosis Date  . Allergy   . Arthritis   . GERD (gastroesophageal reflux disease)   . HOH (hard of hearing)   . Osteoporosis      Social History   Social History  . Marital status: Married    Spouse name: N/A  . Number of children: N/A  . Years of education: N/A   Occupational History  . Not on file.   Social History Main Topics  . Smoking status: Never Smoker  . Smokeless tobacco: Never Used  . Alcohol use Yes     Comment: rarely  . Drug use: No  . Sexual activity: Not on file   Other Topics Concern  . Not on file   Social History Narrative  . No narrative on file    Past Surgical History:  Procedure Laterality Date  . ABDOMINAL HYSTERECTOMY    . ARTERY BIOPSY Right 10/06/2014   Procedure: BIOPSY TEMPORAL ARTERY;  Surgeon: Armandina Gemma, MD;  Location: [facility];  Service: General;  Laterality: Right;  . CATARACT EXTRACTION  10/2011   Left  . colonscopy  2007  . INNER EAR SURGERY  1974   both stapes replaced-steal  . TUBAL LIGATION      Family History  Problem Relation Age of Onset  . Heart failure Mother   . Hypertension Sister   . Asthma Sister   . Hypertension Brother     Allergies  Allergen Reactions  . Tetanus Toxoids     Arm swelling   . Penicillins   . Sulfacetamide Sodium     Current Outpatient Prescriptions on File Prior to Visit  Medication Sig Dispense Refill  . calcium citrate-vitamin D (CITRACAL+D) 315-200 MG-UNIT tablet Take 1 tablet by mouth daily.    . fish oil-omega-3 fatty acids 1000 MG capsule Take 2 g by mouth daily.      . fluticasone (FLONASE) 50 MCG/ACT nasal spray SHAKE  WELL AND USE 2 SPRAYS IN EACH NOSTRIL DAILY 16 g 3  . Melatonin 3 MG TABS Take 1 tablet by mouth at bedtime.    . meloxicam (MOBIC) 7.5 MG tablet Take 1 tablet (7.5 mg total) by mouth daily. 30 tablet 0  . Multiple Vitamins-Minerals (PRESERVISION AREDS 2 PO) Take 1 tablet by mouth 3 (three) times a week.     . Multiple Vitamins-Minerals (VISION-VITE PRESERVE PO) Take 1 tablet by mouth 2 (two) times daily.    Marland Kitchen zolpidem (AMBIEN) 5 MG tablet Take 1 tablet (5 mg total) by mouth at bedtime as needed. 90 tablet 1  . predniSONE (DELTASONE) 5 MG tablet Take 0.5 tablets (2.5 mg total) by mouth daily with breakfast. (Patient not taking: Reported on 11/22/2016) 90 tablet 1   No current facility-administered medications on file prior to visit.     BP 122/64 (BP Location: Left Arm, Patient Position: Sitting, Cuff Size: Normal)   Pulse 77   Temp 98 F (36.7 C) (Oral)   Ht 5' 2.5" (1.588 m)   Wt 116 lb (52.6 kg)   SpO2 97%   BMI 20.88 kg/m      Review of  Systems  Constitutional: Negative.   HENT: Negative for congestion, dental problem, hearing loss, rhinorrhea, sinus pressure, sore throat and tinnitus.   Eyes: Negative for pain, discharge and visual disturbance.  Respiratory: Negative for cough and shortness of breath.   Cardiovascular: Negative for chest pain, palpitations and leg swelling.  Gastrointestinal: Negative for abdominal distention, abdominal pain, blood in stool, constipation, diarrhea, nausea and vomiting.  Genitourinary: Negative for difficulty urinating, dysuria, flank pain, frequency, hematuria, pelvic pain, urgency, vaginal bleeding, vaginal discharge and vaginal pain.  Musculoskeletal: Positive for neck pain and neck stiffness. Negative for arthralgias, gait problem and joint swelling.  Skin: Negative for rash.  Neurological: Negative for dizziness, syncope, speech difficulty, weakness, numbness and headaches.  Hematological: Negative for adenopathy.  Psychiatric/Behavioral:  Negative for agitation, behavioral problems and dysphoric mood. The patient is not nervous/anxious.        Objective:   Physical Exam  Constitutional: She is oriented to person, place, and time. She appears well-developed and well-nourished.  HENT:  Head: Normocephalic.  Right Ear: External ear normal.  Left Ear: External ear normal.  Mouth/Throat: Oropharynx is clear and moist.  Eyes: Conjunctivae and EOM are normal. Pupils are equal, round, and reactive to light.  Neck: Normal range of motion. Neck supple. No thyromegaly present.  Cardiovascular: Normal rate, regular rhythm, normal heart sounds and intact distal pulses.   Pulmonary/Chest: Effort normal and breath sounds normal.  Abdominal: Soft. Bowel sounds are normal. She exhibits no mass. There is no tenderness.  Musculoskeletal: Normal range of motion.  Lymphadenopathy:    She has no cervical adenopathy.  Neurological: She is alert and oriented to person, place, and time.  Skin: Skin is warm and dry. No rash noted.  Psychiatric: She has a normal mood and affect. Her behavior is normal.          Assessment & Plan:   PMR.  Appears to be stable off prednisone.  We'll review ESR History of osteoporosis  Patient is scheduled for Medicare wellness visit soon.  Will schedule annual exam  Nyoka Cowden

## 2016-11-22 NOTE — Patient Instructions (Signed)
Take a calcium supplement, plus [phone] units of vitamin D    It is important that you exercise regularly, at least 20 minutes 3 to 4 times per week.  If you develop chest pain or shortness of breath seek  medical attention.

## 2016-11-27 ENCOUNTER — Ambulatory Visit: Payer: Medicare Other | Admitting: Internal Medicine

## 2017-01-04 DIAGNOSIS — H26491 Other secondary cataract, right eye: Secondary | ICD-10-CM | POA: Diagnosis not present

## 2017-01-04 DIAGNOSIS — H353132 Nonexudative age-related macular degeneration, bilateral, intermediate dry stage: Secondary | ICD-10-CM | POA: Diagnosis not present

## 2017-01-04 DIAGNOSIS — H43823 Vitreomacular adhesion, bilateral: Secondary | ICD-10-CM | POA: Diagnosis not present

## 2017-01-04 DIAGNOSIS — H40013 Open angle with borderline findings, low risk, bilateral: Secondary | ICD-10-CM | POA: Diagnosis not present

## 2017-01-11 DIAGNOSIS — M25571 Pain in right ankle and joints of right foot: Secondary | ICD-10-CM | POA: Diagnosis not present

## 2017-01-11 DIAGNOSIS — M25572 Pain in left ankle and joints of left foot: Secondary | ICD-10-CM | POA: Diagnosis not present

## 2017-01-11 DIAGNOSIS — M7672 Peroneal tendinitis, left leg: Secondary | ICD-10-CM | POA: Diagnosis not present

## 2017-01-11 DIAGNOSIS — M7671 Peroneal tendinitis, right leg: Secondary | ICD-10-CM | POA: Diagnosis not present

## 2017-01-12 ENCOUNTER — Ambulatory Visit: Payer: Medicare Other | Admitting: Internal Medicine

## 2017-02-19 ENCOUNTER — Encounter: Payer: Self-pay | Admitting: Internal Medicine

## 2017-02-19 ENCOUNTER — Ambulatory Visit (INDEPENDENT_AMBULATORY_CARE_PROVIDER_SITE_OTHER): Payer: Medicare Other | Admitting: Internal Medicine

## 2017-02-19 VITALS — BP 124/60 | HR 94 | Temp 97.8°F | Ht 62.5 in | Wt 116.0 lb

## 2017-02-19 DIAGNOSIS — M25572 Pain in left ankle and joints of left foot: Secondary | ICD-10-CM

## 2017-02-19 DIAGNOSIS — M353 Polymyalgia rheumatica: Secondary | ICD-10-CM | POA: Diagnosis not present

## 2017-02-19 LAB — SEDIMENTATION RATE: SED RATE: 14 mm/h (ref 0–30)

## 2017-02-19 MED ORDER — MELOXICAM 7.5 MG PO TABS: 7.5000 mg | ORAL_TABLET | Freq: Every day | ORAL | 3 refills | Status: DC

## 2017-02-19 NOTE — Patient Instructions (Addendum)
WE NOW OFFER   Savannah Jimenez FAST TRACK!!!  SAME DAY Appointments for ACUTE CARE  Such as: Sprains, Injuries, cuts, abrasions, rashes, muscle pain, joint pain, back pain Colds, flu, sore throats, headache, allergies, cough, fever  Ear pain, sinus and eye infections Abdominal pain, nausea, vomiting, diarrhea, upset stomach Animal/insect bites  3 Easy Ways to Schedule: Walk-In Scheduling Call in scheduling Mychart Sign-up: [email or web address]  Meloxicam 1 tablet daily  Call if you develop worsening symptoms

## 2017-02-19 NOTE — Progress Notes (Signed)
Subjective:    Patient ID: Savannah Jimenez, female    DOB: 26-Feb-1936, 81 y.o.   MRN: [id number]  HPI  81 year old patient who has a history of PMR who has been off prednisone for several months  For the past 2 months or so she has had pain, erythema and tenderness involving the left ankle area.  She was seen by orthopedics.  Approximately 2 months ago and radiographs of both ankles were normal. Pain and erythema seems localized medially but prior was also involving the lateral aspect of the ankle.  More recently, she also describes some muscle aches involving the shoulder areas.  She has a difficult time raising her hands above her  Shoulders.  Past Medical History:  Diagnosis Date  . Allergy   . Arthritis   . GERD (gastroesophageal reflux disease)   . HOH (hard of hearing)   . Osteoporosis      Social History   Social History  . Marital status: Married    Spouse name: N/A  . Number of children: N/A  . Years of education: N/A   Occupational History  . Not on file.   Social History Main Topics  . Smoking status: Never Smoker  . Smokeless tobacco: Never Used  . Alcohol use Yes     Comment: rarely  . Drug use: No  . Sexual activity: Not on file   Other Topics Concern  . Not on file   Social History Narrative  . No narrative on file    Past Surgical History:  Procedure Laterality Date  . ABDOMINAL HYSTERECTOMY    . ARTERY BIOPSY Right 10/06/2014   Procedure: BIOPSY TEMPORAL ARTERY;  Surgeon: Armandina Gemma, MD;  Location: [facility];  Service: General;  Laterality: Right;  . CATARACT EXTRACTION  10/2011   Left  . colonscopy  2007  . INNER EAR SURGERY  1974   both stapes replaced-steal  . TUBAL LIGATION      Family History  Problem Relation Age of Onset  . Heart failure Mother   . Hypertension Sister   . Asthma Sister   . Hypertension Brother     Allergies  Allergen Reactions  . Tetanus Toxoids     Arm swelling   . Penicillins   .  Sulfacetamide Sodium     Current Outpatient Prescriptions on File Prior to Visit  Medication Sig Dispense Refill  . calcium citrate-vitamin D (CITRACAL+D) 315-200 MG-UNIT tablet Take 1 tablet by mouth daily.    . fish oil-omega-3 fatty acids 1000 MG capsule Take 2 g by mouth daily.      . fluticasone (FLONASE) 50 MCG/ACT nasal spray SHAKE WELL AND USE 2 SPRAYS IN EACH NOSTRIL DAILY 16 g 3  . Melatonin 3 MG TABS Take 1 tablet by mouth at bedtime.    . meloxicam (MOBIC) 7.5 MG tablet Take 1 tablet (7.5 mg total) by mouth daily. 30 tablet 0  . Multiple Vitamins-Minerals (PRESERVISION AREDS 2 PO) Take 1 tablet by mouth 3 (three) times a week.     . Multiple Vitamins-Minerals (VISION-VITE PRESERVE PO) Take 1 tablet by mouth 2 (two) times daily.    Marland Kitchen zolpidem (AMBIEN) 5 MG tablet Take 1 tablet (5 mg total) by mouth at bedtime as needed. 90 tablet 1   No current facility-administered medications on file prior to visit.     BP 124/60 (BP Location: Left Arm, Patient Position: Sitting, Cuff Size: Normal)   Pulse 94   Temp 97.8 F (  36.6 C) (Oral)   Ht 5' 2.5" (1.588 m)   Wt 116 lb (52.6 kg)   SpO2 96%   BMI 20.88 kg/m     Review of Systems  Constitutional: Negative.   HENT: Negative for congestion, dental problem, hearing loss, rhinorrhea, sinus pressure, sore throat and tinnitus.   Eyes: Negative for pain, discharge and visual disturbance.  Respiratory: Negative for cough and shortness of breath.   Cardiovascular: Negative for chest pain, palpitations and leg swelling.  Gastrointestinal: Negative for abdominal distention, abdominal pain, blood in stool, constipation, diarrhea, nausea and vomiting.  Genitourinary: Negative for difficulty urinating, dysuria, flank pain, frequency, hematuria, pelvic pain, urgency, vaginal bleeding, vaginal discharge and vaginal pain.  Musculoskeletal: Positive for arthralgias and joint swelling. Negative for gait problem.  Skin: Negative for rash.    Neurological: Negative for dizziness, syncope, speech difficulty, weakness, numbness and headaches.  Hematological: Negative for adenopathy.  Psychiatric/Behavioral: Negative for agitation, behavioral problems and dysphoric mood. The patient is not nervous/anxious.        Objective:   Physical Exam  Constitutional: She appears well-nourished. No distress.  Musculoskeletal:  Soft tissue swelling involving the left media lower leg just proximal to the ankle.  There is slight erythema and tenderness, but no excessive warmth.  Did not appear to have joint involvement    Normal gait.  No limp      Assessment & Plan:   Left medial lower leg pain, possible tendinitis.  Possible superficial phlebitis.  Will treat with Mobic  and observe History of PMR with shoulder pain.  Check sedimentation rate  Nyoka Cowden

## 2017-03-01 DIAGNOSIS — Z85828 Personal history of other malignant neoplasm of skin: Secondary | ICD-10-CM | POA: Diagnosis not present

## 2017-03-01 DIAGNOSIS — L570 Actinic keratosis: Secondary | ICD-10-CM | POA: Diagnosis not present

## 2017-03-01 DIAGNOSIS — Z86018 Personal history of other benign neoplasm: Secondary | ICD-10-CM | POA: Diagnosis not present

## 2017-03-01 DIAGNOSIS — L821 Other seborrheic keratosis: Secondary | ICD-10-CM | POA: Diagnosis not present

## 2017-03-28 ENCOUNTER — Encounter: Payer: Self-pay | Admitting: Internal Medicine

## 2017-03-28 ENCOUNTER — Ambulatory Visit (INDEPENDENT_AMBULATORY_CARE_PROVIDER_SITE_OTHER): Payer: Medicare Other | Admitting: Internal Medicine

## 2017-03-28 VITALS — BP 118/64 | HR 83 | Temp 98.3°F | Ht 62.5 in | Wt 115.0 lb

## 2017-03-28 DIAGNOSIS — M353 Polymyalgia rheumatica: Secondary | ICD-10-CM

## 2017-03-28 MED ORDER — MELOXICAM 7.5 MG PO TABS: 7.5000 mg | ORAL_TABLET | Freq: Every day | ORAL | 3 refills | Status: DC

## 2017-03-28 NOTE — Progress Notes (Signed)
Subjective:    Patient ID: Savannah Jimenez, female    DOB: 03-30-1936, 81 y.o.   MRN: [id number]  HPI  81 year old patient who is seen today in follow-up. She was seen recently with some musculoskeletal complaints that resolved after taking meloxicam. Concerns today include some mild pedal edema as well as some ankle pain mainly in the left involving the Achilles tendon area She remains quite active with the activities at her health club  Past Medical History:  Diagnosis Date  . Allergy   . Arthritis   . GERD (gastroesophageal reflux disease)   . HOH (hard of hearing)   . Osteoporosis      Social History   Social History  . Marital status: Married    Spouse name: N/A  . Number of children: N/A  . Years of education: N/A   Occupational History  . Not on file.   Social History Main Topics  . Smoking status: Never Smoker  . Smokeless tobacco: Never Used  . Alcohol use Yes     Comment: rarely  . Drug use: No  . Sexual activity: Not on file   Other Topics Concern  . Not on file   Social History Narrative  . No narrative on file    Past Surgical History:  Procedure Laterality Date  . ABDOMINAL HYSTERECTOMY    . ARTERY BIOPSY Right 10/06/2014   Procedure: BIOPSY TEMPORAL ARTERY;  Surgeon: Armandina Gemma, MD;  Location: [facility];  Service: General;  Laterality: Right;  . CATARACT EXTRACTION  10/2011   Left  . colonscopy  2007  . INNER EAR SURGERY  1974   both stapes replaced-steal  . TUBAL LIGATION      Family History  Problem Relation Age of Onset  . Heart failure Mother   . Hypertension Sister   . Asthma Sister   . Hypertension Brother     Allergies  Allergen Reactions  . Tetanus Toxoids     Arm swelling   . Penicillins   . Sulfacetamide Sodium     Current Outpatient Prescriptions on File Prior to Visit  Medication Sig Dispense Refill  . calcium citrate-vitamin D (CITRACAL+D) 315-200 MG-UNIT tablet Take 1 tablet by mouth daily.    .  fish oil-omega-3 fatty acids 1000 MG capsule Take 2 g by mouth daily.      . fluticasone (FLONASE) 50 MCG/ACT nasal spray SHAKE WELL AND USE 2 SPRAYS IN EACH NOSTRIL DAILY 16 g 3  . Melatonin 3 MG TABS Take 1 tablet by mouth at bedtime.    . Multiple Vitamins-Minerals (PRESERVISION AREDS 2 PO) Take 1 tablet by mouth 3 (three) times a week.     . zolpidem (AMBIEN) 5 MG tablet Take 1 tablet (5 mg total) by mouth at bedtime as needed. 90 tablet 1   No current facility-administered medications on file prior to visit.     BP 118/64 (BP Location: Left Arm, Patient Position: Sitting, Cuff Size: Normal)   Pulse 83   Temp 98.3 F (36.8 C) (Oral)   Ht 5' 2.5" (1.588 m)   Wt 115 lb (52.2 kg)   SpO2 97%   BMI 20.70 kg/m     Review of Systems  Constitutional: Negative.   HENT: Negative for congestion, dental problem, hearing loss, rhinorrhea, sinus pressure, sore throat and tinnitus.   Eyes: Negative for pain, discharge and visual disturbance.  Respiratory: Negative for cough and shortness of breath.   Cardiovascular: Positive for leg swelling. Negative  for chest pain and palpitations.  Gastrointestinal: Negative for abdominal distention, abdominal pain, blood in stool, constipation, diarrhea, nausea and vomiting.  Genitourinary: Negative for difficulty urinating, dysuria, flank pain, frequency, hematuria, pelvic pain, urgency, vaginal bleeding, vaginal discharge and vaginal pain.  Musculoskeletal: Positive for arthralgias. Negative for gait problem and joint swelling.  Skin: Negative for rash.  Neurological: Negative for dizziness, syncope, speech difficulty, weakness, numbness and headaches.  Hematological: Negative for adenopathy.  Psychiatric/Behavioral: Negative for agitation, behavioral problems and dysphoric mood. The patient is not nervous/anxious.        Objective:   Physical Exam  Constitutional: She is oriented to person, place, and time. She appears well-developed and  well-nourished.  HENT:  Head: Normocephalic.  Right Ear: External ear normal.  Left Ear: External ear normal.  Mouth/Throat: Oropharynx is clear and moist.  Eyes: Pupils are equal, round, and reactive to light. Conjunctivae and EOM are normal.  Neck: Normal range of motion. Neck supple. No thyromegaly present.  Cardiovascular: Normal rate, regular rhythm, normal heart sounds and intact distal pulses.   Pulmonary/Chest: Effort normal and breath sounds normal.  Abdominal: Soft. Bowel sounds are normal. She exhibits no mass. There is no tenderness.  Musculoskeletal: Normal range of motion. She exhibits edema.  Trace pedal edema  Lymphadenopathy:    She has no cervical adenopathy.  Neurological: She is alert and oriented to person, place, and time.  Skin: Skin is warm and dry. No rash noted.  Psychiatric: She has a normal mood and affect. Her behavior is normal.          Assessment & Plan:   Mild pedal edema.  Low-salt diet.  Encouraged may have been aggravated by meloxicam History of PMR.  In remission  Return when necessary or in 6 months  [place]

## 2017-03-28 NOTE — Patient Instructions (Signed)
Call or return to clinic prn if these symptoms worsen or fail to improve as anticipated.  Return as scheduled for your annual exam

## 2017-04-12 ENCOUNTER — Encounter: Payer: Self-pay | Admitting: Internal Medicine

## 2017-05-01 DIAGNOSIS — Z23 Encounter for immunization: Secondary | ICD-10-CM | POA: Diagnosis not present

## 2017-05-24 ENCOUNTER — Other Ambulatory Visit: Payer: Self-pay | Admitting: Internal Medicine

## 2017-05-24 DIAGNOSIS — Z1231 Encounter for screening mammogram for malignant neoplasm of breast: Secondary | ICD-10-CM

## 2017-06-25 ENCOUNTER — Encounter: Payer: Self-pay | Admitting: Internal Medicine

## 2017-06-25 ENCOUNTER — Ambulatory Visit (INDEPENDENT_AMBULATORY_CARE_PROVIDER_SITE_OTHER): Payer: Medicare Other | Admitting: Internal Medicine

## 2017-06-25 ENCOUNTER — Encounter: Payer: Medicare Other | Admitting: Internal Medicine

## 2017-06-25 VITALS — BP 122/64 | HR 74 | Temp 98.1°F | Ht 62.5 in | Wt 117.2 lb

## 2017-06-25 DIAGNOSIS — M353 Polymyalgia rheumatica: Secondary | ICD-10-CM | POA: Diagnosis not present

## 2017-06-25 DIAGNOSIS — E785 Hyperlipidemia, unspecified: Secondary | ICD-10-CM | POA: Diagnosis not present

## 2017-06-25 DIAGNOSIS — E2839 Other primary ovarian failure: Secondary | ICD-10-CM | POA: Diagnosis not present

## 2017-06-25 DIAGNOSIS — M150 Primary generalized (osteo)arthritis: Secondary | ICD-10-CM

## 2017-06-25 DIAGNOSIS — M159 Polyosteoarthritis, unspecified: Secondary | ICD-10-CM

## 2017-06-25 DIAGNOSIS — M818 Other osteoporosis without current pathological fracture: Secondary | ICD-10-CM | POA: Diagnosis not present

## 2017-06-25 DIAGNOSIS — Z0000 Encounter for general adult medical examination without abnormal findings: Secondary | ICD-10-CM

## 2017-06-25 LAB — CBC WITH DIFFERENTIAL/PLATELET
Basophils Absolute: 0.1 10*3/uL (ref 0.0–0.1)
Basophils Relative: 1.1 % (ref 0.0–3.0)
EOS ABS: 0 10*3/uL (ref 0.0–0.7)
Eosinophils Relative: 0.5 % (ref 0.0–5.0)
HEMATOCRIT: 37.8 % (ref 36.0–46.0)
HEMOGLOBIN: 12.3 g/dL (ref 12.0–15.0)
LYMPHS ABS: 1.6 10*3/uL (ref 0.7–4.0)
LYMPHS PCT: 28.5 % (ref 12.0–46.0)
MCHC: 32.5 g/dL (ref 30.0–36.0)
MCV: 95.7 fl (ref 78.0–100.0)
MONO ABS: 0.4 10*3/uL (ref 0.1–1.0)
Monocytes Relative: 6.6 % (ref 3.0–12.0)
NEUTROS ABS: 3.5 10*3/uL (ref 1.4–7.7)
Neutrophils Relative %: 63.3 % (ref 43.0–77.0)
Platelets: 261 10*3/uL (ref 150.0–400.0)
RBC: 3.95 Mil/uL (ref 3.87–5.11)
RDW: 13.8 % (ref 11.5–15.5)
WBC: 5.5 10*3/uL (ref 4.0–10.5)

## 2017-06-25 LAB — LIPID PANEL
CHOL/HDL RATIO: 4
CHOLESTEROL: 242 mg/dL — AB (ref 0–200)
HDL: 66.7 mg/dL (ref >39.00)
LDL Cholesterol: 147 mg/dL — ABNORMAL HIGH (ref 0–99)
NonHDL: 174.97
TRIGLYCERIDES: 139 mg/dL (ref 0.0–149.0)
VLDL: 27.8 mg/dL (ref 0.0–40.0)

## 2017-06-25 LAB — COMPREHENSIVE METABOLIC PANEL
ALT: 11 U/L (ref 0–35)
AST: 18 U/L (ref 0–37)
Albumin: 4.4 g/dL (ref 3.5–5.2)
Alkaline Phosphatase: 61 U/L (ref 39–117)
BILIRUBIN TOTAL: 0.8 mg/dL (ref 0.2–1.2)
BUN: 20 mg/dL (ref 6–23)
CALCIUM: 9.6 mg/dL (ref 8.4–10.5)
CO2: 29 meq/L (ref 19–32)
CREATININE: 0.56 mg/dL (ref 0.40–1.20)
Chloride: 102 mEq/L (ref 96–112)
GFR: 111 mL/min (ref >60.00)
GLUCOSE: 97 mg/dL (ref 70–99)
Potassium: 4.2 mEq/L (ref 3.5–5.1)
SODIUM: 140 meq/L (ref 135–145)
Total Protein: 7 g/dL (ref 6.0–8.3)

## 2017-06-25 LAB — SEDIMENTATION RATE: Sed Rate: 15 mm/hr (ref 0–30)

## 2017-06-25 LAB — TSH: TSH: 1.99 u[IU]/mL (ref 0.35–4.50)

## 2017-06-25 MED ORDER — ZOLPIDEM TARTRATE 5 MG PO TABS: 5.0000 mg | ORAL_TABLET | Freq: Every evening | ORAL | 2 refills | Status: DC | PRN

## 2017-06-25 MED ORDER — MELOXICAM 7.5 MG PO TABS: 7.5000 mg | ORAL_TABLET | Freq: Every day | ORAL | 3 refills | Status: DC

## 2017-06-25 NOTE — Progress Notes (Signed)
Subjective:    Patient ID: Savannah Jimenez, female    DOB: 1936/02/07, 81 y.o.   MRN: [id number]  HPI 81 year old patient who is seen today for annual follow-up and subsequent Medicare wellness visit She has remote history of PMR.  She states she is having increasing pain involving neck thighs and low back area.  She has not been using meloxicam which has been quite helpful in the past.  She does have a history of osteopenia.  She remains on calcium and vitamin D supplements Otherwise doing well.  She did have a mammogram performed in the spring but no follow-up bone density study  Family history noncontributory.  Both parents died at age 10.  Father from complications of sepsis and mother from congestive heart failure both with a history of hypertension One brother with hypertension 1 sister with end-stage renal disease hypertension and dyslipidemia   Past Medical History:  Diagnosis Date  . Allergy   . Arthritis   . GERD (gastroesophageal reflux disease)   . HOH (hard of hearing)   . Osteoporosis      Social History   Socioeconomic History  . Marital status: Married    Spouse name: Not on file  . Number of children: Not on file  . Years of education: Not on file  . Highest education level: Not on file  Social Needs  . Financial resource strain: Not on file  . Food insecurity - worry: Not on file  . Food insecurity - inability: Not on file  . Transportation needs - medical: Not on file  . Transportation needs - non-medical: Not on file  Occupational History  . Not on file  Tobacco Use  . Smoking status: Never Smoker  . Smokeless tobacco: Never Used  Substance and Sexual Activity  . Alcohol use: Yes    Comment: rarely  . Drug use: No  . Sexual activity: Not on file  Other Topics Concern  . Not on file  Social History Narrative  . Not on file    Past Surgical History:  Procedure Laterality Date  . ABDOMINAL HYSTERECTOMY    . ARTERY BIOPSY Right 10/06/2014   Procedure: BIOPSY TEMPORAL ARTERY;  Surgeon: Armandina Gemma, MD;  Location: [facility];  Service: General;  Laterality: Right;  . CATARACT EXTRACTION  10/2011   Left  . colonscopy  2007  . INNER EAR SURGERY  1974   both stapes replaced-steal  . TUBAL LIGATION      Family History  Problem Relation Age of Onset  . Heart failure Mother   . Hypertension Sister   . Asthma Sister   . Hypertension Brother     Allergies  Allergen Reactions  . Tetanus Toxoids     Arm swelling   . Penicillins   . Sulfacetamide Sodium     Current Outpatient Medications on File Prior to Visit  Medication Sig Dispense Refill  . calcium citrate-vitamin D (CITRACAL+D) 315-200 MG-UNIT tablet Take 1 tablet by mouth daily.    . fish oil-omega-3 fatty acids 1000 MG capsule Take 2 g by mouth daily.      . fluticasone (FLONASE) 50 MCG/ACT nasal spray SHAKE WELL AND USE 2 SPRAYS IN EACH NOSTRIL DAILY 16 g 3  . Melatonin 3 MG TABS Take 1 tablet by mouth at bedtime.    . meloxicam (MOBIC) 7.5 MG tablet Take 1 tablet (7.5 mg total) by mouth daily. 30 tablet 3  . Multiple Vitamins-Minerals (PRESERVISION AREDS 2 PO) Take  1 tablet by mouth 3 (three) times a week.     . Multiple Vitamins-Minerals (VISION-VITE PRESERVE PO) Take 2 tablets by mouth daily.    Marland Kitchen zolpidem (AMBIEN) 5 MG tablet Take 1 tablet (5 mg total) by mouth at bedtime as needed. 90 tablet 1   No current facility-administered medications on file prior to visit.     BP 122/64 (BP Location: Left Arm, Patient Position: Sitting, Cuff Size: Normal)   Pulse 74   Temp 98.1 F (36.7 C) (Oral)   Ht 5' 2.5" (1.588 m)   Wt 117 lb 3.2 oz (53.2 kg)   SpO2 96%   BMI 21.09 kg/m    Subsequent Medicare wellness visit  1. Risk factors, based on past  M,S,F history.  No significant cardiovascular risk factors  2.  Physical activities: Remains quite active but limited somewhat by musculoskeletal complaints.  She generally gets to her health club 3 times  per week with activities on treadmill and light weights  3.  Depression/mood: No history of major depression or mood disorder  4.  Hearing: Wears hearing aids bilaterally  5.  ADL's: Independent  6.  Fall risk: Low  7.  Home safety: No problems identified  8.  Height weight, and visual acuity; height and weight stable no change in visual acuity  9.  Counseling:  Follow-up bone density study encouraged 10. Lab orders based on risk factors: Laboratory update including ESR will be reviewed 11. Referral : None appropriate at this time  12. Care plan: Cologuard testing; bone density  13. Cognitive assessment: Alert and appropriate normal affect.  No cognitive dysfunction  14. Screening: Patient provided with a written and personalized 5-10 year screening schedule in the AVS.    15. Provider List Update: Primary care ophthalmology    Review of Systems  Constitutional: Negative.   HENT: Negative for congestion, dental problem, hearing loss, rhinorrhea, sinus pressure, sore throat and tinnitus.   Eyes: Negative for pain, discharge and visual disturbance.  Respiratory: Negative for cough and shortness of breath.   Cardiovascular: Negative for chest pain, palpitations and leg swelling.  Gastrointestinal: Negative for abdominal distention, abdominal pain, blood in stool, constipation, diarrhea, nausea and vomiting.  Genitourinary: Negative for difficulty urinating, dysuria, flank pain, frequency, hematuria, pelvic pain, urgency, vaginal bleeding, vaginal discharge and vaginal pain.  Musculoskeletal: Positive for arthralgias and back pain. Negative for gait problem and joint swelling.  Skin: Negative for rash.  Neurological: Negative for dizziness, syncope, speech difficulty, weakness, numbness and headaches.  Hematological: Negative for adenopathy.  Psychiatric/Behavioral: Negative for agitation, behavioral problems and dysphoric mood. The patient is not nervous/anxious.          Objective:   Physical Exam  Constitutional: She is oriented to person, place, and time. She appears well-developed and well-nourished.  HENT:  Head: Normocephalic.  Right Ear: External ear normal.  Left Ear: External ear normal.  Mouth/Throat: Oropharynx is clear and moist.  Eyes: Conjunctivae and EOM are normal. Pupils are equal, round, and reactive to light.  Neck: Normal range of motion. Neck supple. No thyromegaly present.  Symmetrical fullness in the posterior cervical area  Cardiovascular: Normal rate, regular rhythm, normal heart sounds and intact distal pulses.  Pulmonary/Chest: Effort normal and breath sounds normal.  Abdominal: Soft. Bowel sounds are normal. She exhibits no mass. There is no tenderness.  Musculoskeletal: Normal range of motion.  Lymphadenopathy:    She has no cervical adenopathy.  Neurological: She is alert and oriented to person, place,  and time.  Skin: Skin is warm and dry. No rash noted.  Psychiatric: She has a normal mood and affect. Her behavior is normal.          Assessment & Plan:   History of osteopenia.  Will check a follow-up bone density study Subsequent Medicare wellness visit Hard of hearing Osteoarthritis History of polymyalgia rheumatica.  Will check ESR Gastrosoft reflux disease  Continue calcium and vitamin D supplements Nyoka Cowden

## 2017-06-25 NOTE — Patient Instructions (Addendum)
Health Maintenance for Postmenopausal Women Menopause is a normal process in which your reproductive ability comes to an end. This process happens gradually over a span of months to years, usually between the ages of 22 and 9. Menopause is complete when you have missed 12 consecutive menstrual periods. It is important to talk with your health care provider about some of the most common conditions that affect postmenopausal women, such as heart disease, cancer, and bone loss (osteoporosis). Adopting a healthy lifestyle and getting preventive care can help to promote your health and wellness. Those actions can also lower your chances of developing some of these common conditions. What should I know about menopause? During menopause, you may experience a number of symptoms, such as:  Moderate-to-severe hot flashes.  Night sweats.  Decrease in sex drive.  Mood swings.  Headaches.  Tiredness.  Irritability.  Memory problems.  Insomnia.  Choosing to treat or not to treat menopausal changes is an individual decision that you make with your health care provider. What should I know about hormone replacement therapy and supplements? Hormone therapy products are effective for treating symptoms that are associated with menopause, such as hot flashes and night sweats. Hormone replacement carries certain risks, especially as you become older. If you are thinking about using estrogen or estrogen with progestin treatments, discuss the benefits and risks with your health care provider. What should I know about heart disease and stroke? Heart disease, heart attack, and stroke become more likely as you age. This may be due, in part, to the hormonal changes that your body experiences during menopause. These can affect how your body processes dietary fats, triglycerides, and cholesterol. Heart attack and stroke are both medical emergencies. There are many things that you can do to help prevent heart disease  and stroke:  Have your blood pressure checked at least every 1-2 years. High blood pressure causes heart disease and increases the risk of stroke.  If you are 53-22 years old, ask your health care provider if you should take aspirin to prevent a heart attack or a stroke.  Do not use any tobacco products, including cigarettes, chewing tobacco, or electronic cigarettes. If you need help quitting, ask your health care provider.  It is important to eat a healthy diet and maintain a healthy weight. ? Be sure to include plenty of vegetables, fruits, low-fat dairy products, and lean protein. ? Avoid eating foods that are high in solid fats, added sugars, or salt (sodium).  Get regular exercise. This is one of the most important things that you can do for your health. ? Try to exercise for at least 150 minutes each week. The type of exercise that you do should increase your heart rate and make you sweat. This is known as moderate-intensity exercise. ? Try to do strengthening exercises at least twice each week. Do these in addition to the moderate-intensity exercise.  Know your numbers.Ask your health care provider to check your cholesterol and your blood glucose. Continue to have your blood tested as directed by your health care provider.  What should I know about cancer screening? There are several types of cancer. Take the following steps to reduce your risk and to catch any cancer development as early as possible. Breast Cancer  Practice breast self-awareness. ? This means understanding how your breasts normally appear and feel. ? It also means doing regular breast self-exams. Let your health care provider know about any changes, no matter how small.  If you are 40  or older, have a clinician do a breast exam (clinical breast exam or CBE) every year. Depending on your age, family history, and medical history, it may be recommended that you also have a yearly breast X-ray (mammogram).  If you  have a family history of breast cancer, talk with your health care provider about genetic screening.  If you are at high risk for breast cancer, talk with your health care provider about having an MRI and a mammogram every year.  Breast cancer (BRCA) gene test is recommended for women who have family members with BRCA-related cancers. Results of the assessment will determine the need for genetic counseling and BRCA1 and for BRCA2 testing. BRCA-related cancers include these types: ? Breast. This occurs in males or females. ? Ovarian. ? Tubal. This may also be called fallopian tube cancer. ? Cancer of the abdominal or pelvic lining (peritoneal cancer). ? Prostate. ? Pancreatic.  Cervical, Uterine, and Ovarian Cancer Your health care provider may recommend that you be screened regularly for cancer of the pelvic organs. These include your ovaries, uterus, and vagina. This screening involves a pelvic exam, which includes checking for microscopic changes to the surface of your cervix (Pap test).  For women ages 21-65, health care providers may recommend a pelvic exam and a Pap test every three years. For women ages 79-65, they may recommend the Pap test and pelvic exam, combined with testing for human papilloma virus (HPV), every five years. Some types of HPV increase your risk of cervical cancer. Testing for HPV may also be done on women of any age who have unclear Pap test results.  Other health care providers may not recommend any screening for nonpregnant women who are considered low risk for pelvic cancer and have no symptoms. Ask your health care provider if a screening pelvic exam is right for you.  If you have had past treatment for cervical cancer or a condition that could lead to cancer, you need Pap tests and screening for cancer for at least 20 years after your treatment. If Pap tests have been discontinued for you, your risk factors (such as having a new sexual partner) need to be  reassessed to determine if you should start having screenings again. Some women have medical problems that increase the chance of getting cervical cancer. In these cases, your health care provider may recommend that you have screening and Pap tests more often.  If you have a family history of uterine cancer or ovarian cancer, talk with your health care provider about genetic screening.  If you have vaginal bleeding after reaching menopause, tell your health care provider.  There are currently no reliable tests available to screen for ovarian cancer.  Lung Cancer Lung cancer screening is recommended for adults 69-62 years old who are at high risk for lung cancer because of a history of smoking. A yearly low-dose CT scan of the lungs is recommended if you:  Currently smoke.  Have a history of at least 30 pack-years of smoking and you currently smoke or have quit within the past 15 years. A pack-year is smoking an average of one pack of cigarettes per day for one year.  Yearly screening should:  Continue until it has been 15 years since you quit.  Stop if you develop a health problem that would prevent you from having lung cancer treatment.  Colorectal Cancer  This type of cancer can be detected and can often be prevented.  Routine colorectal cancer screening usually begins at  age 42 and continues through age 45.  If you have risk factors for colon cancer, your health care provider may recommend that you be screened at an earlier age.  If you have a family history of colorectal cancer, talk with your health care provider about genetic screening.  Your health care provider may also recommend using home test kits to check for hidden blood in your stool.  A small camera at the end of a tube can be used to examine your colon directly (sigmoidoscopy or colonoscopy). This is done to check for the earliest forms of colorectal cancer.  Direct examination of the colon should be repeated every  5-10 years until age 71. However, if early forms of precancerous polyps or small growths are found or if you have a family history or genetic risk for colorectal cancer, you may need to be screened more often.  Skin Cancer  Check your skin from head to toe regularly.  Monitor any moles. Be sure to tell your health care provider: ? About any new moles or changes in moles, especially if there is a change in a mole's shape or color. ? If you have a mole that is larger than the size of a pencil eraser.  If any of your family members has a history of skin cancer, especially at a young age, talk with your health care provider about genetic screening.  Always use sunscreen. Apply sunscreen liberally and repeatedly throughout the day.  Whenever you are outside, protect yourself by wearing long sleeves, pants, a wide-brimmed hat, and sunglasses.  What should I know about osteoporosis? Osteoporosis is a condition in which bone destruction happens more quickly than new bone creation. After menopause, you may be at an increased risk for osteoporosis. To help prevent osteoporosis or the bone fractures that can happen because of osteoporosis, the following is recommended:  If you are 46-71 years old, get at least 1,000 mg of calcium and at least 600 mg of vitamin D per day.  If you are older than age 55 but younger than age 65, get at least 1,200 mg of calcium and at least 600 mg of vitamin D per day.  If you are older than age 54, get at least 1,200 mg of calcium and at least 800 mg of vitamin D per day.  Smoking and excessive alcohol intake increase the risk of osteoporosis. Eat foods that are rich in calcium and vitamin D, and do weight-bearing exercises several times each week as directed by your health care provider. What should I know about how menopause affects my mental health? Depression may occur at any age, but it is more common as you become older. Common symptoms of depression  include:  Low or sad mood.  Changes in sleep patterns.  Changes in appetite or eating patterns.  Feeling an overall lack of motivation or enjoyment of activities that you previously enjoyed.  Frequent crying spells.  Talk with your health care provider if you think that you are experiencing depression. What should I know about immunizations? It is important that you get and maintain your immunizations. These include:  Tetanus, diphtheria, and pertussis (Tdap) booster vaccine.  Influenza every year before the flu season begins.  Pneumonia vaccine.  Shingles vaccine.  Your health care provider may also recommend other immunizations. This information is not intended to replace advice given to you by your health care provider. Make sure you discuss any questions you have with your health care provider. Document Released: 09/01/2005  Document Revised: 01/28/2016 Document Reviewed: 04/13/2015 Elsevier Interactive Patient Education  2018 [employer] Inc[employer]  

## 2017-06-27 ENCOUNTER — Ambulatory Visit: Payer: Medicare Other

## 2017-06-27 NOTE — Progress Notes (Unsigned)
Subjective:   Savannah Jimenez is a 81 y.o. female who presents for Medicare Annual (Subsequent) preventive examination.  The Patient was informed that the wellness visit is to identify future health risk and educate and initiate measures that can reduce risk for increased disease through the lifespan.    Annual Wellness Assessment  Reports health as   Preventive Screening -Counseling & Management  Medicare Annual Preventive Care Visit - Subsequent Last OV 06/25/2017  Describes Health as poor, fair, good or great?   VS reviewed;   Diet   BMI  Exercise  Dental  Stressors:   Sleep patterns:   Pain?    Cardiac Risk Factors Addressed Hyperlipidemia Diabetes Pre-diabetes A1c Obesity  Advanced Directives  Patient Care Team: Marletta Lor, MD as PCP - General Zigmund Daniel Chrystie Nose, MD as Consulting Physician (Ophthalmology)               Objective:     Vitals: There were no vitals taken for this visit.  There is no height or weight on file to calculate BMI.  Advanced Directives 10/06/2014 10/02/2014  Does Patient Have a Medical Advance Directive? Yes Yes  Type of Advance Directive - [place];Living will  Does patient want to make changes to medical advance directive? - No - Patient declined  Copy of [place] in Chart? No - copy requested -    Tobacco Social History   Tobacco Use  Smoking Status Never Smoker  Smokeless Tobacco Never Used     Counseling given: Not Answered   Clinical Intake:                                Past Medical History:  Diagnosis Date  . Allergy   . Arthritis   . GERD (gastroesophageal reflux disease)   . HOH (hard of hearing)   . Osteoporosis    Past Surgical History:  Procedure Laterality Date  . ABDOMINAL HYSTERECTOMY    . ARTERY BIOPSY Right 10/06/2014   Procedure: BIOPSY TEMPORAL ARTERY;  Surgeon: Armandina Gemma, MD;  Location: [facility];  Service: General;  Laterality: Right;  . CATARACT EXTRACTION  10/2011   Left  . colonscopy  2007  . INNER EAR SURGERY  1974   both stapes replaced-steal  . TUBAL LIGATION     Family History  Problem Relation Age of Onset  . Heart failure Mother   . Hypertension Sister   . Asthma Sister   . Hypertension Brother    Social History   Socioeconomic History  . Marital status: Married    Spouse name: Not on file  . Number of children: Not on file  . Years of education: Not on file  . Highest education level: Not on file  Social Needs  . Financial resource strain: Not on file  . Food insecurity - worry: Not on file  . Food insecurity - inability: Not on file  . Transportation needs - medical: Not on file  . Transportation needs - non-medical: Not on file  Occupational History  . Not on file  Tobacco Use  . Smoking status: Never Smoker  . Smokeless tobacco: Never Used  Substance and Sexual Activity  . Alcohol use: Yes    Comment: rarely  . Drug use: No  . Sexual activity: Not on file  Other Topics Concern  . Not on file  Social History Narrative  .  Not on file    Outpatient Encounter Medications as of 06/27/2017  Medication Sig  . calcium citrate-vitamin D (CITRACAL+D) 315-200 MG-UNIT tablet Take 1 tablet by mouth daily.  . fish oil-omega-3 fatty acids 1000 MG capsule Take 2 g by mouth daily.    . fluticasone (FLONASE) 50 MCG/ACT nasal spray SHAKE WELL AND USE 2 SPRAYS IN EACH NOSTRIL DAILY  . Melatonin 3 MG TABS Take 1 tablet by mouth at bedtime.  . meloxicam (MOBIC) 7.5 MG tablet Take 1 tablet (7.5 mg total) by mouth daily.  . Multiple Vitamins-Minerals (PRESERVISION AREDS 2 PO) Take 1 tablet by mouth 3 (three) times a week.   . Multiple Vitamins-Minerals (VISION-VITE PRESERVE PO) Take 2 tablets by mouth daily.  Marland Kitchen zolpidem (AMBIEN) 5 MG tablet Take 1 tablet (5 mg total) by mouth at bedtime as needed.   No facility-administered encounter medications on file as  of 06/27/2017.     Activities of Daily Living No flowsheet data found.  Timed Get Up and Go performed: ***  Patient Care Team: Marletta Lor, MD as PCP - General Hayden Pedro, MD as Consulting Physician (Ophthalmology)    Assessment:    *** Exercise Activities and Dietary recommendations    Goals    None     Fall Risk Fall Risk  11/22/2016 06/23/2016 06/21/2015 06/03/2014 04/23/2014  Falls in the past year? No No No No No   Is the patient's home free of loose throw rugs in walkways, pet beds, electrical cords, etc?   {Blank single:19197::"yes","no"}      Grab bars in the bathroom? {Blank single:19197::"yes","no"}      Handrails on the stairs?   {Blank single:19197::"yes","no"}      Adequate lighting?   {Blank single:19197::"yes","no"}  Depression Screen PHQ 2/9 Scores 11/22/2016 06/23/2016 06/21/2015 06/03/2014  PHQ - 2 Score 0 0 0 0     Cognitive Function        Immunization History  Administered Date(s) Administered  . Influenza Whole 04/05/2010  . Influenza, High Dose Seasonal PF 04/30/2014, 05/04/2015, 04/25/2016  . Influenza-Unspecified 05/01/2017  . Pneumococcal Conjugate-13 06/03/2014  . Pneumococcal Polysaccharide-23 04/19/2009  . Zoster 12/24/2009   Screening Tests Health Maintenance  Topic Date Due  . TETANUS/TDAP  05/30/2021  . INFLUENZA VACCINE  Completed  . DEXA SCAN  Completed  . PNA vac Low Risk Adult  Completed   Cancer Screenings: Lung: *** Low Dose CT Chest recommended if Age 69-80 years, 30 pack-year currently smoking OR have quit w/in 15years. Patient {DOES NOT does:27190::"does not"} qualify. Breast: *** Up to date on Mammogram? {Yes/No:30480221}  Up to date of Bone Density/Dexa? {Yes/No:30480221} Colorectal: ***  Additional Screenings: *** Hepatitis B/HIV/Syphillis: Hepatitis C Screening:      Plan:   ***   I have personally reviewed and noted the following in the patient's chart:   . Medical and social history . Use  of alcohol, tobacco or illicit drugs  . Current medications and supplements . Functional ability and status . Nutritional status . Physical activity . Advanced directives . List of other physicians . Hospitalizations, surgeries, and ER visits in previous 12 months . Vitals . Screenings to include cognitive, depression, and falls . Referrals and appointments  In addition, I have reviewed and discussed with patient certain preventive protocols, quality metrics, and best practice recommendations. A written personalized care plan for preventive services as well as general preventive health recommendations were provided to patient.     Wynetta Fines, RN  06/27/2017

## 2017-07-10 DIAGNOSIS — Z1211 Encounter for screening for malignant neoplasm of colon: Secondary | ICD-10-CM | POA: Diagnosis not present

## 2017-07-10 DIAGNOSIS — Z1212 Encounter for screening for malignant neoplasm of rectum: Secondary | ICD-10-CM | POA: Diagnosis not present

## 2017-07-12 LAB — COLOGUARD: COLOGUARD: NEGATIVE

## 2017-08-03 ENCOUNTER — Encounter: Payer: Self-pay | Admitting: Internal Medicine

## 2017-09-05 DIAGNOSIS — Z23 Encounter for immunization: Secondary | ICD-10-CM | POA: Diagnosis not present

## 2017-09-05 DIAGNOSIS — L570 Actinic keratosis: Secondary | ICD-10-CM | POA: Diagnosis not present

## 2017-09-05 DIAGNOSIS — L821 Other seborrheic keratosis: Secondary | ICD-10-CM | POA: Diagnosis not present

## 2017-10-12 DIAGNOSIS — Z23 Encounter for immunization: Secondary | ICD-10-CM | POA: Diagnosis not present

## 2017-10-12 DIAGNOSIS — L309 Dermatitis, unspecified: Secondary | ICD-10-CM | POA: Diagnosis not present

## 2017-10-31 ENCOUNTER — Ambulatory Visit: Payer: Medicare Other

## 2017-10-31 ENCOUNTER — Other Ambulatory Visit: Payer: Medicare Other

## 2017-11-05 ENCOUNTER — Ambulatory Visit
Admission: RE | Admit: 2017-11-05 | Discharge: 2017-11-05 | Disposition: A | Discharge status: Home / Self Care | Payer: Medicare Other | Source: Ambulatory Visit | Attending: Internal Medicine | Admitting: Internal Medicine

## 2017-11-05 DIAGNOSIS — Z780 Asymptomatic menopausal state: Secondary | ICD-10-CM | POA: Diagnosis not present

## 2017-11-05 DIAGNOSIS — Z1231 Encounter for screening mammogram for malignant neoplasm of breast: Secondary | ICD-10-CM | POA: Diagnosis not present

## 2017-11-05 DIAGNOSIS — M8589 Other specified disorders of bone density and structure, multiple sites: Secondary | ICD-10-CM | POA: Diagnosis not present

## 2017-11-16 ENCOUNTER — Ambulatory Visit (INDEPENDENT_AMBULATORY_CARE_PROVIDER_SITE_OTHER): Payer: Medicare Other | Admitting: Ophthalmology

## 2017-11-23 ENCOUNTER — Encounter (INDEPENDENT_AMBULATORY_CARE_PROVIDER_SITE_OTHER): Payer: Medicare Other | Admitting: Ophthalmology

## 2017-11-23 DIAGNOSIS — H43813 Vitreous degeneration, bilateral: Secondary | ICD-10-CM

## 2017-11-23 DIAGNOSIS — H43823 Vitreomacular adhesion, bilateral: Secondary | ICD-10-CM | POA: Diagnosis not present

## 2017-11-27 ENCOUNTER — Telehealth: Payer: Self-pay

## 2017-11-27 NOTE — Telephone Encounter (Signed)
Copied from [place] [id number]. Topic: General - Other >> Nov 27, 2017  2:11 PM Oneta Rack wrote: Relation to pt: self Call back number: [phone]   Reason for call: patient states Dr. Burnice Logan will be retiring and patient would like to transfer to Dr. Larose Kells, please advise >> Nov 27, 2017  2:24 PM Oneta Rack wrote: Relation to pt: self Call back number: [phone]   Reason for call: patient states Dr. Burnice Logan will be retiring and patient would like to transfer to Dr. Larose Kells, please advise

## 2017-11-27 NOTE — Telephone Encounter (Signed)
Called pt and scheduled a transfer of care appt for 01/28/18

## 2017-11-27 NOTE — Telephone Encounter (Signed)
That is okay, schedule a visit after her earliest convenience

## 2017-11-27 NOTE — Telephone Encounter (Signed)
Okay to schedule NP appt at her convenience.  

## 2017-11-27 NOTE — Telephone Encounter (Signed)
Please advise 

## 2017-12-24 ENCOUNTER — Encounter: Payer: Self-pay | Admitting: Internal Medicine

## 2017-12-24 ENCOUNTER — Ambulatory Visit (INDEPENDENT_AMBULATORY_CARE_PROVIDER_SITE_OTHER): Payer: Medicare Other | Admitting: Internal Medicine

## 2017-12-24 VITALS — BP 122/70 | HR 82 | Temp 97.9°F | Wt 114.0 lb

## 2017-12-24 DIAGNOSIS — M353 Polymyalgia rheumatica: Secondary | ICD-10-CM

## 2017-12-24 DIAGNOSIS — J301 Allergic rhinitis due to pollen: Secondary | ICD-10-CM | POA: Diagnosis not present

## 2017-12-24 LAB — CBC WITH DIFFERENTIAL/PLATELET
BASOS PCT: 0.5 % (ref 0.0–3.0)
Basophils Absolute: 0 10*3/uL (ref 0.0–0.1)
EOS ABS: 0 10*3/uL (ref 0.0–0.7)
EOS PCT: 0.6 % (ref 0.0–5.0)
HCT: 38 % (ref 36.0–46.0)
Hemoglobin: 12.6 g/dL (ref 12.0–15.0)
LYMPHS ABS: 1.4 10*3/uL (ref 0.7–4.0)
Lymphocytes Relative: 25.7 % (ref 12.0–46.0)
MCHC: 33.2 g/dL (ref 30.0–36.0)
MCV: 94.3 fl (ref 78.0–100.0)
MONO ABS: 0.4 10*3/uL (ref 0.1–1.0)
Monocytes Relative: 7.6 % (ref 3.0–12.0)
NEUTROS ABS: 3.6 10*3/uL (ref 1.4–7.7)
Neutrophils Relative %: 65.6 % (ref 43.0–77.0)
PLATELETS: 236 10*3/uL (ref 150.0–400.0)
RBC: 4.03 Mil/uL (ref 3.87–5.11)
RDW: 13.7 % (ref 11.5–15.5)
WBC: 5.5 10*3/uL (ref 4.0–10.5)

## 2017-12-24 LAB — COMPREHENSIVE METABOLIC PANEL
ALT: 15 U/L (ref 0–35)
AST: 20 U/L (ref 0–37)
Albumin: 4.1 g/dL (ref 3.5–5.2)
Alkaline Phosphatase: 59 U/L (ref 39–117)
BUN: 23 mg/dL (ref 6–23)
CHLORIDE: 102 meq/L (ref 96–112)
CO2: 30 meq/L (ref 19–32)
Calcium: 9.6 mg/dL (ref 8.4–10.5)
Creatinine, Ser: 0.66 mg/dL (ref 0.40–1.20)
GFR: 91.71 mL/min (ref >60.00)
Glucose, Bld: 90 mg/dL (ref 70–99)
POTASSIUM: 4.2 meq/L (ref 3.5–5.1)
SODIUM: 139 meq/L (ref 135–145)
Total Bilirubin: 0.6 mg/dL (ref 0.2–1.2)
Total Protein: 6.6 g/dL (ref 6.0–8.3)

## 2017-12-24 NOTE — Patient Instructions (Signed)
Follow-up with Dr. Larose Kells  as scheduled

## 2017-12-24 NOTE — Progress Notes (Signed)
Subjective:    Patient ID: Savannah Jimenez, female    DOB: 21-May-1936, 82 y.o.   MRN: [id number]  HPI 82 year old patient who is seen today for her biannual follow-up.  She does quite well does have remote history of PMR that has been stable.  She has been off prednisone therapy for some time.  She does have some osteoarthritis and does take meloxicam low-dose almost daily.  She has allergic rhinitis.    She is scheduled for follow-up with her new PCP next month    Past Medical History:  Diagnosis Date  . Allergy   . Arthritis   . GERD (gastroesophageal reflux disease)   . HOH (hard of hearing)   . Osteoporosis      Social History   Socioeconomic History  . Marital status: Married    Spouse name: Not on file  . Number of children: Not on file  . Years of education: Not on file  . Highest education level: Not on file  Occupational History  . Not on file  Social Needs  . Financial resource strain: Not on file  . Food insecurity:    Worry: Not on file    Inability: Not on file  . Transportation needs:    Medical: Not on file    Non-medical: Not on file  Tobacco Use  . Smoking status: Never Smoker  . Smokeless tobacco: Never Used  Substance and Sexual Activity  . Alcohol use: Yes    Comment: rarely  . Drug use: No  . Sexual activity: Not on file  Lifestyle  . Physical activity:    Days per week: Not on file    Minutes per session: Not on file  . Stress: Not on file  Relationships  . Social connections:    Talks on phone: Not on file    Gets together: Not on file    Attends religious service: Not on file    Active member of club or organization: Not on file    Attends meetings of clubs or organizations: Not on file    Relationship status: Not on file  . Intimate partner violence:    Fear of current or ex partner: Not on file    Emotionally abused: Not on file    Physically abused: Not on file    Forced sexual activity: Not on file  Other Topics Concern  .  Not on file  Social History Narrative  . Not on file    Past Surgical History:  Procedure Laterality Date  . ABDOMINAL HYSTERECTOMY    . ARTERY BIOPSY Right 10/06/2014   Procedure: BIOPSY TEMPORAL ARTERY;  Surgeon: Armandina Gemma, MD;  Location: [place];  Service: General;  Laterality: Right;  . BREAST EXCISIONAL BIOPSY Right    over 20 years ago; lymph nodes removed  . CATARACT EXTRACTION  10/2011   Left  . colonscopy  2007  . INNER EAR SURGERY  1974   both stapes replaced-steal  . TUBAL LIGATION      Family History  Problem Relation Age of Onset  . Heart failure Mother   . Hypertension Sister   . Asthma Sister   . Hypertension Brother     Allergies  Allergen Reactions  . Tetanus Toxoids     Arm swelling   . Penicillins   . Sulfacetamide Sodium     Current Outpatient Medications on File Prior to Visit  Medication Sig Dispense Refill  . calcium citrate-vitamin D (CITRACAL+D) 315-200  MG-UNIT tablet Take 1 tablet by mouth daily.    . fish oil-omega-3 fatty acids 1000 MG capsule Take 2 g by mouth daily.      . fluticasone (FLONASE) 50 MCG/ACT nasal spray SHAKE WELL AND USE 2 SPRAYS IN EACH NOSTRIL DAILY 16 g 3  . Melatonin 3 MG TABS Take 1 tablet by mouth at bedtime.    . meloxicam (MOBIC) 7.5 MG tablet Take 1 tablet (7.5 mg total) by mouth daily. 90 tablet 3  . zolpidem (AMBIEN) 5 MG tablet Take 1 tablet (5 mg total) by mouth at bedtime as needed. 30 tablet 2  . Multiple Vitamins-Minerals (PRESERVISION AREDS 2 PO) Take 1 tablet by mouth 3 (three) times a week.     . Multiple Vitamins-Minerals (VISION-VITE PRESERVE PO) Take 2 tablets by mouth daily.     No current facility-administered medications on file prior to visit.     BP 122/70 (BP Location: Right Arm, Patient Position: Sitting, Cuff Size: Normal)   Pulse 82   Temp 97.9 F (36.6 C) (Oral)   Wt 114 lb (51.7 kg)   SpO2 97%   BMI 20.52 kg/m      Review of Systems  Constitutional: Negative.    HENT: Negative for congestion, dental problem, hearing loss, rhinorrhea, sinus pressure, sore throat and tinnitus.   Eyes: Negative for pain, discharge and visual disturbance.  Respiratory: Negative for cough and shortness of breath.   Cardiovascular: Negative for chest pain, palpitations and leg swelling.  Gastrointestinal: Negative for abdominal distention, abdominal pain, blood in stool, constipation, diarrhea, nausea and vomiting.  Genitourinary: Negative for difficulty urinating, dysuria, flank pain, frequency, hematuria, pelvic pain, urgency, vaginal bleeding, vaginal discharge and vaginal pain.  Musculoskeletal: Positive for arthralgias. Negative for gait problem and joint swelling.  Skin: Negative for rash.  Neurological: Negative for dizziness, syncope, speech difficulty, weakness, numbness and headaches.  Hematological: Negative for adenopathy.  Psychiatric/Behavioral: Positive for sleep disturbance. Negative for agitation, behavioral problems and dysphoric mood. The patient is not nervous/anxious.        Objective:   Physical Exam  Constitutional: She is oriented to person, place, and time. She appears well-developed and well-nourished.  Blood pressure low normal  HENT:  Head: Normocephalic.  Right Ear: External ear normal.  Left Ear: External ear normal.  Mouth/Throat: Oropharynx is clear and moist.  Eyes: Pupils are equal, round, and reactive to light. Conjunctivae and EOM are normal.  Neck: Normal range of motion. Neck supple. No thyromegaly present.  Cardiovascular: Normal rate, regular rhythm, normal heart sounds and intact distal pulses.  Pedal pulses full  Pulmonary/Chest: Effort normal and breath sounds normal.  Abdominal: Soft. Bowel sounds are normal. She exhibits no mass. There is no tenderness.  Musculoskeletal: Normal range of motion.  Lymphadenopathy:    She has no cervical adenopathy.  Neurological: She is alert and oriented to person, place, and time.    Skin: Skin is warm and dry. No rash noted.  Psychiatric: She has a normal mood and affect. Her behavior is normal.          Assessment & Plan:   PMR.  Stable Osteoarthritis/chronic NSAID use.  Will check CBC and Cmet History of allergic rhinitis  Follow-up PCP as scheduled   Nyoka Cowden

## 2018-01-07 ENCOUNTER — Encounter: Payer: Self-pay | Admitting: Internal Medicine

## 2018-01-07 DIAGNOSIS — H43823 Vitreomacular adhesion, bilateral: Secondary | ICD-10-CM | POA: Diagnosis not present

## 2018-01-07 DIAGNOSIS — H353132 Nonexudative age-related macular degeneration, bilateral, intermediate dry stage: Secondary | ICD-10-CM | POA: Diagnosis not present

## 2018-01-07 DIAGNOSIS — H04123 Dry eye syndrome of bilateral lacrimal glands: Secondary | ICD-10-CM | POA: Diagnosis not present

## 2018-01-07 DIAGNOSIS — H40013 Open angle with borderline findings, low risk, bilateral: Secondary | ICD-10-CM | POA: Diagnosis not present

## 2018-01-07 LAB — HM DIABETES EYE EXAM

## 2018-01-28 ENCOUNTER — Encounter: Payer: Self-pay | Admitting: Internal Medicine

## 2018-01-28 ENCOUNTER — Ambulatory Visit (INDEPENDENT_AMBULATORY_CARE_PROVIDER_SITE_OTHER): Payer: Medicare Other | Admitting: Internal Medicine

## 2018-01-28 VITALS — BP 122/74 | HR 94 | Temp 97.9°F | Resp 16 | Ht 63.0 in | Wt 114.0 lb

## 2018-01-28 DIAGNOSIS — R5383 Other fatigue: Secondary | ICD-10-CM | POA: Diagnosis not present

## 2018-01-28 DIAGNOSIS — M353 Polymyalgia rheumatica: Secondary | ICD-10-CM | POA: Diagnosis not present

## 2018-01-28 DIAGNOSIS — M818 Other osteoporosis without current pathological fracture: Secondary | ICD-10-CM | POA: Diagnosis not present

## 2018-01-28 NOTE — Progress Notes (Signed)
Subjective:    Patient ID: Savannah Jimenez, female    DOB: 10/04/1935, 82 y.o.   MRN: [id number]  DOS:  01/28/2018 Type of visit - description : New patient, transferring from  Dr. Burnice Logan Interval history: In general feels well. She did report 6 months history of on and off fatigue, described as simply lack of energy, has good days and bad days. History of PMR, currently has some pain at both biceps but no generalized aches.  Takes meloxicam 7.5 mg half tablet daily. Insomnia: Currently well controlled Osteopenia: Not taking vitamin D.   Review of Systems  Denies fever chills.  No headache or weight loss No visual disturbances She is active, goes to the gym 3 times a week, walks in the treadmill, denies chest pain, SOB, DOE.  No palpitations or lower extremity edema No anxiety or depression Past Medical History:  Diagnosis Date  . Allergy   . Arthritis   . GERD (gastroesophageal reflux disease)   . HOH (hard of hearing)   . Osteoporosis     Past Surgical History:  Procedure Laterality Date  . ABDOMINAL HYSTERECTOMY     still have cervix  . ARTERY BIOPSY Right 10/06/2014   Procedure: BIOPSY TEMPORAL ARTERY;  Surgeon: Armandina Gemma, MD;  Location: [place];  Service: General;  Laterality: Right;  . BREAST EXCISIONAL BIOPSY Right    over 20 years ago; lymph nodes removed  . CATARACT EXTRACTION  10/2011   Left  . colonscopy  2007  . INNER EAR SURGERY  1974   both stapes replaced-steal  . TUBAL LIGATION      Social History   Socioeconomic History  . Marital status: Married    Spouse name: Not on file  . Number of children: 2  . Years of education: Not on file  . Highest education level: Not on file  Occupational History  . Occupation: retired , day care  Social Needs  . Financial resource strain: Not on file  . Food insecurity:    Worry: Not on file    Inability: Not on file  . Transportation needs:    Medical: Not on file    Non-medical: Not  on file  Tobacco Use  . Smoking status: Never Smoker  . Smokeless tobacco: Never Used  Substance and Sexual Activity  . Alcohol use: Yes    Comment: rarely  . Drug use: No  . Sexual activity: Not on file  Lifestyle  . Physical activity:    Days per week: Not on file    Minutes per session: Not on file  . Stress: Not on file  Relationships  . Social connections:    Talks on phone: Not on file    Gets together: Not on file    Attends religious service: Not on file    Active member of club or organization: Not on file    Attends meetings of clubs or organizations: Not on file    Relationship status: Not on file  . Intimate partner violence:    Fear of current or ex partner: Not on file    Emotionally abused: Not on file    Physically abused: Not on file    Forced sexual activity: Not on file  Other Topics Concern  . Not on file  Social History Narrative   2 g- children   2 g-g- children      Allergies as of 01/28/2018      Reactions   Tetanus Toxoids  Arm swelling    Penicillins    Sulfacetamide Sodium       Medication List        Accurate as of 01/28/18 11:59 PM. Always use your most recent med list.          calcium citrate-vitamin D 315-200 MG-UNIT tablet Commonly known as:  CITRACAL+D Take 1 tablet by mouth daily.   fish oil-omega-3 fatty acids 1000 MG capsule Take 2 g by mouth daily.   fluticasone 50 MCG/ACT nasal spray Commonly known as:  FLONASE SHAKE WELL AND USE 2 SPRAYS IN EACH NOSTRIL DAILY   Melatonin 3 MG Tabs Take 1 tablet by mouth at bedtime.   meloxicam 7.5 MG tablet Commonly known as:  MOBIC Take 1 tablet (7.5 mg total) by mouth daily.   VISION-VITE PRESERVE PO Take 2 tablets by mouth daily.   PRESERVISION AREDS 2 PO Take 1 tablet by mouth 3 (three) times a week.   zolpidem 5 MG tablet Commonly known as:  AMBIEN Take 1 tablet (5 mg total) by mouth at bedtime as needed.          Objective:   Physical Exam BP 122/74 (BP  Location: Left Arm, Patient Position: Sitting, Cuff Size: Small)   Pulse 94   Temp 97.9 F (36.6 C) (Oral)   Resp 16   Ht 5\' 3"  (1.6 m)   Wt 114 lb (51.7 kg)   SpO2 91%   BMI 20.19 kg/m  General:   Well developed, NAD, see BMI.  HEENT:  Normocephalic . Face symmetric, atraumatic. Palpation of the T.A.  Bilaterally: Pulses present, no TTP Neck: Normal carotid pulses, no thyromegaly. Lungs:  CTA B Normal respiratory effort, no intercostal retractions, no accessory muscle use. Heart: RRR,  no murmur.  no pretibial edema bilaterally  Abdomen:  Not distended, soft, non-tender. No rebound or rigidity.   Skin: Not pale. Not jaundice Neurologic:  alert & oriented X3.  Speech normal, gait appropriate for age and unassisted Psych--  Cognition and judgment appear intact.  Cooperative with normal attention span and concentration.  Behavior appropriate. No anxious or depressed appearing.     Assessment & Plan:   Assessment, transferring from Dr. Burnice Logan Allergies GERD Osteoporosis -Tscore  -2.2 (10-2017) Insomnia H/o polymyalgia rheumatica; used to see Dr Charlestine Night, [facility]. Bx 2016  PLAN Osteoporosis: Last T score -2.2, recommend calcium and vitamin D. Insomnia: Well-controlled with melatonin nightly and occasional Ambien Fatigue: reports good and bad days, no red flag symptoms, EKG today: NSR, no change from previous Recent CMP, CBC are normal. Plan: T24, folic acid, vitamin D.  TSH. H/O PMR: currently  has pain in the biceps, check sed rate.  Continue with meloxicam half tablet daily as needed. RTC 4 months

## 2018-01-28 NOTE — Progress Notes (Signed)
Pre visit review using our clinic review tool, if applicable. No additional management support is needed unless otherwise documented below in the visit note. 

## 2018-01-28 NOTE — Patient Instructions (Signed)
GO TO THE LAB : Get the blood work     GO TO THE FRONT DESK Schedule your next appointment for a checkup in 4 months.   Calcium 1000 mg daily Vitamin D 3: 1000 units daily

## 2018-01-29 DIAGNOSIS — Z09 Encounter for follow-up examination after completed treatment for conditions other than malignant neoplasm: Secondary | ICD-10-CM | POA: Insufficient documentation

## 2018-01-29 LAB — TSH: TSH: 2.18 u[IU]/mL (ref 0.35–4.50)

## 2018-01-29 LAB — B12 AND FOLATE PANEL
FOLATE: 23.8 ng/mL (ref >5.9)
Vitamin B-12: 206 pg/mL — ABNORMAL LOW (ref 211–911)

## 2018-01-29 LAB — SEDIMENTATION RATE: SED RATE: 9 mm/h (ref 0–30)

## 2018-01-29 NOTE — Assessment & Plan Note (Signed)
Osteoporosis: Last T score -2.2, recommend calcium and vitamin D. Insomnia: Well-controlled with melatonin nightly and occasional Ambien Fatigue: reports good and bad days, no red flag symptoms, EKG today: NSR, no change from previous Recent CMP, CBC are normal. Plan: L87, folic acid, vitamin D.  TSH. H/O PMR: currently  has pain in the biceps, check sed rate.  Continue with meloxicam half tablet daily as needed. RTC 4 months

## 2018-01-30 LAB — VITAMIN D 1,25 DIHYDROXY
VITAMIN D 1, 25 (OH) TOTAL: 49 pg/mL (ref 18–72)
Vitamin D2 1, 25 (OH)2: <8 pg/mL
Vitamin D3 1, 25 (OH)2: 49 pg/mL

## 2018-02-01 ENCOUNTER — Encounter: Payer: Self-pay | Admitting: Internal Medicine

## 2018-03-28 DIAGNOSIS — L570 Actinic keratosis: Secondary | ICD-10-CM | POA: Diagnosis not present

## 2018-03-28 DIAGNOSIS — Z86018 Personal history of other benign neoplasm: Secondary | ICD-10-CM | POA: Diagnosis not present

## 2018-03-28 DIAGNOSIS — L821 Other seborrheic keratosis: Secondary | ICD-10-CM | POA: Diagnosis not present

## 2018-03-28 DIAGNOSIS — Z85828 Personal history of other malignant neoplasm of skin: Secondary | ICD-10-CM | POA: Diagnosis not present

## 2018-04-25 DIAGNOSIS — Z23 Encounter for immunization: Secondary | ICD-10-CM | POA: Diagnosis not present

## 2018-05-04 IMAGING — MG DIGITAL SCREENING BILATERAL MAMMOGRAM WITH TOMO AND CAD
8 series · 9 of 24 positions shown · non-contrast
Comparison: Previous exam(s).

CLINICAL DATA: Screening.

EXAM:
DIGITAL SCREENING BILATERAL MAMMOGRAM WITH TOMO AND CAD

[L CC synth-2D]
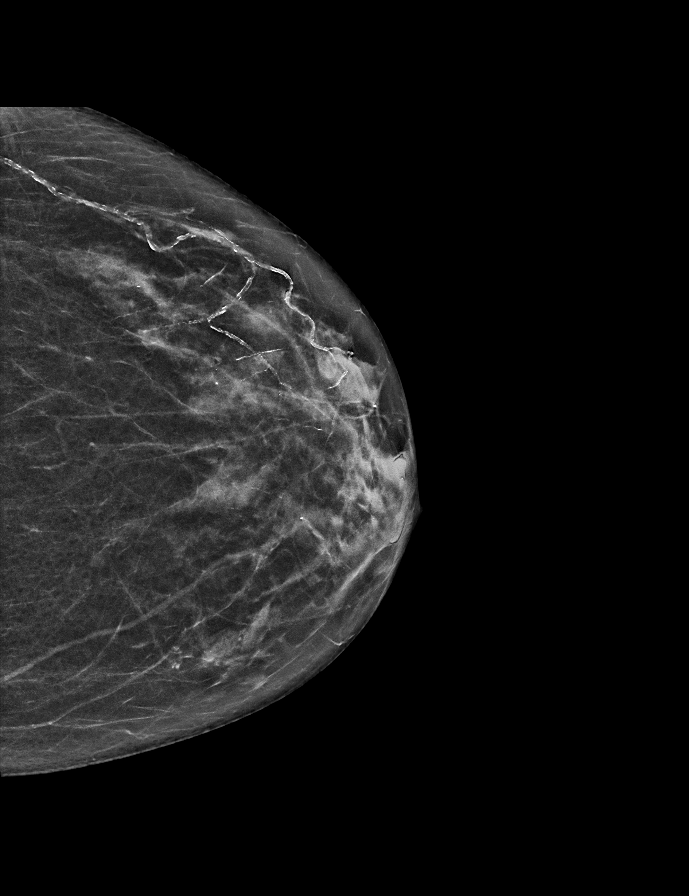

[R MLO synth-2D]
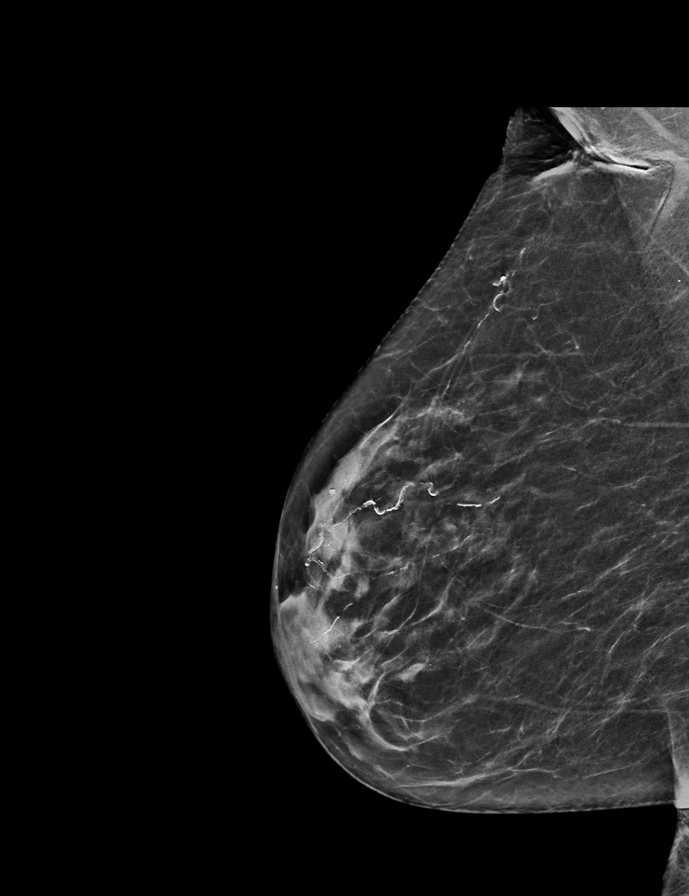

[L MLO synth-2D]
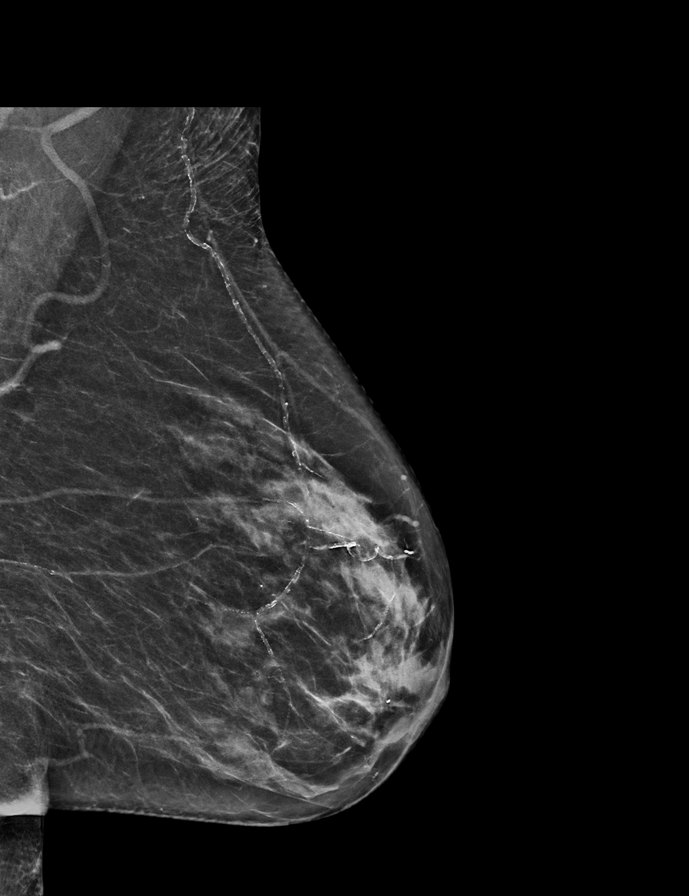

[R CC synth-2D]
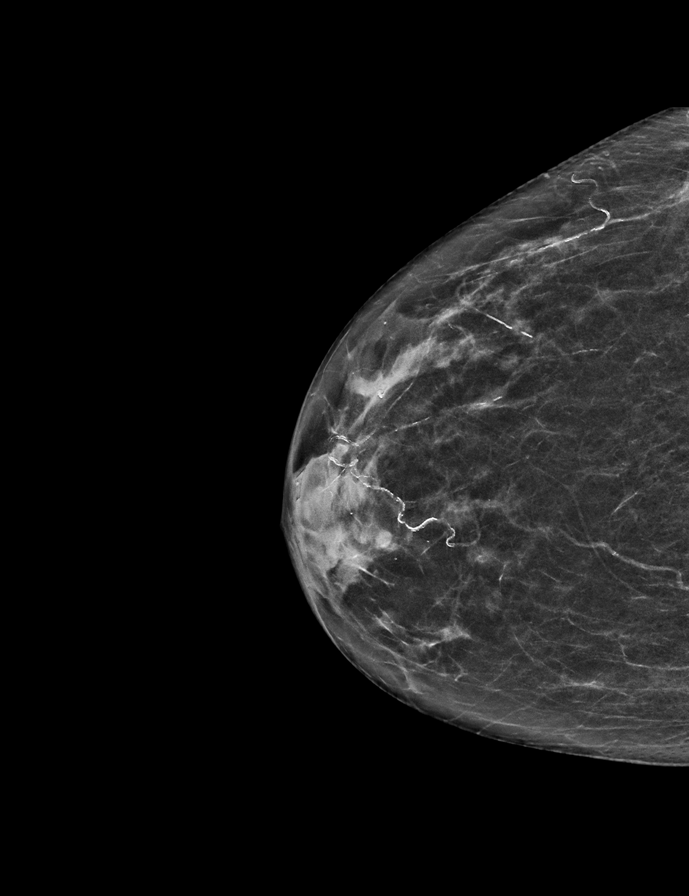

[L MLO tomo · 2 of 57 frames shown]
[frame 19/57]
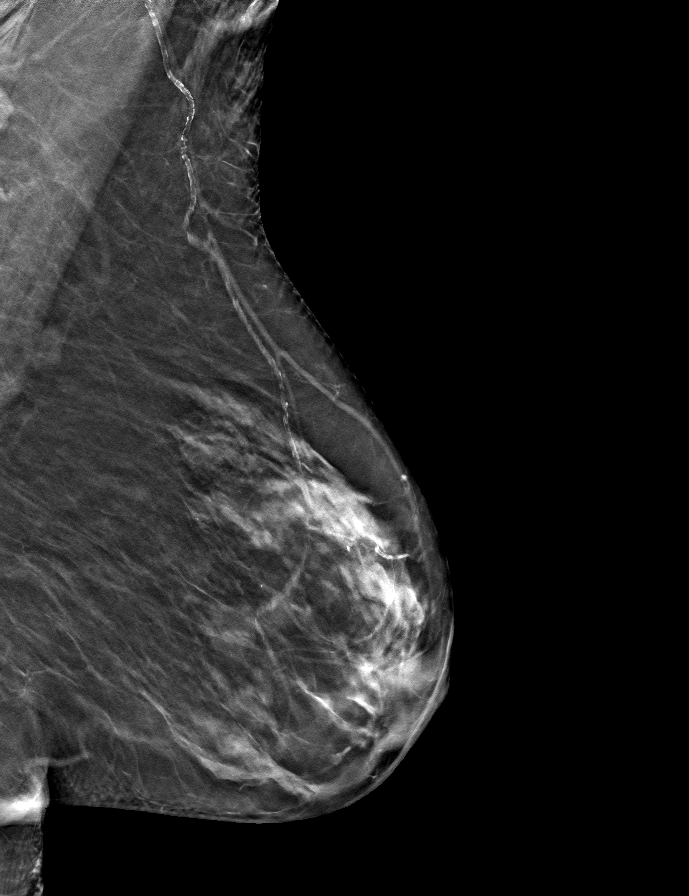
[frame 29/57]
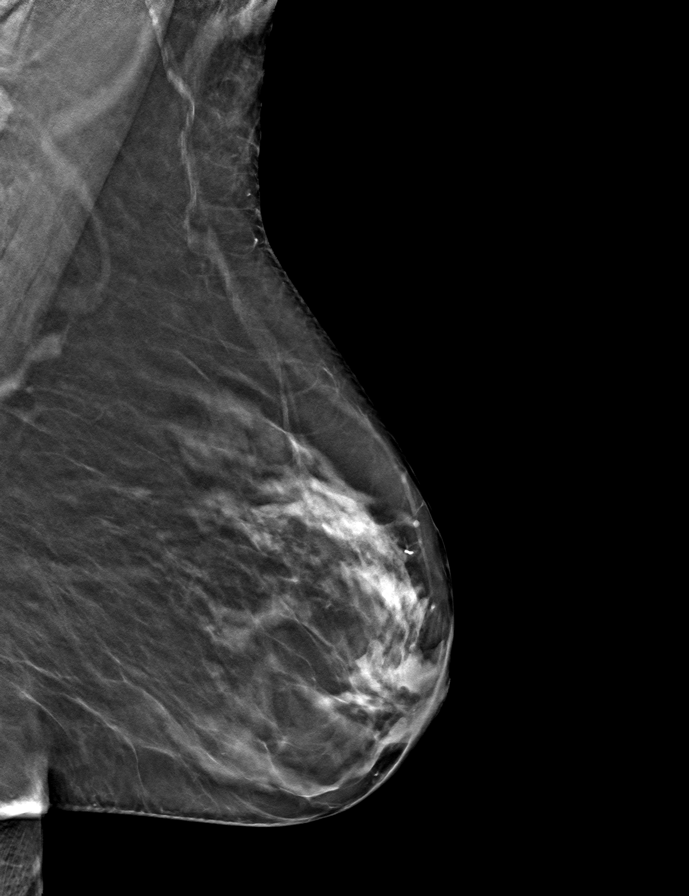

[R MLO tomo · tomo slice 28/55.0]
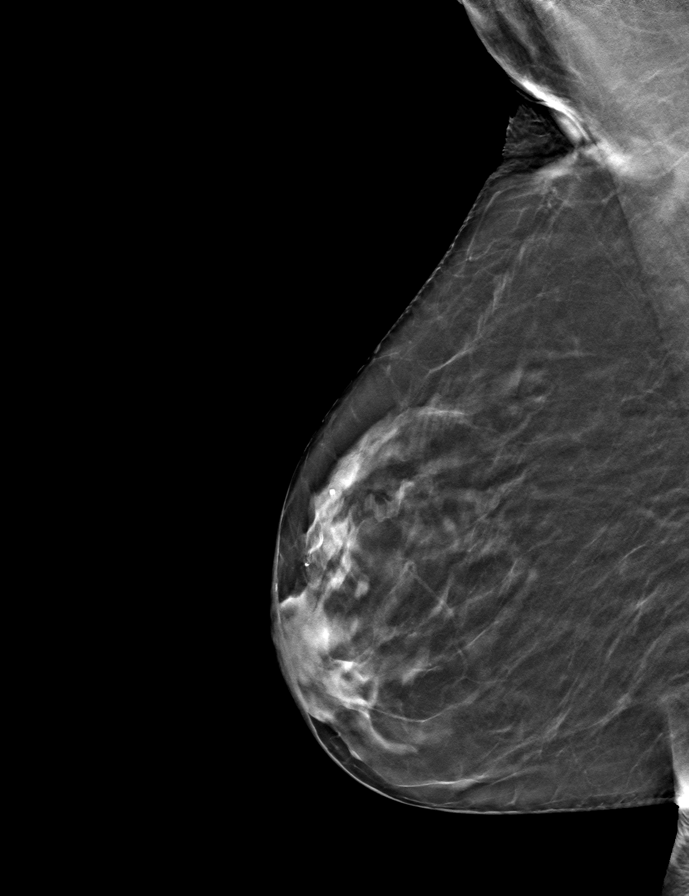

[R CC tomo · tomo slice 27/53.0]
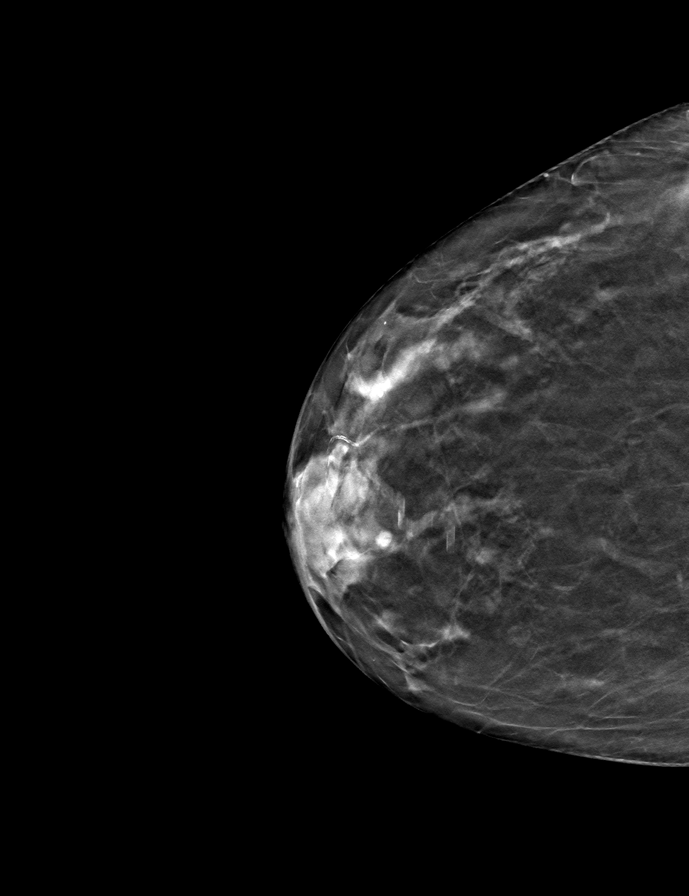

[L CC tomo · tomo slice 25/50.0]
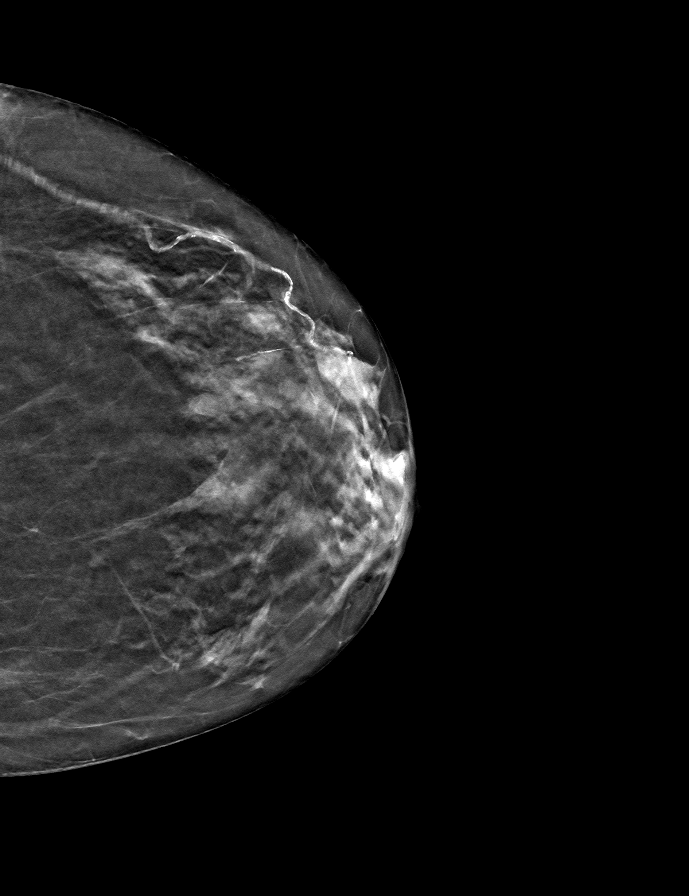

[9 of 24 positions shown; findings below may reference images not displayed]

ACR Breast Density Category c: The breast tissue is heterogeneously
dense, which may obscure small masses.
FINDINGS: There are no findings suspicious for malignancy. Images were
processed with CAD.
IMPRESSION: No mammographic evidence of malignancy. A result letter of this
screening mammogram will be mailed directly to the patient.

RECOMMENDATION:
Screening mammogram in one year. (Code:FT-U-LHB)

BI-RADS CATEGORY  1: Negative.

## 2018-05-29 ENCOUNTER — Ambulatory Visit: Payer: Medicare Other | Admitting: Internal Medicine

## 2018-06-03 ENCOUNTER — Encounter: Payer: Self-pay | Admitting: Internal Medicine

## 2018-06-03 ENCOUNTER — Ambulatory Visit (INDEPENDENT_AMBULATORY_CARE_PROVIDER_SITE_OTHER): Payer: Medicare Other | Admitting: Internal Medicine

## 2018-06-03 VITALS — BP 126/66 | HR 79 | Temp 97.9°F | Resp 16 | Ht 63.0 in | Wt 115.0 lb

## 2018-06-03 DIAGNOSIS — E538 Deficiency of other specified B group vitamins: Secondary | ICD-10-CM | POA: Diagnosis not present

## 2018-06-03 DIAGNOSIS — R5383 Other fatigue: Secondary | ICD-10-CM

## 2018-06-03 NOTE — Patient Instructions (Signed)
  GO TO THE FRONT DESK Schedule your next appointment for a  General check up in 3-4 months  Tylenol  500 mg OTC 2 tabs a day every 8 hours as needed for pain   think about [facility]

## 2018-06-03 NOTE — Progress Notes (Signed)
Pre visit review using our clinic review tool, if applicable. No additional management support is needed unless otherwise documented below in the visit note. 

## 2018-06-03 NOTE — Progress Notes (Signed)
Subjective:    Patient ID: Savannah Jimenez, female    DOB: [birth year], 82 y.o.   MRN: [id number]  DOS:  06/03/2018 Type of visit - description :  F/u  Interval history: Here to go over the last visit. Labs were unremarkable except for a low B12. She is taking supplements and she did notice a positive change shortly after she started them. She has shoulder muscles aches and pains, she held meloxicam and the pain was not better or worse so she stopped NSAIDs.  Other concerns: Report chronic hoarseness for several years, on and off, wonders if ENT referral is necessary We review her previous immunizations and prevention preventive care  Review of Systems Again, she denies fever, chills or headaches  Past Medical History:  Diagnosis Date  . Allergy   . Arthritis   . GERD (gastroesophageal reflux disease)   . HOH (hard of hearing)   . Osteoporosis     Past Surgical History:  Procedure Laterality Date  . ABDOMINAL HYSTERECTOMY     still have cervix  . ARTERY BIOPSY Right 10/06/2014   Procedure: BIOPSY TEMPORAL ARTERY;  Surgeon: Armandina Gemma, MD;  Location: [place];  Service: General;  Laterality: Right;  . BREAST EXCISIONAL BIOPSY Right    over 20 years ago; lymph nodes removed  . CATARACT EXTRACTION  10/2011   Left  . colonscopy  2007  . INNER EAR SURGERY  1974   both stapes replaced-steal  . TUBAL LIGATION      Social History   Socioeconomic History  . Marital status: Married    Spouse name: Not on file  . Number of children: 2  . Years of education: Not on file  . Highest education level: Not on file  Occupational History  . Occupation: retired , day care  Social Needs  . Financial resource strain: Not on file  . Food insecurity:    Worry: Not on file    Inability: Not on file  . Transportation needs:    Medical: Not on file    Non-medical: Not on file  Tobacco Use  . Smoking status: Never Smoker  . Smokeless tobacco: Never Used  Substance  and Sexual Activity  . Alcohol use: Yes    Comment: rarely  . Drug use: No  . Sexual activity: Not on file  Lifestyle  . Physical activity:    Days per week: Not on file    Minutes per session: Not on file  . Stress: Not on file  Relationships  . Social connections:    Talks on phone: Not on file    Gets together: Not on file    Attends religious service: Not on file    Active member of club or organization: Not on file    Attends meetings of clubs or organizations: Not on file    Relationship status: Not on file  . Intimate partner violence:    Fear of current or ex partner: Not on file    Emotionally abused: Not on file    Physically abused: Not on file    Forced sexual activity: Not on file  Other Topics Concern  . Not on file  Social History Narrative   2 g- children   2 g-g- children      Allergies as of 06/03/2018      Reactions   Tetanus Toxoids    Arm swelling    Penicillins    Sulfacetamide Sodium  Medication List        Accurate as of 06/03/18 11:59 PM. Always use your most recent med list.          B-12 1000 MCG Tabs Take by mouth.   calcium citrate-vitamin D 315-200 MG-UNIT tablet Commonly known as:  CITRACAL+D Take 1 tablet by mouth daily.   fish oil-omega-3 fatty acids 1000 MG capsule Take 2 g by mouth daily.   fluticasone 50 MCG/ACT nasal spray Commonly known as:  FLONASE SHAKE WELL AND USE 2 SPRAYS IN EACH NOSTRIL DAILY   Melatonin 3 MG Tabs Take 1 tablet by mouth at bedtime.   VISION-VITE PRESERVE PO Take 2 tablets by mouth daily.   zolpidem 5 MG tablet Commonly known as:  AMBIEN Take 1 tablet (5 mg total) by mouth at bedtime as needed.          Objective:   Physical Exam BP 126/66 (BP Location: Right Arm, Patient Position: Sitting, Cuff Size: Small)   Pulse 79   Temp 97.9 F (36.6 C) (Oral)   Resp 16   Ht 5\' 3"  (1.6 m)   Wt 115 lb (52.2 kg)   SpO2 97%   BMI 20.37 kg/m  General:   Well developed, NAD, BMI  noted. HEENT:  Normocephalic . Face symmetric, atraumatic Neck: No thyromegaly Lungs:  CTA B Normal respiratory effort, no intercostal retractions, no accessory muscle use. Heart: RRR,  no murmur.  No pretibial edema bilaterally  Skin: Not pale. Not jaundice Neurologic:  alert & oriented X3.  Speech normal, gait appropriate for age and unassisted Psych--  Cognition and judgment appear intact.  Cooperative with normal attention span and concentration.  Behavior appropriate. No anxious or depressed appearing.      Assessment & Plan:   Assessment, transferring from Dr. Burnice Logan (01/2018)  Allergies GERD Osteoporosis -Tscore  -2.2 (10-2017) Insomnia H/o polymyalgia rheumatica; used to see Dr Charlestine Night, [facility]ota. Bx 2016  PLAN Fatigue: See last visit, labs were okay except for B12 deficiency, she is now on supplements by mouth and feeling better. B12 deficiency: On supplements, will check levels at the next opportunity History of PMR: See last visit, she complained of biceps pain, sed rate was negative. She continue with pain in her bicep however she realized the meloxicam is not doing anything for her and she stopped it. At this point rec  to take Tylenol as needed, no more NSAIDs, call if she ever have more symptoms suggestive of PMR including generalized aches/fever/headaches. Upper extremity, biceps pain: See above Preventive care: reviewed.  RTC 3-4 month , general check up

## 2018-06-05 ENCOUNTER — Encounter: Payer: Self-pay | Admitting: Internal Medicine

## 2018-06-05 DIAGNOSIS — Z0000 Encounter for general adult medical examination without abnormal findings: Secondary | ICD-10-CM | POA: Insufficient documentation

## 2018-06-05 DIAGNOSIS — E538 Deficiency of other specified B group vitamins: Secondary | ICD-10-CM | POA: Insufficient documentation

## 2018-06-05 NOTE — Assessment & Plan Note (Signed)
Fatigue: See last visit, labs were okay except for B12 deficiency, she is now on supplements by mouth and feeling better. B12 deficiency: On supplements, will check levels at the next opportunity History of PMR: See last visit, she complained of biceps pain, sed rate was negative. She continue with pain in her bicep however she realized the meloxicam is not doing anything for her and she stopped it. At this point rec  to take Tylenol as needed, no more NSAIDs, call if she ever have more symptoms suggestive of PMR including generalized aches/fever/headaches. Upper extremity, biceps pain: See above Preventive care: reviewed.  RTC 3-4 month , general check up

## 2018-06-05 NOTE — Assessment & Plan Note (Signed)
Preventive care reviewed  Td: allergic; PNM 23:  2010, PNM 13:  2015.  s/pt remote Zostavax.  We discussed Shingrix. Had a flu shot  Last colonoscopy 2007, negative Cologuard 06/2017 Last mammogram 10/2017 No further Pap smears, last seen 2008.  She had a partial hysterectomy, still has a cervix.

## 2018-06-13 DIAGNOSIS — H40013 Open angle with borderline findings, low risk, bilateral: Secondary | ICD-10-CM | POA: Diagnosis not present

## 2018-07-02 ENCOUNTER — Ambulatory Visit: Payer: Medicare Other

## 2018-07-03 NOTE — Progress Notes (Addendum)
Sleeps about 6 Subjective:   Savannah Jimenez is a 82 y.o. female who presents for Medicare Annual (Subsequent) preventive examination.  Review of Systems: No ROS.  Medicare Wellness Visit. Additional risk factors are reflected in the social history. Cardiac Risk Factors include: advanced age (>76men, >106 women) Sleep patterns:  Takes  Melatonin 3 mg. Sleeps 6 hrs. Home Safety/Smoke Alarms: Feels safe in home. Smoke alarms in place. Lives with husband in 1 story home. Step over tub with grab rails.   Female:       Mammo- utd      Dexa scan- utd       CCS- Cologuard 07/12/17- negative    Objective:     Vitals: BP 140/64 (BP Location: Left Arm, Patient Position: Sitting, Cuff Size: Normal)   Pulse 77   Ht 5\' 3"  (1.6 m)   Wt 116 lb (52.6 kg)   SpO2 97%   BMI 20.55 kg/m   Body mass index is 20.55 kg/m.  Advanced Directives 07/04/2018 10/06/2014 10/02/2014  Does Patient Have a Medical Advance Directive? Yes Yes Yes  Type of Paramedic of [place];Living will - [place];Living will  Does patient want to make changes to medical advance directive? - - No - Patient declined  Copy of [place] in Chart? Yes - validated most recent copy scanned in chart (See row information) No - copy requested -    Tobacco Social History   Tobacco Use  Smoking Status Never Smoker  Smokeless Tobacco Never Used     Counseling given: Not Answered   Clinical Intake: Pain : No/denies pain    Past Medical History:  Diagnosis Date  . Allergy   . Arthritis   . GERD (gastroesophageal reflux disease)   . HOH (hard of hearing)   . Osteoporosis    Past Surgical History:  Procedure Laterality Date  . ABDOMINAL HYSTERECTOMY     still have cervix  . ARTERY BIOPSY Right 10/06/2014   Procedure: BIOPSY TEMPORAL ARTERY;  Surgeon: Armandina Gemma, MD;  Location: [facility];  Service: General;  Laterality: Right;  . BREAST  EXCISIONAL BIOPSY Right    over 20 years ago; lymph nodes removed  . CATARACT EXTRACTION  10/2011   Left  . colonscopy  2007  . INNER EAR SURGERY  1974   both stapes replaced-steal  . TUBAL LIGATION     Family History  Problem Relation Age of Onset  . Heart failure Mother   . Hypertension Sister   . Asthma Sister   . Hypertension Brother    Social History   Socioeconomic History  . Marital status: Married    Spouse name: Not on file  . Number of children: 2  . Years of education: Not on file  . Highest education level: Not on file  Occupational History  . Occupation: retired , day care  Social Needs  . Financial resource strain: Not on file  . Food insecurity:    Worry: Not on file    Inability: Not on file  . Transportation needs:    Medical: Not on file    Non-medical: Not on file  Tobacco Use  . Smoking status: Never Smoker  . Smokeless tobacco: Never Used  Substance and Sexual Activity  . Alcohol use: Yes    Comment: rarely  . Drug use: No  . Sexual activity: Not on file  Lifestyle  . Physical activity:    Days per  week: Not on file    Minutes per session: Not on file  . Stress: Not on file  Relationships  . Social connections:    Talks on phone: Not on file    Gets together: Not on file    Attends religious service: Not on file    Active member of club or organization: Not on file    Attends meetings of clubs or organizations: Not on file    Relationship status: Not on file  Other Topics Concern  . Not on file  Social History Narrative   2 g- children   2 g-g- children    Outpatient Encounter Medications as of 07/04/2018  Medication Sig  . calcium citrate-vitamin D (CITRACAL+D) 315-200 MG-UNIT tablet Take 1 tablet by mouth daily.  . Cyanocobalamin (B-12) 1000 MCG TABS Take by mouth.  . fish oil-omega-3 fatty acids 1000 MG capsule Take 2 g by mouth daily.    . fluticasone (FLONASE) 50 MCG/ACT nasal spray SHAKE WELL AND USE 2 SPRAYS IN EACH NOSTRIL  DAILY (Patient taking differently: SHAKE WELL, USE 2 SPRAYS EACH NOSTRIL DAILY AS NEEDED)  . Melatonin 3 MG TABS Take 1 tablet by mouth at bedtime.  . Multiple Vitamins-Minerals (VISION-VITE PRESERVE PO) Take 2 tablets by mouth daily.  Marland Kitchen zolpidem (AMBIEN) 5 MG tablet Take 1 tablet (5 mg total) by mouth at bedtime as needed.   No facility-administered encounter medications on file as of 07/04/2018.     Activities of Daily Living In your present state of health, do you have any difficulty performing the following activities: 07/04/2018 01/28/2018  Hearing? Y Y  Comment wearing hearing aids. Has hearing aids  Vision? N N  Comment hx cataract sx. -  Difficulty concentrating or making decisions? N N  Comment does suduka and Jumble and reads on kindle. -  Walking or climbing stairs? N N  Dressing or bathing? N N  Doing errands, shopping? N N  Preparing Food and eating ? N -  Using the Toilet? N -  In the past six months, have you accidently leaked urine? N -  Do you have problems with loss of bowel control? N -  Managing your Medications? N -  Managing your Finances? N -  Housekeeping or managing your Housekeeping? N -  Some recent data might be hidden    Patient Care Team: Colon Branch, MD as PCP - General (Internal Medicine) Hayden Pedro, MD as Consulting Physician (Ophthalmology)    Assessment:   This is a routine wellness examination for Savannah Jimenez. Physical assessment deferred to PCP.  Exercise Activities and Dietary recommendations Current Exercise Habits: Structured exercise class([facility]), Type of exercise: walking;strength training/weights, Time (Minutes): 45, Frequency (Times/Week): 3, Weekly Exercise (Minutes/Week): 135, Intensity: Mild, Exercise limited by: None identified Diet (meal preparation, eat out, water intake, caffeinated beverages, dairy products, fruits and vegetables): 24 hr recall Breakfast: bagel and coffee Lunch: chicken pie and broccoli with  cheese Dinner: steak, potatoes, greens Reports she drinksf water.   Goals    . Patient Stated     Maintain current health and independence.        Fall Risk Fall Risk  07/04/2018 01/28/2018 11/22/2016 06/23/2016 06/21/2015  Falls in the past year? 0 No No No No   Depression Screen PHQ 2/9 Scores 07/04/2018 01/28/2018 11/22/2016 06/23/2016  PHQ - 2 Score 0 0 0 0     Cognitive Function Ad8 score reviewed for issues:  Issues making decisions:no  Less interest in  hobbies / activities:no  Repeats questions, stories (family complaining):no  Trouble using ordinary gadgets (microwave, computer, phone):no  Forgets the month or year: no  Mismanaging finances: no  Remembering appts:no  Daily problems with thinking and/or memory:no Ad8 score is=0         Immunization History  Administered Date(s) Administered  . Influenza Whole 04/05/2010  . Influenza, High Dose Seasonal PF 04/30/2014, 05/04/2015, 04/25/2016  . Influenza-Unspecified 05/01/2017, 04/25/2018  . Pneumococcal Conjugate-13 06/03/2014  . Pneumococcal Polysaccharide-23 04/19/2009  . Zoster 11/04/2007, 12/24/2009    Screening Tests Health Maintenance  Topic Date Due  . TETANUS/TDAP  05/30/2021  . INFLUENZA VACCINE  Completed  . DEXA SCAN  Completed  . PNA vac Low Risk Adult  Completed       Plan:    Please schedule your next medicare wellness visit with me in 1 yr.  Continue to eat heart healthy diet (full of fruits, vegetables, whole grains, lean protein, water--limit salt, fat, and sugar intake) and increase physical activity as tolerated.  Continue doing brain stimulating activities (puzzles, reading, adult coloring books, staying active) to keep memory sharp.      I have personally reviewed and noted the following in the patient's chart:   . Medical and social history . Use of alcohol, tobacco or illicit drugs  . Current medications and supplements . Functional ability and status . Nutritional  status . Physical activity . Advanced directives . List of other physicians . Hospitalizations, surgeries, and ER visits in previous 12 months . Vitals . Screenings to include cognitive, depression, and falls . Referrals and appointments  In addition, I have reviewed and discussed with patient certain preventive protocols, quality metrics, and best practice recommendations. A written personalized care plan for preventive services as well as general preventive health recommendations were provided to patient.     Naaman Plummer [place], [place]  07/04/2018   Kathlene November, MD

## 2018-07-04 ENCOUNTER — Encounter: Payer: Self-pay | Admitting: *Deleted

## 2018-07-04 ENCOUNTER — Ambulatory Visit (INDEPENDENT_AMBULATORY_CARE_PROVIDER_SITE_OTHER): Payer: Medicare Other | Admitting: *Deleted

## 2018-07-04 ENCOUNTER — Encounter: Payer: Medicare Other | Admitting: Internal Medicine

## 2018-07-04 VITALS — BP 140/64 | HR 77 | Ht 63.0 in | Wt 116.0 lb

## 2018-07-04 DIAGNOSIS — Z0000 Encounter for general adult medical examination without abnormal findings: Secondary | ICD-10-CM | POA: Diagnosis not present

## 2018-07-04 NOTE — Patient Instructions (Signed)
Please schedule your next medicare wellness visit with me in 1 yr.  Continue to eat heart healthy diet (full of fruits, vegetables, whole grains, lean protein, water--limit salt, fat, and sugar intake) and increase physical activity as tolerated.  Continue doing brain stimulating activities (puzzles, reading, adult coloring books, staying active) to keep memory sharp.      Savannah Jimenez , Thank you for taking time to come for your Medicare Wellness Visit. I appreciate your ongoing commitment to your health goals. Please review the following plan we discussed and let me know if I can assist you in the future.   These are the goals we discussed: Goals    . Patient Stated     Maintain current health and independence.        This is a list of the screening recommended for you and due dates:  Health Maintenance  Topic Date Due  . Tetanus Vaccine  05/30/2021  . Flu Shot  Completed  . DEXA scan (bone density measurement)  Completed  . Pneumonia vaccines  Completed    Health Maintenance for Postmenopausal Women Menopause is a normal process in which your reproductive ability comes to an end. This process happens gradually over a span of months to years, usually between the ages of 36 and 62. Menopause is complete when you have missed 12 consecutive menstrual periods. It is important to talk with your health care provider about some of the most common conditions that affect postmenopausal women, such as heart disease, cancer, and bone loss (osteoporosis). Adopting a healthy lifestyle and getting preventive care can help to promote your health and wellness. Those actions can also lower your chances of developing some of these common conditions. What should I know about menopause? During menopause, you may experience a number of symptoms, such as:  Moderate-to-severe hot flashes.  Night sweats.  Decrease in sex drive.  Mood swings.  Headaches.  Tiredness.  Irritability.  Memory  problems.  Insomnia.  Choosing to treat or not to treat menopausal changes is an individual decision that you make with your health care provider. What should I know about hormone replacement therapy and supplements? Hormone therapy products are effective for treating symptoms that are associated with menopause, such as hot flashes and night sweats. Hormone replacement carries certain risks, especially as you become older. If you are thinking about using estrogen or estrogen with progestin treatments, discuss the benefits and risks with your health care provider. What should I know about heart disease and stroke? Heart disease, heart attack, and stroke become more likely as you age. This may be due, in part, to the hormonal changes that your body experiences during menopause. These can affect how your body processes dietary fats, triglycerides, and cholesterol. Heart attack and stroke are both medical emergencies. There are many things that you can do to help prevent heart disease and stroke:  Have your blood pressure checked at least every 1-2 years. High blood pressure causes heart disease and increases the risk of stroke.  If you are 67-74 years old, ask your health care provider if you should take aspirin to prevent a heart attack or a stroke.  Do not use any tobacco products, including cigarettes, chewing tobacco, or electronic cigarettes. If you need help quitting, ask your health care provider.  It is important to eat a healthy diet and maintain a healthy weight. ? Be sure to include plenty of vegetables, fruits, low-fat dairy products, and lean protein. ? Avoid eating foods that  are high in solid fats, added sugars, or salt (sodium).  Get regular exercise. This is one of the most important things that you can do for your health. ? Try to exercise for at least 150 minutes each week. The type of exercise that you do should increase your heart rate and make you sweat. This is known as  moderate-intensity exercise. ? Try to do strengthening exercises at least twice each week. Do these in addition to the moderate-intensity exercise.  Know your numbers.Ask your health care provider to check your cholesterol and your blood glucose. Continue to have your blood tested as directed by your health care provider.  What should I know about cancer screening? There are several types of cancer. Take the following steps to reduce your risk and to catch any cancer development as early as possible. Breast Cancer  Practice breast self-awareness. ? This means understanding how your breasts normally appear and feel. ? It also means doing regular breast self-exams. Let your health care provider know about any changes, no matter how small.  If you are 40 or older, have a clinician do a breast exam (clinical breast exam or CBE) every year. Depending on your age, family history, and medical history, it may be recommended that you also have a yearly breast X-ray (mammogram).  If you have a family history of breast cancer, talk with your health care provider about genetic screening.  If you are at high risk for breast cancer, talk with your health care provider about having an MRI and a mammogram every year.  Breast cancer (BRCA) gene test is recommended for women who have family members with BRCA-related cancers. Results of the assessment will determine the need for genetic counseling and BRCA1 and for BRCA2 testing. BRCA-related cancers include these types: ? Breast. This occurs in males or females. ? Ovarian. ? Tubal. This may also be called fallopian tube cancer. ? Cancer of the abdominal or pelvic lining (peritoneal cancer). ? Prostate. ? Pancreatic.  Cervical, Uterine, and Ovarian Cancer Your health care provider may recommend that you be screened regularly for cancer of the pelvic organs. These include your ovaries, uterus, and vagina. This screening involves a pelvic exam, which  includes checking for microscopic changes to the surface of your cervix (Pap test).  For women ages 21-65, health care providers may recommend a pelvic exam and a Pap test every three years. For women ages 30-65, they may recommend the Pap test and pelvic exam, combined with testing for human papilloma virus (HPV), every five years. Some types of HPV increase your risk of cervical cancer. Testing for HPV may also be done on women of any age who have unclear Pap test results.  Other health care providers may not recommend any screening for nonpregnant women who are considered low risk for pelvic cancer and have no symptoms. Ask your health care provider if a screening pelvic exam is right for you.  If you have had past treatment for cervical cancer or a condition that could lead to cancer, you need Pap tests and screening for cancer for at least 20 years after your treatment. If Pap tests have been discontinued for you, your risk factors (such as having a new sexual partner) need to be reassessed to determine if you should start having screenings again. Some women have medical problems that increase the chance of getting cervical cancer. In these cases, your health care provider may recommend that you have screening and Pap tests more often.    If you have a family history of uterine cancer or ovarian cancer, talk with your health care provider about genetic screening.  If you have vaginal bleeding after reaching menopause, tell your health care provider.  There are currently no reliable tests available to screen for ovarian cancer.  Lung Cancer Lung cancer screening is recommended for adults 55-80 years old who are at high risk for lung cancer because of a history of smoking. A yearly low-dose CT scan of the lungs is recommended if you:  Currently smoke.  Have a history of at least 30 pack-years of smoking and you currently smoke or have quit within the past 15 years. A pack-year is smoking an  average of one pack of cigarettes per day for one year.  Yearly screening should:  Continue until it has been 15 years since you quit.  Stop if you develop a health problem that would prevent you from having lung cancer treatment.  Colorectal Cancer  This type of cancer can be detected and can often be prevented.  Routine colorectal cancer screening usually begins at age 50 and continues through age 75.  If you have risk factors for colon cancer, your health care provider may recommend that you be screened at an earlier age.  If you have a family history of colorectal cancer, talk with your health care provider about genetic screening.  Your health care provider may also recommend using home test kits to check for hidden blood in your stool.  A small camera at the end of a tube can be used to examine your colon directly (sigmoidoscopy or colonoscopy). This is done to check for the earliest forms of colorectal cancer.  Direct examination of the colon should be repeated every 5-10 years until age 75. However, if early forms of precancerous polyps or small growths are found or if you have a family history or genetic risk for colorectal cancer, you may need to be screened more often.  Skin Cancer  Check your skin from head to toe regularly.  Monitor any moles. Be sure to tell your health care provider: ? About any new moles or changes in moles, especially if there is a change in a mole's shape or color. ? If you have a mole that is larger than the size of a pencil eraser.  If any of your family members has a history of skin cancer, especially at a young age, talk with your health care provider about genetic screening.  Always use sunscreen. Apply sunscreen liberally and repeatedly throughout the day.  Whenever you are outside, protect yourself by wearing long sleeves, pants, a wide-brimmed hat, and sunglasses.  What should I know about osteoporosis? Osteoporosis is a condition in  which bone destruction happens more quickly than new bone creation. After menopause, you may be at an increased risk for osteoporosis. To help prevent osteoporosis or the bone fractures that can happen because of osteoporosis, the following is recommended:  If you are 19-50 years old, get at least 1,000 mg of calcium and at least 600 mg of vitamin D per day.  If you are older than age 50 but younger than age 70, get at least 1,200 mg of calcium and at least 600 mg of vitamin D per day.  If you are older than age 70, get at least 1,200 mg of calcium and at least 800 mg of vitamin D per day.  Smoking and excessive alcohol intake increase the risk of osteoporosis. Eat foods that are rich   in calcium and vitamin D, and do weight-bearing exercises several times each week as directed by your health care provider. What should I know about how menopause affects my mental health? Depression may occur at any age, but it is more common as you become older. Common symptoms of depression include:  Low or sad mood.  Changes in sleep patterns.  Changes in appetite or eating patterns.  Feeling an overall lack of motivation or enjoyment of activities that you previously enjoyed.  Frequent crying spells.  Talk with your health care provider if you think that you are experiencing depression. What should I know about immunizations? It is important that you get and maintain your immunizations. These include:  Tetanus, diphtheria, and pertussis (Tdap) booster vaccine.  Influenza every year before the flu season begins.  Pneumonia vaccine.  Shingles vaccine.  Your health care provider may also recommend other immunizations. This information is not intended to replace advice given to you by your health care provider. Make sure you discuss any questions you have with your health care provider. Document Released: 09/01/2005 Document Revised: 01/28/2016 Document Reviewed: 04/13/2015 Elsevier Interactive  Patient Education  2018 [employer].

## 2018-07-05 ENCOUNTER — Other Ambulatory Visit: Payer: Self-pay | Admitting: Internal Medicine

## 2018-07-05 DIAGNOSIS — Z1231 Encounter for screening mammogram for malignant neoplasm of breast: Secondary | ICD-10-CM

## 2018-10-02 ENCOUNTER — Encounter: Payer: Self-pay | Admitting: Internal Medicine

## 2018-10-02 ENCOUNTER — Other Ambulatory Visit: Payer: Self-pay

## 2018-10-02 ENCOUNTER — Ambulatory Visit (INDEPENDENT_AMBULATORY_CARE_PROVIDER_SITE_OTHER): Payer: Medicare Other | Admitting: Internal Medicine

## 2018-10-02 ENCOUNTER — Ambulatory Visit: Payer: Medicare Other | Admitting: Internal Medicine

## 2018-10-02 VITALS — BP 122/70 | HR 82 | Temp 98.0°F | Resp 16 | Ht 63.0 in | Wt 115.5 lb

## 2018-10-02 DIAGNOSIS — R5383 Other fatigue: Secondary | ICD-10-CM

## 2018-10-02 DIAGNOSIS — Z136 Encounter for screening for cardiovascular disorders: Secondary | ICD-10-CM

## 2018-10-02 DIAGNOSIS — M545 Low back pain, unspecified: Secondary | ICD-10-CM

## 2018-10-02 DIAGNOSIS — R0989 Other specified symptoms and signs involving the circulatory and respiratory systems: Secondary | ICD-10-CM | POA: Diagnosis not present

## 2018-10-02 DIAGNOSIS — E538 Deficiency of other specified B group vitamins: Secondary | ICD-10-CM

## 2018-10-02 DIAGNOSIS — Z09 Encounter for follow-up examination after completed treatment for conditions other than malignant neoplasm: Secondary | ICD-10-CM

## 2018-10-02 DIAGNOSIS — M818 Other osteoporosis without current pathological fracture: Secondary | ICD-10-CM

## 2018-10-02 DIAGNOSIS — Z8739 Personal history of other diseases of the musculoskeletal system and connective tissue: Secondary | ICD-10-CM | POA: Diagnosis not present

## 2018-10-02 DIAGNOSIS — R1900 Intra-abdominal and pelvic swelling, mass and lump, unspecified site: Secondary | ICD-10-CM

## 2018-10-02 LAB — CBC WITH DIFFERENTIAL/PLATELET
BASOS PCT: 1.1 % (ref 0.0–3.0)
Basophils Absolute: 0.1 10*3/uL (ref 0.0–0.1)
EOS ABS: 0 10*3/uL (ref 0.0–0.7)
Eosinophils Relative: 0.3 % (ref 0.0–5.0)
HEMATOCRIT: 39 % (ref 36.0–46.0)
Hemoglobin: 12.9 g/dL (ref 12.0–15.0)
LYMPHS PCT: 28.6 % (ref 12.0–46.0)
Lymphs Abs: 1.5 10*3/uL (ref 0.7–4.0)
MCHC: 33.1 g/dL (ref 30.0–36.0)
MCV: 94.5 fl (ref 78.0–100.0)
MONOS PCT: 6.9 % (ref 3.0–12.0)
Monocytes Absolute: 0.4 10*3/uL (ref 0.1–1.0)
NEUTROS ABS: 3.3 10*3/uL (ref 1.4–7.7)
Neutrophils Relative %: 63.1 % (ref 43.0–77.0)
PLATELETS: 219 10*3/uL (ref 150.0–400.0)
RBC: 4.12 Mil/uL (ref 3.87–5.11)
RDW: 13.5 % (ref 11.5–15.5)
WBC: 5.3 10*3/uL (ref 4.0–10.5)

## 2018-10-02 LAB — BASIC METABOLIC PANEL
BUN: 29 mg/dL — AB (ref 6–23)
CHLORIDE: 101 meq/L (ref 96–112)
CO2: 30 mEq/L (ref 19–32)
CREATININE: 0.72 mg/dL (ref 0.40–1.20)
Calcium: 9.4 mg/dL (ref 8.4–10.5)
GFR: 77.89 mL/min (ref >60.00)
Glucose, Bld: 90 mg/dL (ref 70–99)
POTASSIUM: 5 meq/L (ref 3.5–5.1)
Sodium: 138 mEq/L (ref 135–145)

## 2018-10-02 LAB — B12 AND FOLATE PANEL
Folate: >24 ng/mL (ref >5.9)
Vitamin B-12: 1431 pg/mL — ABNORMAL HIGH (ref 211–911)

## 2018-10-02 LAB — TSH: TSH: 2.02 u[IU]/mL (ref 0.35–4.50)

## 2018-10-02 NOTE — Progress Notes (Signed)
Subjective:    Patient ID: Savannah Jimenez, female    DOB: 1935/11/18, 83 y.o.   MRN: [id number]  DOS:  10/02/2018 Type of visit - description: rov States that in general she feels well. She again said that  feels tired and is not as energetic as before. She is able to do all her ADLs, goes to the gym 3 times a week.  She simply gets tired after those activities  Also, complain of low back pain, bilateral at the level of T 10 to T12.  Typically the pain is triggered by go shopping and standing for long time, not triggered by going to the gym.  B12 deficiency: Due for labs  Wt Readings from Last 3 Encounters:  10/02/18 115 lb 8 oz (52.4 kg)  07/04/18 116 lb (52.6 kg)  06/03/18 115 lb (52.2 kg)    Review of Systems No fever chills No chest pain, palpitations or difficulty breathing when she is active.  She gets tired after being physically active No paresthesias in the lower extremities related to the back pain.  Back pain does not radiate.  Past Medical History:  Diagnosis Date  . Allergy   . Arthritis   . GERD (gastroesophageal reflux disease)   . HOH (hard of hearing)   . Osteoporosis     Past Surgical History:  Procedure Laterality Date  . ABDOMINAL HYSTERECTOMY     still have cervix  . ARTERY BIOPSY Right 10/06/2014   Procedure: BIOPSY TEMPORAL ARTERY;  Surgeon: Armandina Gemma, MD;  Location: [facility];  Service: General;  Laterality: Right;  . BREAST EXCISIONAL BIOPSY Right    over 20 years ago; lymph nodes removed  . CATARACT EXTRACTION  10/2011   Left  . colonscopy  2007  . INNER EAR SURGERY  1974   both stapes replaced-steal  . TUBAL LIGATION      Social History   Socioeconomic History  . Marital status: Married    Spouse name: Not on file  . Number of children: 2  . Years of education: Not on file  . Highest education level: Not on file  Occupational History  . Occupation: retired , day care  Social Needs  . Financial resource strain: Not  on file  . Food insecurity:    Worry: Not on file    Inability: Not on file  . Transportation needs:    Medical: Not on file    Non-medical: Not on file  Tobacco Use  . Smoking status: Never Smoker  . Smokeless tobacco: Never Used  Substance and Sexual Activity  . Alcohol use: Yes    Comment: rarely  . Drug use: No  . Sexual activity: Not on file  Lifestyle  . Physical activity:    Days per week: Not on file    Minutes per session: Not on file  . Stress: Not on file  Relationships  . Social connections:    Talks on phone: Not on file    Gets together: Not on file    Attends religious service: Not on file    Active member of club or organization: Not on file    Attends meetings of clubs or organizations: Not on file    Relationship status: Not on file  . Intimate partner violence:    Fear of current or ex partner: Not on file    Emotionally abused: Not on file    Physically abused: Not on file    Forced sexual activity:  Not on file  Other Topics Concern  . Not on file  Social History Narrative   2 g- children   2 g-g- children      Allergies as of 10/02/2018      Reactions   Tetanus Toxoids    Arm swelling    Penicillins    Sulfacetamide Sodium       Medication List       Accurate as of October 02, 2018 11:26 AM. Always use your most recent med list.        B-12 1000 MCG Tabs Take by mouth.   calcium citrate-vitamin D 315-200 MG-UNIT tablet Commonly known as:  CITRACAL+D Take 1 tablet by mouth daily.   fish oil-omega-3 fatty acids 1000 MG capsule Take 2 g by mouth daily.   fluticasone 50 MCG/ACT nasal spray Commonly known as:  FLONASE SHAKE WELL AND USE 2 SPRAYS IN EACH NOSTRIL DAILY   Melatonin 3 MG Tabs Take 1 tablet by mouth at bedtime.   VISION-VITE PRESERVE PO Take 2 tablets by mouth daily.   zolpidem 5 MG tablet Commonly known as:  AMBIEN Take 1 tablet (5 mg total) by mouth at bedtime as needed.           Objective:   Physical  Exam BP 122/70 (BP Location: Left Arm, Patient Position: Sitting, Cuff Size: Small)   Pulse 82   Temp 98 F (36.7 C) (Oral)   Resp 16   Ht 5\' 3"  (1.6 m)   Wt 115 lb 8 oz (52.4 kg)   SpO2 95%   BMI 20.46 kg/m  General:   Well developed, NAD, BMI noted.  HEENT:  Normocephalic . Face symmetric, atraumatic Lungs:  CTA B Normal respiratory effort, no intercostal retractions, no accessory muscle use. Heart: RRR,  no murmur.  no pretibial edema bilaterally  Abdomen:  Not distended, soft, non-tender.  Pulsatile mass at the epigastric area, not tender, no bruit. Skin: Not pale. Not jaundice Neurologic:  alert & oriented X3.  Speech normal, gait appropriate for age and unassisted Psych--  Cognition and judgment appear intact.  Cooperative with normal attention span and concentration.  Behavior appropriate. No anxious or depressed appearing.     Assessment     Assessment, transferring from Dr. Burnice Logan (01/2018)  Allergies GERD Osteoporosis -Tscore  -2.2 (10-2017) Insomnia H/o polymyalgia rheumatica; used to see Dr Charlestine Night, T.A. Bx 2016  PLAN Fatigue: As described above, I do not think there is an underlying pathological process.  Recommend to continue staying active.  For completeness we will check BMP, CBC and TSH. B12 deficiency: On supplements, checking levels today. Pulsatile mass abdomen: Suspect AAA, check abdominal ultrasound.  Unable to find a code for AAA screening. Low back pain: As described above, recommend PT, patient declined, we agreed that she will do some stretching on her own.  Back pain is usually triggered by going shopping and walking, recommend a warm compress and Tylenol as needed. RTC 4 to 5 months

## 2018-10-02 NOTE — Progress Notes (Signed)
Pre visit review using our clinic review tool, if applicable. No additional management support is needed unless otherwise documented below in the visit note. 

## 2018-10-02 NOTE — Patient Instructions (Signed)
GO TO THE LAB : Get the blood work     GO TO THE FRONT DESK Schedule your next appointment   for a checkup in 4 to 5 months  We will schedule ultrasound of your aorta, expect a phone call  Stay active  For back pain: Warm compress before you go shopping to prevent back pain Take Tylenol as needed Try to stretch your back, good information is found at the [facility] website, call if you like to see a physical therapist

## 2018-10-03 NOTE — Assessment & Plan Note (Signed)
Fatigue: As described above, I do not think there is an underlying pathological process.  Recommend to continue staying active.  For completeness we will check BMP, CBC and TSH. B12 deficiency: On supplements, checking levels today. Pulsatile mass abdomen: Suspect AAA, check abdominal ultrasound.  Unable to find a code for AAA screening. Low back pain: As described above, recommend PT, patient declined, we agreed that she will do some stretching on her own.  Back pain is usually triggered by going shopping and walking, recommend a warm compress and Tylenol as needed. RTC 4 to 5 months

## 2018-10-04 ENCOUNTER — Ambulatory Visit (HOSPITAL_BASED_OUTPATIENT_CLINIC_OR_DEPARTMENT_OTHER)
Admission: RE | Admit: 2018-10-04 | Discharge: 2018-10-04 | Disposition: A | Discharge status: Home / Self Care | Payer: Medicare Other | Source: Ambulatory Visit | Attending: Internal Medicine | Admitting: Internal Medicine

## 2018-10-04 ENCOUNTER — Other Ambulatory Visit: Payer: Self-pay

## 2018-10-04 DIAGNOSIS — R1900 Intra-abdominal and pelvic swelling, mass and lump, unspecified site: Secondary | ICD-10-CM | POA: Insufficient documentation

## 2018-10-04 DIAGNOSIS — N200 Calculus of kidney: Secondary | ICD-10-CM | POA: Diagnosis not present

## 2018-10-04 DIAGNOSIS — R0989 Other specified symptoms and signs involving the circulatory and respiratory systems: Secondary | ICD-10-CM | POA: Insufficient documentation

## 2018-11-07 ENCOUNTER — Ambulatory Visit: Payer: Medicare Other

## 2018-12-02 ENCOUNTER — Other Ambulatory Visit: Payer: Self-pay

## 2018-12-02 ENCOUNTER — Encounter (INDEPENDENT_AMBULATORY_CARE_PROVIDER_SITE_OTHER): Payer: Medicare Other | Admitting: Ophthalmology

## 2018-12-02 DIAGNOSIS — H43813 Vitreous degeneration, bilateral: Secondary | ICD-10-CM

## 2018-12-02 DIAGNOSIS — H353131 Nonexudative age-related macular degeneration, bilateral, early dry stage: Secondary | ICD-10-CM | POA: Diagnosis not present

## 2018-12-02 DIAGNOSIS — H43823 Vitreomacular adhesion, bilateral: Secondary | ICD-10-CM | POA: Diagnosis not present

## 2018-12-20 ENCOUNTER — Ambulatory Visit: Payer: Medicare Other

## 2018-12-24 ENCOUNTER — Ambulatory Visit
Admission: RE | Admit: 2018-12-24 | Discharge: 2018-12-24 | Disposition: A | Discharge status: Home / Self Care | Payer: Medicare Other | Source: Ambulatory Visit | Attending: Internal Medicine | Admitting: Internal Medicine

## 2018-12-24 ENCOUNTER — Other Ambulatory Visit: Payer: Self-pay

## 2018-12-24 DIAGNOSIS — Z1231 Encounter for screening mammogram for malignant neoplasm of breast: Secondary | ICD-10-CM

## 2019-01-09 DIAGNOSIS — H353132 Nonexudative age-related macular degeneration, bilateral, intermediate dry stage: Secondary | ICD-10-CM | POA: Diagnosis not present

## 2019-01-09 DIAGNOSIS — Z961 Presence of intraocular lens: Secondary | ICD-10-CM | POA: Diagnosis not present

## 2019-01-09 DIAGNOSIS — H40013 Open angle with borderline findings, low risk, bilateral: Secondary | ICD-10-CM | POA: Diagnosis not present

## 2019-01-09 DIAGNOSIS — H04123 Dry eye syndrome of bilateral lacrimal glands: Secondary | ICD-10-CM | POA: Diagnosis not present

## 2019-01-29 ENCOUNTER — Ambulatory Visit (INDEPENDENT_AMBULATORY_CARE_PROVIDER_SITE_OTHER): Payer: Medicare Other | Admitting: Internal Medicine

## 2019-01-29 ENCOUNTER — Encounter: Payer: Self-pay | Admitting: Internal Medicine

## 2019-01-29 ENCOUNTER — Other Ambulatory Visit: Payer: Self-pay

## 2019-01-29 VITALS — BP 141/73 | HR 94 | Temp 98.1°F | Resp 16 | Ht 63.0 in | Wt 114.0 lb

## 2019-01-29 DIAGNOSIS — R0989 Other specified symptoms and signs involving the circulatory and respiratory systems: Secondary | ICD-10-CM | POA: Diagnosis not present

## 2019-01-29 DIAGNOSIS — Z23 Encounter for immunization: Secondary | ICD-10-CM | POA: Diagnosis not present

## 2019-01-29 DIAGNOSIS — E538 Deficiency of other specified B group vitamins: Secondary | ICD-10-CM

## 2019-01-29 DIAGNOSIS — R5383 Other fatigue: Secondary | ICD-10-CM | POA: Diagnosis not present

## 2019-01-29 MED ORDER — SHINGRIX 50 MCG/0.5ML IM SUSR: 0.5000 mL | Freq: Once | INTRAMUSCULAR | 1 refills | Status: AC

## 2019-01-29 NOTE — Progress Notes (Signed)
Subjective:    Patient ID: Savannah Jimenez, female    DOB: [birth year], 83 y.o.   MRN: [id number]  DOS:  01/29/2019 Type of visit - description: f/u  In general feeling well. Fatigue is at baseline, she is able to do all her activities of daily living without any problems.   Review of Systems Denies fever chills or weight loss No chest pain, difficulty breathing or DOE. No lower extremity edema Occasionally has nausea in the morning, never has vomited.  No diarrhea /abdominal pain /no change in the color of the stools.  Occasionally constipated.  Past Medical History:  Diagnosis Date  . Allergy   . Arthritis   . GERD (gastroesophageal reflux disease)   . HOH (hard of hearing)   . Osteoporosis     Past Surgical History:  Procedure Laterality Date  . ABDOMINAL HYSTERECTOMY     still have cervix  . ARTERY BIOPSY Right 10/06/2014   Procedure: BIOPSY TEMPORAL ARTERY;  Surgeon: Armandina Gemma, MD;  Location: [facility];  Service: General;  Laterality: Right;  . BREAST EXCISIONAL BIOPSY Right    over 20 years ago; lymph nodes removed  . CATARACT EXTRACTION  10/2011   Left  . colonscopy  2007  . INNER EAR SURGERY  1974   both stapes replaced-steal  . TUBAL LIGATION      Social History   Socioeconomic History  . Marital status: Married    Spouse name: Not on file  . Number of children: 2  . Years of education: Not on file  . Highest education level: Not on file  Occupational History  . Occupation: retired , day care  Social Needs  . Financial resource strain: Not on file  . Food insecurity    Worry: Not on file    Inability: Not on file  . Transportation needs    Medical: Not on file    Non-medical: Not on file  Tobacco Use  . Smoking status: Never Smoker  . Smokeless tobacco: Never Used  Substance and Sexual Activity  . Alcohol use: Yes    Comment: rarely  . Drug use: No  . Sexual activity: Not on file  Lifestyle  . Physical activity    Days per  week: Not on file    Minutes per session: Not on file  . Stress: Not on file  Relationships  . Social Herbalist on phone: Not on file    Gets together: Not on file    Attends religious service: Not on file    Active member of club or organization: Not on file    Attends meetings of clubs or organizations: Not on file    Relationship status: Not on file  . Intimate partner violence    Fear of current or ex partner: Not on file    Emotionally abused: Not on file    Physically abused: Not on file    Forced sexual activity: Not on file  Other Topics Concern  . Not on file  Social History Narrative   2 g- children   2 g-g- children      Allergies as of 01/29/2019      Reactions   Tetanus Toxoids    Arm swelling    Penicillins    Sulfacetamide Sodium       Medication List       Accurate as of January 29, 2019 11:59 PM. If you have any questions, ask your nurse or  doctor.        B-12 1000 MCG Tabs Take by mouth.   calcium citrate-vitamin D 315-200 MG-UNIT tablet Commonly known as: CITRACAL+D Take 1 tablet by mouth daily.   fish oil-omega-3 fatty acids 1000 MG capsule Take 2 g by mouth daily.   fluticasone 50 MCG/ACT nasal spray Commonly known as: FLONASE SHAKE WELL AND USE 2 SPRAYS IN EACH NOSTRIL DAILY What changed: See the new instructions.   Melatonin 3 MG Tabs Take 1 tablet by mouth at bedtime.   Shingrix injection Generic drug: Zoster Vaccine Adjuvanted Inject 0.5 mLs into the muscle once for 1 dose. Started by: Kathlene November, MD   VISION-VITE PRESERVE PO Take 2 tablets by mouth daily.   zolpidem 5 MG tablet Commonly known as: AMBIEN Take 1 tablet (5 mg total) by mouth at bedtime as needed.           Objective:   Physical Exam BP (!) 141/73 (BP Location: Left Arm, Patient Position: Sitting, Cuff Size: Small)   Pulse 94   Temp 98.1 F (36.7 C) (Oral)   Resp 16   Ht 5\' 3"  (1.6 m)   Wt 114 lb (51.7 kg)   SpO2 94%   BMI 20.19 kg/m   General:   Well developed, NAD, BMI noted.  HEENT:  Normocephalic . Face symmetric, atraumatic Lungs:  CTA B Normal respiratory effort, no intercostal retractions, no accessory muscle use. Heart: RRR,  no murmur.  no pretibial edema bilaterally  Abdomen:  Not distended, soft, non-tender. No rebound or rigidity.  Palpable, nontender aorta without a bruit at the upper abdomen. Skin: Not pale. Not jaundice Neurologic:  alert & oriented X3.  Speech normal, gait appropriate for age and unassisted Psych--  Cognition and judgment appear intact.  Cooperative with normal attention span and concentration.  Behavior appropriate. No anxious or depressed appearing.      Assessment     Assessment, transferring from Dr. Burnice Logan (01/2018)  Allergies GERD Osteoporosis -Tscore  -2.2 (10-2017) Insomnia H/o polymyalgia rheumatica; used to see Dr Charlestine Night, T.A. Bx 2016 Palpable abdominal aorta: Abdominal ultrasound  09/2018 negative for AAA  PLAN Fatigue: At baseline B12 deficiency: Last B12 levels very good, on oral supplements. Episodic nausea: See above, recommend observation, recent ultrasound showed no gallbladder problems. Palpable aorta: Ultrasound was negative for AAA. Preventive care: Wonders why I am not doing a "physical exam".  Explained patient that due to Medicare rules I am not able to do a CPX  but she is entitled to a Medicare wellness  which she does. I reviewe her preventive care regularly. Today we are going to provide [facility] 23, and her Shingrix prescription.  Strongly recommend a flu shot RTC 6 months

## 2019-01-29 NOTE — Progress Notes (Signed)
Pre visit review using our clinic review tool, if applicable. No additional management support is needed unless otherwise documented below in the visit note. 

## 2019-01-29 NOTE — Patient Instructions (Signed)
  GO TO THE FRONT DESK Schedule your next appointment for a check up in 6 months

## 2019-01-30 NOTE — Assessment & Plan Note (Signed)
Fatigue: At baseline B12 deficiency: Last B12 levels very good, on oral supplements. Episodic nausea: See above, recommend observation, recent ultrasound showed no gallbladder problems. Palpable aorta: Ultrasound was negative for AAA. Preventive care: Wonders why I am not doing a "physical exam".  Explained patient that due to Medicare rules I am not able to do a CPX  but she is entitled to a Medicare wellness  which she does. I reviewe her preventive care regularly. Today we are going to provide [facility] 23, and her Shingrix prescription.  Strongly recommend a flu shot RTC 6 months

## 2019-04-09 DIAGNOSIS — Z86018 Personal history of other benign neoplasm: Secondary | ICD-10-CM | POA: Diagnosis not present

## 2019-04-09 DIAGNOSIS — L570 Actinic keratosis: Secondary | ICD-10-CM | POA: Diagnosis not present

## 2019-04-09 DIAGNOSIS — L821 Other seborrheic keratosis: Secondary | ICD-10-CM | POA: Diagnosis not present

## 2019-04-09 DIAGNOSIS — Z23 Encounter for immunization: Secondary | ICD-10-CM | POA: Diagnosis not present

## 2019-04-09 DIAGNOSIS — L219 Seborrheic dermatitis, unspecified: Secondary | ICD-10-CM | POA: Diagnosis not present

## 2019-04-09 DIAGNOSIS — Z85828 Personal history of other malignant neoplasm of skin: Secondary | ICD-10-CM | POA: Diagnosis not present

## 2019-04-11 DIAGNOSIS — Z23 Encounter for immunization: Secondary | ICD-10-CM | POA: Diagnosis not present

## 2019-07-03 ENCOUNTER — Telehealth: Payer: Self-pay

## 2019-07-03 NOTE — Telephone Encounter (Signed)
Copied from [place] [id number]. Topic: General - Inquiry >> Jul 03, 2019 10:23 AM Mathis Bud wrote: Reason for CRM: Patient is having trouble with her medicare wellness forms.  Patient would like a call back asap regarding this  Call back [phone]

## 2019-07-03 NOTE — Telephone Encounter (Signed)
Spoke w/ Pt- she had questions regarding completing e-check in on mychart- she wasn't able to complete for her visit w/ Glenard Haring on 07/07/2019. Informed we received the information she completed up until now- okay to leave the rest uncompleted. Pt verbalized understanding.

## 2019-07-04 NOTE — Progress Notes (Signed)
Virtual Visit via Video Note  I connected with patient on 07/07/19 at 10:00 AM EST by audio enabled telemedicine application and verified that I am speaking with the correct person using two identifiers.   THIS ENCOUNTER IS A VIRTUAL VISIT DUE TO COVID-19 - PATIENT WAS NOT SEEN IN THE OFFICE. PATIENT HAS CONSENTED TO VIRTUAL VISIT / TELEMEDICINE VISIT   Location of patient: home  Location of provider: office  I discussed the limitations of evaluation and management by telemedicine and the availability of in person appointments. The patient expressed understanding and agreed to proceed.   Subjective:   Savannah Jimenez is a 83 y.o. female who presents for Medicare Annual (Subsequent) preventive examination.  Review of Systems:  Home Safety/Smoke Alarms: Feels safe in home. Smoke alarms in place.  Lives with husband in 1 story home. Step over tub w/ grab rail.   Female:        Mammo- 12/24/18      Dexa scan- 11/05/17       CCS-Cologuard 07/12/17-negative Objective:     Vitals: BP (!) 117/51 Comment: pt reported  Pulse 84   There is no height or weight on file to calculate BMI.  Advanced Directives 07/07/2019 07/04/2018 10/06/2014 10/02/2014  Does Patient Have a Medical Advance Directive? Yes Yes Yes Yes  Type of Paramedic of [place];Living will [place];Living will - [place];Living will  Does patient want to make changes to medical advance directive? No - Patient declined - - No - Patient declined  Copy of [place] in Chart? Yes - validated most recent copy scanned in chart (See row information) Yes - validated most recent copy scanned in chart (See row information) No - copy requested -    Tobacco Social History   Tobacco Use  Smoking Status Never Smoker  Smokeless Tobacco Never Used     Counseling given: Not Answered   Clinical Intake: Pain : No/denies pain     Past Medical History:    Diagnosis Date  . Allergy   . Arthritis   . GERD (gastroesophageal reflux disease)   . HOH (hard of hearing)   . Osteoporosis    Past Surgical History:  Procedure Laterality Date  . ABDOMINAL HYSTERECTOMY     still have cervix  . ARTERY BIOPSY Right 10/06/2014   Procedure: BIOPSY TEMPORAL ARTERY;  Surgeon: Armandina Gemma, MD;  Location: [facility];  Service: General;  Laterality: Right;  . BREAST EXCISIONAL BIOPSY Right    over 20 years ago; lymph nodes removed  . CATARACT EXTRACTION  10/2011   Left  . colonscopy  2007  . INNER EAR SURGERY  1974   both stapes replaced-steal  . TUBAL LIGATION     Family History  Problem Relation Age of Onset  . Heart failure Mother   . Hypertension Sister   . Asthma Sister   . Hypertension Brother    Social History   Socioeconomic History  . Marital status: Married    Spouse name: Not on file  . Number of children: 2  . Years of education: Not on file  . Highest education level: Not on file  Occupational History  . Occupation: retired , day care  Tobacco Use  . Smoking status: Never Smoker  . Smokeless tobacco: Never Used  Substance and Sexual Activity  . Alcohol use: Yes    Comment: rarely  . Drug use: No  . Sexual activity: Not  on file  Other Topics Concern  . Not on file  Social History Narrative   2 g- children   2 g-g- children   Social Determinants of Health   Financial Resource Strain:   . Difficulty of Paying Living Expenses: Not on file  Food Insecurity:   . Worried About Charity fundraiser in the Last Year: Not on file  . Ran Out of Food in the Last Year: Not on file  Transportation Needs:   . Lack of Transportation (Medical): Not on file  . Lack of Transportation (Non-Medical): Not on file  Physical Activity:   . Days of Exercise per Week: Not on file  . Minutes of Exercise per Session: Not on file  Stress:   . Feeling of Stress : Not on file  Social Connections:   . Frequency of  Communication with Friends and Family: Not on file  . Frequency of Social Gatherings with Friends and Family: Not on file  . Attends Religious Services: Not on file  . Active Member of Clubs or Organizations: Not on file  . Attends Archivist Meetings: Not on file  . Marital Status: Not on file    Outpatient Encounter Medications as of 07/07/2019  Medication Sig  . calcium citrate-vitamin D (CITRACAL+D) 315-200 MG-UNIT tablet Take 1 tablet by mouth daily.  . Cyanocobalamin (B-12) 1000 MCG TABS Take by mouth.  . fish oil-omega-3 fatty acids 1000 MG capsule Take 2 g by mouth daily.    . fluticasone (FLONASE) 50 MCG/ACT nasal spray SHAKE WELL AND USE 2 SPRAYS IN EACH NOSTRIL DAILY (Patient taking differently: SHAKE WELL, USE 2 SPRAYS EACH NOSTRIL DAILY AS NEEDED)  . Melatonin 3 MG TABS Take 1 tablet by mouth at bedtime.  . Multiple Vitamins-Minerals (PRESERVISION AREDS 2) CAPS Take 1 capsule by mouth 2 (two) times daily.   Marland Kitchen zolpidem (AMBIEN) 5 MG tablet Take 1 tablet (5 mg total) by mouth at bedtime as needed.  . [DISCONTINUED] Multiple Vitamins-Minerals (VISION-VITE PRESERVE PO) Take 2 tablets by mouth daily.   No facility-administered encounter medications on file as of 07/07/2019.    Activities of Daily Living In your present state of health, do you have any difficulty performing the following activities: 07/07/2019  Hearing? Y  Comment wears hearing aids.  Vision? N  Difficulty concentrating or making decisions? N  Walking or climbing stairs? N  Dressing or bathing? N  Doing errands, shopping? N  Preparing Food and eating ? N  Using the Toilet? N  In the past six months, have you accidently leaked urine? N  Do you have problems with loss of bowel control? N  Managing your Medications? N  Managing your Finances? N  Housekeeping or managing your Housekeeping? N  Some recent data might be hidden    Patient Care Team: Colon Branch, MD as PCP - General (Internal  Medicine) Hayden Pedro, MD as Consulting Physician (Ophthalmology) Hortencia Pilar, MD as Consulting Physician (Ophthalmology)    Assessment:   This is a routine wellness examination for [name]. Physical assessment deferred to PCP.  Exercise Activities and Dietary recommendations Current Exercise Habits: Home exercise routine, Type of exercise: walking;strength training/weights, Time (Minutes): 45, Frequency (Times/Week): 3, Weekly Exercise (Minutes/Week): 135, Exercise limited by: None identified Diet (meal preparation, eat out, water intake, caffeinated beverages, dairy products, fruits and vegetables): 24 hr recall Breakfast: muffin, coffee Lunch: ribs, salad, cake Dinner:  Lentil soup Pt reports she knows she needs to drink more  water.     Goals    . Patient Stated     Maintain current health and independence.        Fall Risk Fall Risk  07/07/2019 07/04/2018 01/28/2018 11/22/2016 06/23/2016  Falls in the past year? 0 0 No No No  Follow up Education provided;Falls prevention discussed - - - -    Depression Screen PHQ 2/9 Scores 07/04/2018 01/28/2018 11/22/2016 06/23/2016  PHQ - 2 Score 0 0 0 0     Cognitive Function   Ad8 score reviewed for issues:  Issues making decisions:no  Less interest in hobbies / activities:no  Repeats questions, stories (family complaining):no  Trouble using ordinary gadgets (microwave, computer, phone):no  Forgets the month or year: no  Mismanaging finances: no  Remembering appts:no  Daily problems with thinking and/or memory:no Ad8 score is=0        Immunization History  Administered Date(s) Administered  . Influenza Whole 04/05/2010  . Influenza, High Dose Seasonal PF 04/30/2014, 05/04/2015, 04/25/2016, 04/11/2019  . Influenza-Unspecified 05/01/2017, 04/25/2018  . Pneumococcal Conjugate-13 06/03/2014  . Pneumococcal Polysaccharide-23 04/19/2009, 01/29/2019  . Zoster 11/04/2007, 12/24/2009   Screening Tests Health  Maintenance  Topic Date Due  . TETANUS/TDAP  05/30/2021  . INFLUENZA VACCINE  Completed  . DEXA SCAN  Completed  . PNA vac Low Risk Adult  Completed     Plan:   See you next year!  Continue to eat heart healthy diet (full of fruits, vegetables, whole grains, lean protein, water--limit salt, fat, and sugar intake) and increase physical activity as tolerated.  Continue doing brain stimulating activities (puzzles, reading, adult coloring books, staying active) to keep memory sharp.   Bring a copy of your living will and/or healthcare power of attorney to your next office visit.   I have personally reviewed and noted the following in the patient's chart:   . Medical and social history . Use of alcohol, tobacco or illicit drugs  . Current medications and supplements . Functional ability and status . Nutritional status . Physical activity . Advanced directives . List of other physicians . Hospitalizations, surgeries, and ER visits in previous 12 months . Vitals . Screenings to include cognitive, depression, and falls . Referrals and appointments  In addition, I have reviewed and discussed with patient certain preventive protocols, quality metrics, and best practice recommendations. A written personalized care plan for preventive services as well as general preventive health recommendations were provided to patient.     Shela Nevin, [place]  07/07/2019

## 2019-07-07 ENCOUNTER — Encounter: Payer: Self-pay | Admitting: *Deleted

## 2019-07-07 ENCOUNTER — Ambulatory Visit (INDEPENDENT_AMBULATORY_CARE_PROVIDER_SITE_OTHER): Payer: Medicare Other | Admitting: *Deleted

## 2019-07-07 ENCOUNTER — Other Ambulatory Visit: Payer: Self-pay

## 2019-07-07 VITALS — BP 117/51 | HR 84

## 2019-07-07 DIAGNOSIS — Z0000 Encounter for general adult medical examination without abnormal findings: Secondary | ICD-10-CM | POA: Diagnosis not present

## 2019-07-07 NOTE — Patient Instructions (Signed)
See you next year!  Continue to eat heart healthy diet (full of fruits, vegetables, whole grains, lean protein, water--limit salt, fat, and sugar intake) and increase physical activity as tolerated.  Continue doing brain stimulating activities (puzzles, reading, adult coloring books, staying active) to keep memory sharp.   Bring a copy of your living will and/or healthcare power of attorney to your next office visit.   Savannah Jimenez , Thank you for taking time to come for your Medicare Wellness Visit. I appreciate your ongoing commitment to your health goals. Please review the following plan we discussed and let me know if I can assist you in the future.   These are the goals we discussed: Goals    . Patient Stated     Maintain current health and independence.        This is a list of the screening recommended for you and due dates:  Health Maintenance  Topic Date Due  . Tetanus Vaccine  05/30/2021  . Flu Shot  Completed  . DEXA scan (bone density measurement)  Completed  . Pneumonia vaccines  Completed    Preventive Care 42 Years and Older, Female Preventive care refers to lifestyle choices and visits with your health care provider that can promote health and wellness. This includes:  A yearly physical exam. This is also called an annual well check.  Regular dental and eye exams.  Immunizations.  Screening for certain conditions.  Healthy lifestyle choices, such as diet and exercise. What can I expect for my preventive care visit? Physical exam Your health care provider will check:  Height and weight. These may be used to calculate body mass index (BMI), which is a measurement that tells if you are at a healthy weight.  Heart rate and blood pressure.  Your skin for abnormal spots. Counseling Your health care provider may ask you questions about:  Alcohol, tobacco, and drug use.  Emotional well-being.  Home and relationship well-being.  Sexual  activity.  Eating habits.  History of falls.  Memory and ability to understand (cognition).  Work and work Statistician.  Pregnancy and menstrual history. What immunizations do I need?  Influenza (flu) vaccine  This is recommended every year. Tetanus, diphtheria, and pertussis (Tdap) vaccine  You may need a Td booster every 10 years. Varicella (chickenpox) vaccine  You may need this vaccine if you have not already been vaccinated. Zoster (shingles) vaccine  You may need this after age 1. Pneumococcal conjugate (PCV13) vaccine  One dose is recommended after age 27. Pneumococcal polysaccharide (PPSV23) vaccine  One dose is recommended after age 18. Measles, mumps, and rubella (MMR) vaccine  You may need at least one dose of MMR if you were born in 1957 or later. You may also need a second dose. Meningococcal conjugate (MenACWY) vaccine  You may need this if you have certain conditions. Hepatitis A vaccine  You may need this if you have certain conditions or if you travel or work in places where you may be exposed to hepatitis A. Hepatitis B vaccine  You may need this if you have certain conditions or if you travel or work in places where you may be exposed to hepatitis B. Haemophilus influenzae type b (Hib) vaccine  You may need this if you have certain conditions. You may receive vaccines as individual doses or as more than one vaccine together in one shot (combination vaccines). Talk with your health care provider about the risks and benefits of combination vaccines. What tests  do I need? Blood tests  Lipid and cholesterol levels. These may be checked every 5 years, or more frequently depending on your overall health.  Hepatitis C test.  Hepatitis B test. Screening  Lung cancer screening. You may have this screening every year starting at age 47 if you have a 30-pack-year history of smoking and currently smoke or have quit within the past 15  years.  Colorectal cancer screening. All adults should have this screening starting at age 40 and continuing until age 59. Your health care provider may recommend screening at age 64 if you are at increased risk. You will have tests every 1-10 years, depending on your results and the type of screening test.  Diabetes screening. This is done by checking your blood sugar (glucose) after you have not eaten for a while (fasting). You may have this done every 1-3 years.  Mammogram. This may be done every 1-2 years. Talk with your health care provider about how often you should have regular mammograms.  BRCA-related cancer screening. This may be done if you have a family history of breast, ovarian, tubal, or peritoneal cancers. Other tests  Sexually transmitted disease (STD) testing.  Bone density scan. This is done to screen for osteoporosis. You may have this done starting at age 22. Follow these instructions at home: Eating and drinking  Eat a diet that includes fresh fruits and vegetables, whole grains, lean protein, and low-fat dairy products. Limit your intake of foods with high amounts of sugar, saturated fats, and salt.  Take vitamin and mineral supplements as recommended by your health care provider.  Do not drink alcohol if your health care provider tells you not to drink.  If you drink alcohol: ? Limit how much you have to 0-1 drink a day. ? Be aware of how much alcohol is in your drink. In the [place] one drink equals one 12 oz bottle of beer (355 mL), one 5 oz glass of wine (148 mL), or one 1 oz glass of hard liquor (44 mL). Lifestyle  Take daily care of your teeth and gums.  Stay active. Exercise for at least 30 minutes on 5 or more days each week.  Do not use any products that contain nicotine or tobacco, such as cigarettes, e-cigarettes, and chewing tobacco. If you need help quitting, ask your health care provider.  If you are sexually active, practice safe sex. Use a  condom or other form of protection in order to prevent STIs (sexually transmitted infections).  Talk with your health care provider about taking a low-dose aspirin or statin. What's next?  Go to your health care provider once a year for a well check visit.  Ask your health care provider how often you should have your eyes and teeth checked.  Stay up to date on all vaccines. This information is not intended to replace advice given to you by your health care provider. Make sure you discuss any questions you have with your health care provider. Document Released: 08/06/2015 Document Revised: 07/04/2018 Document Reviewed: 07/04/2018 Elsevier Patient Education  2020 [employer].

## 2019-08-01 ENCOUNTER — Other Ambulatory Visit: Payer: Self-pay

## 2019-08-01 ENCOUNTER — Ambulatory Visit (INDEPENDENT_AMBULATORY_CARE_PROVIDER_SITE_OTHER): Payer: Medicare Other | Admitting: Internal Medicine

## 2019-08-01 VITALS — BP 124/64

## 2019-08-01 DIAGNOSIS — M069 Rheumatoid arthritis, unspecified: Secondary | ICD-10-CM

## 2019-08-01 DIAGNOSIS — E7800 Pure hypercholesterolemia, unspecified: Secondary | ICD-10-CM | POA: Diagnosis not present

## 2019-08-01 DIAGNOSIS — Z8739 Personal history of other diseases of the musculoskeletal system and connective tissue: Secondary | ICD-10-CM | POA: Diagnosis not present

## 2019-08-01 DIAGNOSIS — M545 Low back pain, unspecified: Secondary | ICD-10-CM

## 2019-08-01 DIAGNOSIS — M818 Other osteoporosis without current pathological fracture: Secondary | ICD-10-CM

## 2019-08-01 DIAGNOSIS — M1990 Unspecified osteoarthritis, unspecified site: Secondary | ICD-10-CM | POA: Diagnosis not present

## 2019-08-01 MED ORDER — ZOLPIDEM TARTRATE 5 MG PO TABS: 5.0000 mg | ORAL_TABLET | Freq: Every evening | ORAL | 0 refills | Status: DC | PRN

## 2019-08-01 NOTE — Progress Notes (Signed)
Subjective:    Patient ID: Savannah Jimenez, female    DOB: [birth year], 84 y.o.   MRN: [id number]  DOS:  08/01/2019  Type of visit - description: Virtual Visit via Video Note  I connected with above pt  by a video enabled telemedicine application and verified that I am speaking with the correct person using two identifiers.   THIS ENCOUNTER IS A VIRTUAL VISIT DUE TO COVID-19 - PATIENT WAS NOT SEEN IN THE OFFICE. PATIENT HAS CONSENTED TO VIRTUAL VISIT / TELEMEDICINE VISIT   Location of patient: home  Location of provider: office  I discussed the limitations of evaluation and management by telemedicine and the availability of in person appointments. The patient expressed understanding and agreed to proceed.  Routine follow-up Insomnia: Takes melatonin regularly, needs a refill on zolpidem. Concerned about PMR returning: is her observation that when she stands up she has low back pain.  Would like appropriate blood work done. Also, for the last few weeks her finger joints are TTP. Chronic fatigue: She seems to be feeling better.    Review of Systems  Denies fever chills She did lost 3 pound recently without any major changes on her diet. She denies headaches, visual disturbances. No jaw claudication No rash anywhere. Denies pain around the shoulders or the hips.   Past Medical History:  Diagnosis Date  . Allergy   . Arthritis   . GERD (gastroesophageal reflux disease)   . HOH (hard of hearing)   . Osteoporosis     Past Surgical History:  Procedure Laterality Date  . ABDOMINAL HYSTERECTOMY     still have cervix  . ARTERY BIOPSY Right 10/06/2014   Procedure: BIOPSY TEMPORAL ARTERY;  Surgeon: Armandina Gemma, MD;  Location: [facility];  Service: General;  Laterality: Right;  . BREAST EXCISIONAL BIOPSY Right    over 20 years ago; lymph nodes removed  . CATARACT EXTRACTION  10/2011   Left  . colonscopy  2007  . INNER EAR SURGERY  1974   both stapes replaced-steal    . TUBAL LIGATION      Social History   Socioeconomic History  . Marital status: Married    Spouse name: Not on file  . Number of children: 2  . Years of education: Not on file  . Highest education level: Not on file  Occupational History  . Occupation: retired , day care  Tobacco Use  . Smoking status: Never Smoker  . Smokeless tobacco: Never Used  Substance and Sexual Activity  . Alcohol use: Yes    Comment: rarely  . Drug use: No  . Sexual activity: Not on file  Other Topics Concern  . Not on file  Social History Narrative   2 g- children   2 g-g- children   Social Determinants of Health   Financial Resource Strain:   . Difficulty of Paying Living Expenses: Not on file  Food Insecurity:   . Worried About Charity fundraiser in the Last Year: Not on file  . Ran Out of Food in the Last Year: Not on file  Transportation Needs:   . Lack of Transportation (Medical): Not on file  . Lack of Transportation (Non-Medical): Not on file  Physical Activity:   . Days of Exercise per Week: Not on file  . Minutes of Exercise per Session: Not on file  Stress:   . Feeling of Stress : Not on file  Social Connections:   . Frequency of Communication with  Friends and Family: Not on file  . Frequency of Social Gatherings with Friends and Family: Not on file  . Attends Religious Services: Not on file  . Active Member of Clubs or Organizations: Not on file  . Attends Archivist Meetings: Not on file  . Marital Status: Not on file  Intimate Partner Violence:   . Fear of Current or Ex-Partner: Not on file  . Emotionally Abused: Not on file  . Physically Abused: Not on file  . Sexually Abused: Not on file      Allergies as of 08/01/2019      Reactions   Tetanus Toxoids    Arm swelling    Penicillins    Sulfacetamide Sodium       Medication List       Accurate as of August 01, 2019  9:15 AM. If you have any questions, ask your nurse or doctor.        B-12 1000  MCG Tabs Take by mouth.   calcium citrate-vitamin D 315-200 MG-UNIT tablet Commonly known as: CITRACAL+D Take 1 tablet by mouth daily.   fish oil-omega-3 fatty acids 1000 MG capsule Take 2 g by mouth daily.   fluticasone 50 MCG/ACT nasal spray Commonly known as: FLONASE SHAKE WELL AND USE 2 SPRAYS IN EACH NOSTRIL DAILY What changed: See the new instructions.   Melatonin 3 MG Tabs Take 1 tablet by mouth at bedtime.   PreserVision AREDS 2 Caps Take 1 capsule by mouth 2 (two) times daily.   zolpidem 5 MG tablet Commonly known as: AMBIEN Take 1 tablet (5 mg total) by mouth at bedtime as needed.           Objective:   Physical Exam There were no vitals taken for this visit. This is a virtual video visit, she is alert oriented x3, in no apparent distress. She showed me her hands, the DIPs and DIPs are definitely enlarged.    Assessment     Assessment, transferring from Dr. Burnice Logan (01/2018)  Allergies GERD Osteoporosis -Tscore  -2.2 (10-2017) Insomnia H/o polymyalgia rheumatica; used to see Dr Charlestine Night, T.A. Bx 2016 Palpable abdominal aorta: Abdominal ultrasound  09/2018 negative for AAA  PLAN Fatigue: Improved lately Osteoporosis: Recheck at T score in the following months .  Currently on calcium vitamin D Insomnia: Takes melatonin nightly and sometimes Ambien.  Request a refills.  Will do. Arthralgias, back pain, history of PMR The patient is concerned about PMR, review of system is negative except for a 3 pound weight loss. Plan: CMP, FLP, CBC, sed rate, x-ray hands History of high cholesterol: At some point LDL was 172.  Recheck a FLP. RTC for labs and x-rays at her convenience RTC for office visit in 4 months     I discussed the assessment and treatment plan with the patient. The patient was provided an opportunity to ask questions and all were answered. The patient agreed with the plan and demonstrated an understanding of the instructions.   The patient  was advised to call back or seek an in-person evaluation if the symptoms worsen or if the condition fails to improve as anticipated.

## 2019-08-02 NOTE — Assessment & Plan Note (Signed)
Fatigue: Improved lately Osteoporosis: Recheck at T score in the following months .  Currently on calcium vitamin D Insomnia: Takes melatonin nightly and sometimes Ambien.  Request a refills.  Will do. Arthralgias, back pain, history of PMR The patient is concerned about PMR, review of system is negative except for a 3 pound weight loss. Plan: CMP, FLP, CBC, sed rate, x-ray hands History of high cholesterol: At some point LDL was 172.  Recheck a FLP. RTC for labs and x-rays at her convenience RTC for office visit in 4 months

## 2019-08-04 ENCOUNTER — Other Ambulatory Visit: Payer: Self-pay | Admitting: Internal Medicine

## 2019-08-04 ENCOUNTER — Ambulatory Visit (HOSPITAL_BASED_OUTPATIENT_CLINIC_OR_DEPARTMENT_OTHER)
Admission: RE | Admit: 2019-08-04 | Discharge: 2019-08-04 | Disposition: A | Discharge status: Home / Self Care | Payer: Medicare Other | Source: Ambulatory Visit | Attending: Internal Medicine | Admitting: Internal Medicine

## 2019-08-04 ENCOUNTER — Other Ambulatory Visit: Payer: Self-pay

## 2019-08-04 DIAGNOSIS — M19041 Primary osteoarthritis, right hand: Secondary | ICD-10-CM | POA: Diagnosis not present

## 2019-08-04 DIAGNOSIS — Z1231 Encounter for screening mammogram for malignant neoplasm of breast: Secondary | ICD-10-CM

## 2019-08-04 DIAGNOSIS — M19042 Primary osteoarthritis, left hand: Secondary | ICD-10-CM | POA: Diagnosis not present

## 2019-08-04 DIAGNOSIS — M1990 Unspecified osteoarthritis, unspecified site: Secondary | ICD-10-CM | POA: Diagnosis not present

## 2019-08-08 ENCOUNTER — Other Ambulatory Visit (INDEPENDENT_AMBULATORY_CARE_PROVIDER_SITE_OTHER): Payer: Medicare Other

## 2019-08-08 ENCOUNTER — Other Ambulatory Visit: Payer: Self-pay

## 2019-08-08 DIAGNOSIS — Z8739 Personal history of other diseases of the musculoskeletal system and connective tissue: Secondary | ICD-10-CM

## 2019-08-08 DIAGNOSIS — E7800 Pure hypercholesterolemia, unspecified: Secondary | ICD-10-CM

## 2019-08-08 DIAGNOSIS — M1990 Unspecified osteoarthritis, unspecified site: Secondary | ICD-10-CM

## 2019-08-08 LAB — LIPID PANEL
Cholesterol: 251 mg/dL — ABNORMAL HIGH (ref 0–200)
HDL: 69.6 mg/dL (ref >39.00)
LDL Cholesterol: 155 mg/dL — ABNORMAL HIGH (ref 0–99)
NonHDL: 181.12
Total CHOL/HDL Ratio: 4
Triglycerides: 133 mg/dL (ref 0.0–149.0)
VLDL: 26.6 mg/dL (ref 0.0–40.0)

## 2019-08-08 LAB — CBC WITH DIFFERENTIAL/PLATELET
Basophils Absolute: 0 10*3/uL (ref 0.0–0.1)
Basophils Relative: 0.3 % (ref 0.0–3.0)
Eosinophils Absolute: 0 10*3/uL (ref 0.0–0.7)
Eosinophils Relative: 0.7 % (ref 0.0–5.0)
HCT: 39.6 % (ref 36.0–46.0)
Hemoglobin: 12.9 g/dL (ref 12.0–15.0)
Lymphocytes Relative: 32.9 % (ref 12.0–46.0)
Lymphs Abs: 1.7 10*3/uL (ref 0.7–4.0)
MCHC: 32.6 g/dL (ref 30.0–36.0)
MCV: 94.3 fl (ref 78.0–100.0)
Monocytes Absolute: 0.4 10*3/uL (ref 0.1–1.0)
Monocytes Relative: 7.3 % (ref 3.0–12.0)
Neutro Abs: 3 10*3/uL (ref 1.4–7.7)
Neutrophils Relative %: 58.8 % (ref 43.0–77.0)
Platelets: 243 10*3/uL (ref 150.0–400.0)
RBC: 4.2 Mil/uL (ref 3.87–5.11)
RDW: 13.6 % (ref 11.5–15.5)
WBC: 5 10*3/uL (ref 4.0–10.5)

## 2019-08-08 LAB — SEDIMENTATION RATE: Sed Rate: 14 mm/hr (ref 0–30)

## 2019-08-08 LAB — COMPREHENSIVE METABOLIC PANEL
ALT: 13 U/L (ref 0–35)
AST: 19 U/L (ref 0–37)
Albumin: 4.3 g/dL (ref 3.5–5.2)
Alkaline Phosphatase: 68 U/L (ref 39–117)
BUN: 18 mg/dL (ref 6–23)
CO2: 31 mEq/L (ref 19–32)
Calcium: 9.4 mg/dL (ref 8.4–10.5)
Chloride: 102 mEq/L (ref 96–112)
Creatinine, Ser: 0.65 mg/dL (ref 0.40–1.20)
GFR: 87.46 mL/min (ref >60.00)
Glucose, Bld: 103 mg/dL — ABNORMAL HIGH (ref 70–99)
Potassium: 4.4 mEq/L (ref 3.5–5.1)
Sodium: 139 mEq/L (ref 135–145)
Total Bilirubin: 0.7 mg/dL (ref 0.2–1.2)
Total Protein: 6.9 g/dL (ref 6.0–8.3)

## 2019-08-13 ENCOUNTER — Ambulatory Visit: Payer: Medicare Other | Attending: Internal Medicine

## 2019-08-13 DIAGNOSIS — Z23 Encounter for immunization: Secondary | ICD-10-CM

## 2019-08-13 NOTE — Progress Notes (Signed)
   Covid-19 Vaccination Clinic  Name:  Savannah Jimenez    MRN: [id number] DOB: 1935-10-17  08/13/2019  Savannah Jimenez was observed post Covid-19 immunization for 15 minutes without incidence. She was provided with Vaccine Information Sheet and instruction to access the V-Safe system.   Savannah Jimenez was instructed to call 911 with any severe reactions post vaccine: Marland Kitchen Difficulty breathing  . Swelling of your face and throat  . A fast heartbeat  . A bad rash all over your body  . Dizziness and weakness    Immunizations Administered    Name Date Dose VIS Date Route   Pfizer COVID-19 Vaccine 08/13/2019  9:26 AM 0.3 mL 07/04/2019 Intramuscular   Manufacturer: [employer]   Lot: [id number]   [place]: [id number]

## 2019-09-01 ENCOUNTER — Ambulatory Visit: Payer: Medicare Other | Attending: Internal Medicine

## 2019-09-01 DIAGNOSIS — Z23 Encounter for immunization: Secondary | ICD-10-CM | POA: Insufficient documentation

## 2019-09-01 NOTE — Progress Notes (Signed)
   Covid-19 Vaccination Clinic  Name:  Savannah Jimenez    MRN: [id number] DOB: 1936/04/23  09/01/2019  Ms. Kostiuk was observed post Covid-19 immunization for 15 minutes without incidence. She was provided with Vaccine Information Sheet and instruction to access the V-Safe system.   Ms. Zuch was instructed to call 911 with any severe reactions post vaccine: Marland Kitchen Difficulty breathing  . Swelling of your face and throat  . A fast heartbeat  . A bad rash all over your body  . Dizziness and weakness    Immunizations Administered    Name Date Dose VIS Date Route   Pfizer COVID-19 Vaccine 09/01/2019 11:44 AM 0.3 mL 07/04/2019 Intramuscular   Manufacturer: [place]   Lot: [id number]   [place]: [id number]

## 2019-12-01 ENCOUNTER — Ambulatory Visit (INDEPENDENT_AMBULATORY_CARE_PROVIDER_SITE_OTHER): Payer: Medicare Other | Admitting: Internal Medicine

## 2019-12-01 ENCOUNTER — Other Ambulatory Visit: Payer: Self-pay

## 2019-12-01 ENCOUNTER — Encounter: Payer: Self-pay | Admitting: Internal Medicine

## 2019-12-01 ENCOUNTER — Ambulatory Visit (HOSPITAL_BASED_OUTPATIENT_CLINIC_OR_DEPARTMENT_OTHER)
Admission: RE | Admit: 2019-12-01 | Discharge: 2019-12-01 | Disposition: A | Discharge status: Home / Self Care | Payer: Medicare Other | Source: Ambulatory Visit | Attending: Internal Medicine | Admitting: Internal Medicine

## 2019-12-01 VITALS — BP 138/58 | HR 83 | Temp 97.7°F | Resp 18 | Ht 63.0 in | Wt 110.4 lb

## 2019-12-01 DIAGNOSIS — I959 Hypotension, unspecified: Secondary | ICD-10-CM | POA: Diagnosis not present

## 2019-12-01 DIAGNOSIS — M818 Other osteoporosis without current pathological fracture: Secondary | ICD-10-CM | POA: Diagnosis not present

## 2019-12-01 DIAGNOSIS — E7800 Pure hypercholesterolemia, unspecified: Secondary | ICD-10-CM | POA: Diagnosis not present

## 2019-12-01 DIAGNOSIS — Z0000 Encounter for general adult medical examination without abnormal findings: Secondary | ICD-10-CM

## 2019-12-01 DIAGNOSIS — R5383 Other fatigue: Secondary | ICD-10-CM | POA: Diagnosis not present

## 2019-12-01 DIAGNOSIS — M8589 Other specified disorders of bone density and structure, multiple sites: Secondary | ICD-10-CM | POA: Diagnosis not present

## 2019-12-01 LAB — LIPID PANEL
Cholesterol: 225 mg/dL — ABNORMAL HIGH (ref 0–200)
HDL: 63.7 mg/dL (ref >39.00)
LDL Cholesterol: 138 mg/dL — ABNORMAL HIGH (ref 0–99)
NonHDL: 160.94
Total CHOL/HDL Ratio: 4
Triglycerides: 113 mg/dL (ref 0.0–149.0)
VLDL: 22.6 mg/dL (ref 0.0–40.0)

## 2019-12-01 LAB — COMPREHENSIVE METABOLIC PANEL
ALT: 15 U/L (ref 0–35)
AST: 20 U/L (ref 0–37)
Albumin: 4.2 g/dL (ref 3.5–5.2)
Alkaline Phosphatase: 59 U/L (ref 39–117)
BUN: 19 mg/dL (ref 6–23)
CO2: 30 mEq/L (ref 19–32)
Calcium: 9.3 mg/dL (ref 8.4–10.5)
Chloride: 102 mEq/L (ref 96–112)
Creatinine, Ser: 0.66 mg/dL (ref 0.40–1.20)
GFR: 85.86 mL/min (ref >60.00)
Glucose, Bld: 98 mg/dL (ref 70–99)
Potassium: 4.6 mEq/L (ref 3.5–5.1)
Sodium: 139 mEq/L (ref 135–145)
Total Bilirubin: 0.6 mg/dL (ref 0.2–1.2)
Total Protein: 6.6 g/dL (ref 6.0–8.3)

## 2019-12-01 NOTE — Progress Notes (Signed)
Subjective:    Patient ID: Savannah Jimenez, female    DOB: 10-06-1935, 84 y.o.   MRN: [id number]  DOS:  12/01/2019 Type of visit - description: Routine visit In general feeling well. Still has occasional fatigue. We also talk about osteoporosis, high cholesterol for ambulatory BPs. BP is are checked frequently over the last 6 months he has been low 1 or 2 times, around 100/50s.  She wonders if that is okay.  Wt Readings from Last 3 Encounters:  12/01/19 110 lb 6 oz (50.1 kg)  01/29/19 114 lb (51.7 kg)  10/02/18 115 lb 8 oz (52.4 kg)     Review of Systems Denies fever chills No chest pain no difficulty breathing No cough No anxiety or depression  Past Medical History:  Diagnosis Date  . Allergy   . Arthritis   . GERD (gastroesophageal reflux disease)   . HOH (hard of hearing)   . Osteoporosis     Past Surgical History:  Procedure Laterality Date  . ABDOMINAL HYSTERECTOMY     still have cervix  . ARTERY BIOPSY Right 10/06/2014   Procedure: BIOPSY TEMPORAL ARTERY;  Surgeon: Armandina Gemma, MD;  Location: [facility];  Service: General;  Laterality: Right;  . BREAST EXCISIONAL BIOPSY Right    over 20 years ago; lymph nodes removed  . CATARACT EXTRACTION  10/2011   Left  . colonscopy  2007  . INNER EAR SURGERY  1974   both stapes replaced-steal  . TUBAL LIGATION      Allergies as of 12/01/2019      Reactions   Tetanus Toxoids    Arm swelling    Penicillins    Sulfacetamide Sodium       Medication List       Accurate as of Dec 01, 2019 11:59 PM. If you have any questions, ask your nurse or doctor.        B-12 1000 MCG Tabs Take by mouth.   calcium citrate-vitamin D 315-200 MG-UNIT tablet Commonly known as: CITRACAL+D Take 1 tablet by mouth daily.   fish oil-omega-3 fatty acids 1000 MG capsule Take 2 g by mouth daily.   fluticasone 50 MCG/ACT nasal spray Commonly known as: FLONASE SHAKE WELL AND USE 2 SPRAYS IN EACH NOSTRIL DAILY What  changed: See the new instructions.   melatonin 3 MG Tabs tablet Take 1 tablet by mouth at bedtime.   PreserVision AREDS 2 Caps Take 1 capsule by mouth 2 (two) times daily.   zolpidem 5 MG tablet Commonly known as: AMBIEN Take 1 tablet (5 mg total) by mouth at bedtime as needed.          Objective:   Physical Exam BP (!) 138/58 (BP Location: Left Arm, Patient Position: Sitting, Cuff Size: Small)   Pulse 83   Temp 97.7 F (36.5 C) (Temporal)   Resp 18   Ht 5\' 3"  (1.6 m)   Wt 110 lb 6 oz (50.1 kg)   SpO2 97%   BMI 19.55 kg/m  General:   Well developed, NAD, BMI noted. HEENT:  Normocephalic . Face symmetric, atraumatic Lungs:  CTA B Normal respiratory effort, no intercostal retractions, no accessory muscle use. Heart: RRR,  no murmur.  Lower extremities: no pretibial edema bilaterally Abdomen: Soft, not tender, palpable nontender aorta. Skin: Not pale. Not jaundice Neurologic:  alert & oriented X3.  Speech normal, gait appropriate for age and unassisted Psych--  Cognition and judgment appear intact.  Cooperative with normal attention span and  concentration.  Behavior appropriate. No anxious or depressed appearing.      Assessment       Assessment, transferring from Dr. Burnice Logan (01/2018)  Allergies GERD Osteoporosis -Tscore  -2.2 (10-2017) Insomnia H/o polymyalgia rheumatica; used to see Dr Charlestine Night, T.A. Bx 2016 Palpable abdominal aorta: Abdominal ultrasound  09/2018 negative for AAA  PLAN Osteoporosis: Currently on calcium, vitamin D.    Recheck a bone density test, low threshold to start osteoporosis medication, she is a small frame and at risk of fractures.  She verbalized understanding. Occasional low BP: Her BP has been low a couple of times in the last few months, she does feel tired on and off. Recommend to continue monitoring BPs, if BP is slightly low need to rest drink plenty of fluids and recheck in few hours. Fatigue: Still feels tired  occasionally. Hyperlipidemia: Since the last visit, is doing well with diet and exercise, recheck labs,  willing to take medications only if cholesterol is "very high".  Otherwise she would prefer to continue working on lifestyle. Weight loss?  Weight in the scales at this office is slightly decreased, at home is a stable between 113 pounds and 115 pounds. RTC 6 months     This visit occurred during the SARS-CoV-2 public health emergency.  Safety protocols were in place, including screening questions prior to the visit, additional usage of staff PPE, and extensive cleaning of exam room while observing appropriate contact time as indicated for disinfecting solutions.

## 2019-12-01 NOTE — Patient Instructions (Addendum)
   GO TO THE LAB : Get the blood work     [employer], [place] back for a checkup in 6 months  Continue taking over-the-counter calcium and vitamin D.

## 2019-12-01 NOTE — Assessment & Plan Note (Signed)
Preventive care reviewed  Td: allergic; PNM 23:  2010 and 01-2019, PNM 13:  2015.  s/pt remote Zostavax. Had Covid vaccinations Last colonoscopy 2007, negative Cologuard 06/2017 Last mammogram 12/2018. No further Pap smears, last seen 2008.  She had a partial hysterectomy, still has a cervix.

## 2019-12-01 NOTE — Progress Notes (Signed)
Pre visit review using our clinic review tool, if applicable. No additional management support is needed unless otherwise documented below in the visit note. 

## 2019-12-02 NOTE — Assessment & Plan Note (Signed)
Osteoporosis: Currently on calcium, vitamin D.    Recheck a bone density test, low threshold to start osteoporosis medication, she is a small frame and at risk of fractures.  She verbalized understanding. Occasional low BP: Her BP has been low a couple of times in the last few months, she does feel tired on and off. Recommend to continue monitoring BPs, if BP is slightly low need to rest drink plenty of fluids and recheck in few hours. Fatigue: Still feels tired occasionally. Hyperlipidemia: Since the last visit, is doing well with diet and exercise, recheck labs,  willing to take medications only if cholesterol is "very high".  Otherwise she would prefer to continue working on lifestyle. Weight loss?  Weight in the scales at this office is slightly decreased, at home is a stable between 113 pounds and 115 pounds. RTC 6 months

## 2019-12-03 MED ORDER — ALENDRONATE SODIUM 70 MG PO TABS: 70.0000 mg | ORAL_TABLET | ORAL | 3 refills | Status: DC

## 2019-12-03 NOTE — Addendum Note (Signed)
Addended byDamita Dunnings D on: 12/03/2019 03:07 PM   Modules accepted: Orders

## 2019-12-04 ENCOUNTER — Encounter (INDEPENDENT_AMBULATORY_CARE_PROVIDER_SITE_OTHER): Payer: Medicare Other | Admitting: Ophthalmology

## 2019-12-04 ENCOUNTER — Other Ambulatory Visit: Payer: Self-pay

## 2019-12-04 DIAGNOSIS — H353121 Nonexudative age-related macular degeneration, left eye, early dry stage: Secondary | ICD-10-CM | POA: Diagnosis not present

## 2019-12-04 DIAGNOSIS — H43822 Vitreomacular adhesion, left eye: Secondary | ICD-10-CM | POA: Diagnosis not present

## 2019-12-04 DIAGNOSIS — H43813 Vitreous degeneration, bilateral: Secondary | ICD-10-CM

## 2019-12-05 ENCOUNTER — Other Ambulatory Visit: Payer: Self-pay

## 2019-12-05 MED ORDER — ALENDRONATE SODIUM 70 MG PO TABS: 70.0000 mg | ORAL_TABLET | ORAL | 3 refills | Status: DC

## 2019-12-08 ENCOUNTER — Telehealth: Payer: Self-pay | Admitting: Internal Medicine

## 2019-12-08 NOTE — Telephone Encounter (Signed)
Caller: Vonne  Call Back # [phone]  Patient states medication listed above was sent to two different place, patient states that she needs medication cancelled at [facility] so that [facility] can fill medication.   Pt is requesting a call back from providers Nurse.

## 2019-12-08 NOTE — Telephone Encounter (Signed)
Discussed at visit on 12/08/2019- I called [facility] to cancel and resent to [facility] as she requested.

## 2019-12-10 ENCOUNTER — Telehealth: Payer: Self-pay

## 2019-12-10 NOTE — Telephone Encounter (Signed)
Patient called in needing to speak with the nurse about her medication issue for alendronate (FOSAMAX) 70 MG tablet [id number]    Please follow up with the patient and advise at [phone]

## 2019-12-10 NOTE — Telephone Encounter (Signed)
Spoke w/ Pt- informed that [facility] is processing order for alendronate.

## 2019-12-10 NOTE — Telephone Encounter (Signed)
Spoke w/ Pt- she informed that she received an email from [facility] stating they could not fill alendronate- informed her that I did call [facility] on 12/05/2019 to cancel that prescription and sent to [facility]- I asked her if she has tried calling [facility]- she stated no.   I called [facility] and spoke w/ Truman Hayward, clinical pharmacist- informed of above- she was able to refill alendronate for Pt.

## 2019-12-25 ENCOUNTER — Other Ambulatory Visit: Payer: Self-pay

## 2019-12-25 ENCOUNTER — Ambulatory Visit (HOSPITAL_BASED_OUTPATIENT_CLINIC_OR_DEPARTMENT_OTHER)
Admission: RE | Admit: 2019-12-25 | Discharge: 2019-12-25 | Disposition: A | Discharge status: Home / Self Care | Payer: Medicare Other | Source: Ambulatory Visit | Attending: Internal Medicine | Admitting: Internal Medicine

## 2019-12-25 DIAGNOSIS — Z1231 Encounter for screening mammogram for malignant neoplasm of breast: Secondary | ICD-10-CM | POA: Insufficient documentation

## 2020-01-13 ENCOUNTER — Ambulatory Visit: Payer: Medicare Other

## 2020-04-22 DIAGNOSIS — Z85828 Personal history of other malignant neoplasm of skin: Secondary | ICD-10-CM | POA: Diagnosis not present

## 2020-04-22 DIAGNOSIS — L814 Other melanin hyperpigmentation: Secondary | ICD-10-CM | POA: Diagnosis not present

## 2020-04-22 DIAGNOSIS — Z86018 Personal history of other benign neoplasm: Secondary | ICD-10-CM | POA: Diagnosis not present

## 2020-04-22 DIAGNOSIS — D0472 Carcinoma in situ of skin of left lower limb, including hip: Secondary | ICD-10-CM | POA: Diagnosis not present

## 2020-04-22 DIAGNOSIS — L821 Other seborrheic keratosis: Secondary | ICD-10-CM | POA: Diagnosis not present

## 2020-04-22 DIAGNOSIS — L219 Seborrheic dermatitis, unspecified: Secondary | ICD-10-CM | POA: Diagnosis not present

## 2020-04-22 DIAGNOSIS — L578 Other skin changes due to chronic exposure to nonionizing radiation: Secondary | ICD-10-CM | POA: Diagnosis not present

## 2020-04-22 DIAGNOSIS — D485 Neoplasm of uncertain behavior of skin: Secondary | ICD-10-CM | POA: Diagnosis not present

## 2020-04-22 DIAGNOSIS — D225 Melanocytic nevi of trunk: Secondary | ICD-10-CM | POA: Diagnosis not present

## 2020-04-22 DIAGNOSIS — L570 Actinic keratosis: Secondary | ICD-10-CM | POA: Diagnosis not present

## 2020-04-26 DIAGNOSIS — Z23 Encounter for immunization: Secondary | ICD-10-CM | POA: Diagnosis not present

## 2020-05-24 DIAGNOSIS — H40013 Open angle with borderline findings, low risk, bilateral: Secondary | ICD-10-CM | POA: Diagnosis not present

## 2020-05-24 DIAGNOSIS — H532 Diplopia: Secondary | ICD-10-CM | POA: Diagnosis not present

## 2020-05-24 DIAGNOSIS — H353132 Nonexudative age-related macular degeneration, bilateral, intermediate dry stage: Secondary | ICD-10-CM | POA: Diagnosis not present

## 2020-05-24 DIAGNOSIS — H04123 Dry eye syndrome of bilateral lacrimal glands: Secondary | ICD-10-CM | POA: Diagnosis not present

## 2020-06-01 DIAGNOSIS — D0471 Carcinoma in situ of skin of right lower limb, including hip: Secondary | ICD-10-CM | POA: Diagnosis not present

## 2020-06-03 ENCOUNTER — Ambulatory Visit: Payer: Medicare Other | Admitting: Internal Medicine

## 2020-06-04 DIAGNOSIS — Z23 Encounter for immunization: Secondary | ICD-10-CM | POA: Diagnosis not present

## 2020-06-10 DIAGNOSIS — H40009 Preglaucoma, unspecified, unspecified eye: Secondary | ICD-10-CM | POA: Insufficient documentation

## 2020-06-16 ENCOUNTER — Encounter: Payer: Self-pay | Admitting: Internal Medicine

## 2020-06-16 ENCOUNTER — Other Ambulatory Visit: Payer: Self-pay

## 2020-06-16 ENCOUNTER — Ambulatory Visit (INDEPENDENT_AMBULATORY_CARE_PROVIDER_SITE_OTHER): Payer: Medicare Other | Admitting: Internal Medicine

## 2020-06-16 VITALS — BP 144/83 | HR 79 | Temp 97.7°F | Resp 16 | Ht 63.0 in | Wt 111.4 lb

## 2020-06-16 DIAGNOSIS — E7800 Pure hypercholesterolemia, unspecified: Secondary | ICD-10-CM

## 2020-06-16 DIAGNOSIS — G4700 Insomnia, unspecified: Secondary | ICD-10-CM | POA: Diagnosis not present

## 2020-06-16 DIAGNOSIS — M810 Age-related osteoporosis without current pathological fracture: Secondary | ICD-10-CM | POA: Diagnosis not present

## 2020-06-16 DIAGNOSIS — R5383 Other fatigue: Secondary | ICD-10-CM

## 2020-06-16 NOTE — Patient Instructions (Addendum)
Check the  blood pressure slightly. BP GOAL is between 110/65 and  135/85. If it is consistently higher or lower, let me know  GO TO THE FRONT DESK, PLEASE SCHEDULE YOUR APPOINTMENTS Come back for a checkup in 4 to 5 months    Denosumab injection What is this medicine? DENOSUMAB (den oh sue mab) slows bone breakdown. Prolia is used to treat osteoporosis in women after menopause and in men, and in people who are taking corticosteroids for 6 months or more. Delton See is used to treat a high calcium level due to cancer and to prevent bone fractures and other bone problems caused by multiple myeloma or cancer bone metastases. Delton See is also used to treat giant cell tumor of the bone. This medicine may be used for other purposes; ask your health care provider or pharmacist if you have questions. COMMON BRAND NAME(S): Prolia, XGEVA What should I tell my health care provider before I take this medicine? They need to know if you have any of these conditions:  dental disease  having surgery or tooth extraction  infection  kidney disease  low levels of calcium or Vitamin D in the blood  malnutrition  on hemodialysis  skin conditions or sensitivity  thyroid or parathyroid disease  an unusual reaction to denosumab, other medicines, foods, dyes, or preservatives  pregnant or trying to get pregnant  breast-feeding How should I use this medicine? This medicine is for injection under the skin. It is given by a health care professional in a hospital or clinic setting. A special MedGuide will be given to you before each treatment. Be sure to read this information carefully each time. For Prolia, talk to your pediatrician regarding the use of this medicine in children. Special care may be needed. For Delton See, talk to your pediatrician regarding the use of this medicine in children. While this drug may be prescribed for children as young as 13 years for selected conditions, precautions do  apply. Overdosage: If you think you have taken too much of this medicine contact a poison control center or emergency room at once. NOTE: This medicine is only for you. Do not share this medicine with others. What if I miss a dose? It is important not to miss your dose. Call your doctor or health care professional if you are unable to keep an appointment. What may interact with this medicine? Do not take this medicine with any of the following medications:  other medicines containing denosumab This medicine may also interact with the following medications:  medicines that lower your chance of fighting infection  steroid medicines like prednisone or cortisone This list may not describe all possible interactions. Give your health care provider a list of all the medicines, herbs, non-prescription drugs, or dietary supplements you use. Also tell them if you smoke, drink alcohol, or use illegal drugs. Some items may interact with your medicine. What should I watch for while using this medicine? Visit your doctor or health care professional for regular checks on your progress. Your doctor or health care professional may order blood tests and other tests to see how you are doing. Call your doctor or health care professional for advice if you get a fever, chills or sore throat, or other symptoms of a cold or flu. Do not treat yourself. This drug may decrease your body's ability to fight infection. Try to avoid being around people who are sick. You should make sure you get enough calcium and vitamin D while you are taking  this medicine, unless your doctor tells you not to. Discuss the foods you eat and the vitamins you take with your health care professional. See your dentist regularly. Brush and floss your teeth as directed. Before you have any dental work done, tell your dentist you are receiving this medicine. Do not become pregnant while taking this medicine or for 5 months after stopping it. Talk  with your doctor or health care professional about your birth control options while taking this medicine. Women should inform their doctor if they wish to become pregnant or think they might be pregnant. There is a potential for serious side effects to an unborn child. Talk to your health care professional or pharmacist for more information. What side effects may I notice from receiving this medicine? Side effects that you should report to your doctor or health care professional as soon as possible:  allergic reactions like skin rash, itching or hives, swelling of the face, lips, or tongue  bone pain  breathing problems  dizziness  jaw pain, especially after dental work  redness, blistering, peeling of the skin  signs and symptoms of infection like fever or chills; cough; sore throat; pain or trouble passing urine  signs of low calcium like fast heartbeat, muscle cramps or muscle pain; pain, tingling, numbness in the hands or feet; seizures  unusual bleeding or bruising  unusually weak or tired Side effects that usually do not require medical attention (report to your doctor or health care professional if they continue or are bothersome):  constipation  diarrhea  headache  joint pain  loss of appetite  muscle pain  runny nose  tiredness  upset stomach This list may not describe all possible side effects. Call your doctor for medical advice about side effects. You may report side effects to FDA at 1-800-FDA-1088. Where should I keep my medicine? This medicine is only given in a clinic, doctor's office, or other health care setting and will not be stored at home. NOTE: This sheet is a summary. It may not cover all possible information. If you have questions about this medicine, talk to your doctor, pharmacist, or health care provider.  2020 Elsevier/Gold Standard (2017-11-16 16:10:44)

## 2020-06-16 NOTE — Progress Notes (Signed)
Subjective:    Patient ID: Savannah Jimenez, female    DOB: May 30, 1936, 84 y.o.   MRN: [id number]  DOS:  06/16/2020 Type of visit - description: Routine visit Since the last office visit he is feeling about the same. Her only concern is fatigue, a chronic issue, not getting worse or better.  Again denies fever chills.  No headache. Weight loss has stabilized. No chest pain no difficulty breathing.  Wt Readings from Last 3 Encounters:  06/16/20 111 lb 6 oz (50.5 kg)  12/01/19 110 lb 6 oz (50.1 kg)  01/29/19 114 lb (51.7 kg)     Review of Systems See above   Past Medical History:  Diagnosis Date  . Allergy   . Arthritis   . GERD (gastroesophageal reflux disease)   . Glaucoma suspect   . HOH (hard of hearing)   . Osteoporosis     Past Surgical History:  Procedure Laterality Date  . ABDOMINAL HYSTERECTOMY     still have cervix  . ARTERY BIOPSY Right 10/06/2014   Procedure: BIOPSY TEMPORAL ARTERY;  Surgeon: Armandina Gemma, MD;  Location: [facility];  Service: General;  Laterality: Right;  . BREAST EXCISIONAL BIOPSY Right    over 20 years ago; lymph nodes removed  . CATARACT EXTRACTION  10/2011   Left  . colonscopy  2007  . INNER EAR SURGERY  1974   both stapes replaced-steal  . TUBAL LIGATION      Allergies as of 06/16/2020      Reactions   Tetanus Toxoids    Arm swelling    Penicillins    Sulfacetamide Sodium       Medication List       Accurate as of June 16, 2020 11:59 PM. If you have any questions, ask your nurse or doctor.        alendronate 70 MG tablet Commonly known as: FOSAMAX Take 1 tablet (70 mg total) by mouth once a week. Take with a full glass of water on an empty stomach.   B-12 1000 MCG Tabs Take by mouth.   calcium citrate-vitamin D 315-200 MG-UNIT tablet Commonly known as: CITRACAL+D Take 1 tablet by mouth daily.   fish oil-omega-3 fatty acids 1000 MG capsule Take 2 g by mouth daily.   fluticasone 50 MCG/ACT nasal  spray Commonly known as: FLONASE SHAKE WELL AND USE 2 SPRAYS IN EACH NOSTRIL DAILY What changed: See the new instructions.   melatonin 3 MG Tabs tablet Take 1 tablet by mouth at bedtime.   PreserVision AREDS 2 Caps Take 1 capsule by mouth 2 (two) times daily.   zolpidem 5 MG tablet Commonly known as: AMBIEN Take 1 tablet (5 mg total) by mouth at bedtime as needed.          Objective:   Physical Exam BP (!) 144/83 (BP Location: Left Arm, Patient Position: Sitting, Cuff Size: Small)   Pulse 79   Temp 97.7 F (36.5 C) (Oral)   Resp 16   Ht 5\' 3"  (1.6 m)   Wt 111 lb 6 oz (50.5 kg)   SpO2 97%   BMI 19.73 kg/m  General:   Well developed, NAD, BMI noted. HEENT:  Normocephalic . Face symmetric, atraumatic Lungs:  CTA B Normal respiratory effort, no intercostal retractions, no accessory muscle use. Heart: RRR,  no murmur.  Lower extremities: no pretibial edema bilaterally  Skin: Not pale. Not jaundice Neurologic:  alert & oriented X3.  Speech normal, gait appropriate for age  and unassisted Psych--  Cognition and judgment appear intact.  Cooperative with normal attention span and concentration.  Behavior appropriate. No anxious or depressed appearing.      Assessment     Assessment, transferring from Dr. Lianne Cure (01/2018)  Allergies GERD Osteoporosis: Had actonel before  -Tscore  -2.2 (10-2017) Insomnia H/o polymyalgia rheumatica; used to see Dr Charlestine Night, [facility]. Bx 2016 Palpable abdominal aorta: Abdominal ultrasound  09/2018 negative for AAA B12 deficiency   PLAN Fatigue: Continue to report fatigue, simply lack of energy, ROS negative. Previously sed rates were normal, normal vitamins levels, no anemia, no thyroid disease or diabetes. For now recommend observation.  See next Occasional low BP: Ambulatory BP historically has been in the 120s, she started cooking low salt (husband dx HTN) and  since then BP is in the low side.  Rec to allow herself a little more  salt and monitor BPs, hopefully it'll  go up in the 120s and perhaps that may help fatigue. Hyperlipidemia: Labs from May 2021 showed improved cholesterol, currently on diet control Osteoporosis: T score -2.3, (May 2021), Rx Fosamax, good compliance and tolerance but she is  concerned about taking it.  Alternative will be Prolia, patient will read about it. Weight loss: stabilized  Preventive care: Had COVID booster and flu shot Insomnia: Relatively well controlled on melatonin nightly and Ambien as needed RTC 5 months routine checkup  This visit occurred during the SARS-CoV-2 public health emergency.  Safety protocols were in place, including screening questions prior to the visit, additional usage of staff PPE, and extensive cleaning of exam room while observing appropriate contact time as indicated for disinfecting solutions.

## 2020-06-16 NOTE — Progress Notes (Signed)
Pre visit review using our clinic review tool, if applicable. No additional management support is needed unless otherwise documented below in the visit note. 

## 2020-06-19 DIAGNOSIS — G4700 Insomnia, unspecified: Secondary | ICD-10-CM | POA: Insufficient documentation

## 2020-06-19 NOTE — Assessment & Plan Note (Signed)
Fatigue: Continue to report fatigue, simply lack of energy, ROS negative. Previously sed rates were normal, normal vitamins levels, no anemia, no thyroid disease or diabetes. For now recommend observation.  See next Occasional low BP: Ambulatory BP historically has been in the 120s, she started cooking low salt (husband dx HTN) and  since then BP is in the low side.  Rec to allow herself a little more salt and monitor BPs, hopefully it'll  go up in the 120s and perhaps that may help fatigue. Hyperlipidemia: Labs from May 2021 showed improved cholesterol, currently on diet control Osteoporosis: T score -2.3, (May 2021), Rx Fosamax, good compliance and tolerance but she is  concerned about taking it.  Alternative will be Prolia, patient will read about it. Weight loss: stabilized  Preventive care: Had COVID booster and flu shot Insomnia: Relatively well controlled on melatonin nightly and Ambien as needed RTC 5 months routine checkup

## 2020-07-07 NOTE — Progress Notes (Addendum)
Subjective:   SADIYA DURAND is a 84 y.o. female who presents for Medicare Annual (Subsequent) preventive examination.  Review of Systems     Cardiac Risk Factors include: advanced age (>96men, >1 women)     Objective:    Today's Vitals   07/08/20 1021  BP: 138/70  Pulse: (!) 57  Resp: 16  Temp: 98.1 F (36.7 C)  TempSrc: Oral  SpO2: 98%  Weight: 113 lb 12.8 oz (51.6 kg)  Height: 5\' 3"  (1.6 m)   Body mass index is 20.16 kg/m.  Advanced Directives 07/08/2020 07/07/2019 07/04/2018 10/06/2014 10/02/2014  Does Patient Have a Medical Advance Directive? Yes Yes Yes Yes Yes  Type of Paramedic of [place];Living will [place];Living will [place];Living will - [place];Living will  Does patient want to make changes to medical advance directive? - No - Patient declined - - No - Patient declined  Copy of [place] in Chart? No - copy requested Yes - validated most recent copy scanned in chart (See row information) Yes - validated most recent copy scanned in chart (See row information) No - copy requested -    Current Medications (verified) Outpatient Encounter Medications as of 07/08/2020  Medication Sig  . alendronate (FOSAMAX) 70 MG tablet Take 1 tablet (70 mg total) by mouth once a week. Take with a full glass of water on an empty stomach.  . calcium citrate-vitamin D (CITRACAL+D) 315-200 MG-UNIT tablet Take 1 tablet by mouth daily.  . Cyanocobalamin (B-12) 1000 MCG TABS Take by mouth.  . fish oil-omega-3 fatty acids 1000 MG capsule Take 2 g by mouth daily.  . fluticasone (FLONASE) 50 MCG/ACT nasal spray SHAKE WELL AND USE 2 SPRAYS IN EACH NOSTRIL DAILY  . Melatonin 3 MG TABS Take 1 tablet by mouth at bedtime.  . Multiple Vitamins-Minerals (PRESERVISION AREDS 2) CAPS Take 1 capsule by mouth 2 (two) times daily.   Marland Kitchen zolpidem (AMBIEN) 5 MG tablet Take 1 tablet (5 mg total) by  mouth at bedtime as needed.   No facility-administered encounter medications on file as of 07/08/2020.    Allergies (verified) Tetanus toxoids, Penicillins, and Sulfacetamide sodium   History: Past Medical History:  Diagnosis Date  . Allergy   . Arthritis   . GERD (gastroesophageal reflux disease)   . Glaucoma suspect   . HOH (hard of hearing)   . Osteoporosis    Past Surgical History:  Procedure Laterality Date  . ABDOMINAL HYSTERECTOMY     still have cervix  . ARTERY BIOPSY Right 10/06/2014   Procedure: BIOPSY TEMPORAL ARTERY;  Surgeon: Armandina Gemma, MD;  Location: [place];  Service: General;  Laterality: Right;  . BREAST EXCISIONAL BIOPSY Right    over 20 years ago; lymph nodes removed  . CATARACT EXTRACTION  10/2011   Left  . colonscopy  2007  . INNER EAR SURGERY  1974   both stapes replaced-steal  . TUBAL LIGATION     Family History  Problem Relation Age of Onset  . Heart failure Mother   . Hypertension Sister   . Asthma Sister   . Hypertension Brother    Social History   Socioeconomic History  . Marital status: Married    Spouse name: Not on file  . Number of children: 2  . Years of education: Not on file  . Highest education level: Not on file  Occupational History  . Occupation: retired ,  day care  Tobacco Use  . Smoking status: Never Smoker  . Smokeless tobacco: Never Used  Substance and Sexual Activity  . Alcohol use: Yes    Comment: rarely  . Drug use: No  . Sexual activity: Not on file  Other Topics Concern  . Not on file  Social History Narrative   2 g- children   2 g-g- children   Social Determinants of Health   Financial Resource Strain: Low Risk   . Difficulty of Paying Living Expenses: Not hard at all  Food Insecurity: No Food Insecurity  . Worried About Charity fundraiser in the Last Year: Never true  . Ran Out of Food in the Last Year: Never true  Transportation Needs: No Transportation Needs  . Lack of  Transportation (Medical): No  . Lack of Transportation (Non-Medical): No  Physical Activity: Sufficiently Active  . Days of Exercise per Week: 3 days  . Minutes of Exercise per Session: 50 min  Stress: No Stress Concern Present  . Feeling of Stress : Not at all  Social Connections: Moderately Integrated  . Frequency of Communication with Friends and Family: More than three times a week  . Frequency of Social Gatherings with Friends and Family: More than three times a week  . Attends Religious Services: More than 4 times per year  . Active Member of Clubs or Organizations: No  . Attends Archivist Meetings: Never  . Marital Status: Married    Tobacco Counseling Counseling given: Not Answered   Clinical Intake:  Pre-visit preparation completed: Yes  Pain : No/denies pain     Nutritional Status: BMI of 19-24  Normal Nutritional Risks: None Diabetes: No  How often do you need to have someone help you when you read instructions, pamphlets, or other written materials from your doctor or pharmacy?: 1 - Never What is the last grade level you completed in school?: [employer]  Diabetic?No  Interpreter Needed?: No  Information entered by :: Caroleen Hamman LPN   Activities of Daily Living In your present state of health, do you have any difficulty performing the following activities: 07/08/2020  Hearing? N  Vision? N  Difficulty concentrating or making decisions? N  Walking or climbing stairs? N  Dressing or bathing? N  Doing errands, shopping? N  Preparing Food and eating ? N  Using the Toilet? N  In the past six months, have you accidently leaked urine? N  Do you have problems with loss of bowel control? N  Managing your Medications? N  Managing your Finances? N  Housekeeping or managing your Housekeeping? N  Some recent data might be hidden    Patient Care Team: Colon Branch, MD as PCP - General (Internal Medicine) Hayden Pedro, MD as Consulting  Physician (Ophthalmology) Hortencia Pilar, MD as Consulting Physician (Ophthalmology)  Indicate any recent Medical Services you may have received from other than [facility] providers in the past year (date may be approximate).     Assessment:   This is a routine wellness examination for [place].  Hearing/Vision screen  Hearing Screening   125Hz  250Hz  500Hz  1000Hz  2000Hz  3000Hz  4000Hz  6000Hz  8000Hz   Right ear:           Left ear:           Comments: Hearing aids  Vision Screening Comments: Reading glasses Last eye exam-2-3 months ago-Dr. Kathlen Mody  Dietary issues and exercise activities discussed: Current Exercise Habits: Home exercise routine, Type of exercise: strength  training/weights;walking, Time (Minutes): 45, Frequency (Times/Week): 3, Weekly Exercise (Minutes/Week): 135, Intensity: Mild  Goals    . Patient Stated     Maintain current health and independence.     . Patient Stated     Drink more water      Depression Screen PHQ 2/9 Scores 07/08/2020 07/07/2019 07/04/2018 01/28/2018 11/22/2016 06/23/2016 06/21/2015  PHQ - 2 Score 0 0 0 0 0 0 0    Fall Risk Fall Risk  07/08/2020 07/07/2019 07/04/2018 01/28/2018 11/22/2016  Falls in the past year? 0 0 0 No No  Number falls in past yr: 0 - - - -  Injury with Fall? 0 - - - -  Follow up Falls prevention discussed Education provided;Falls prevention discussed - - -    FALL RISK PREVENTION PERTAINING TO THE HOME:  Any stairs in or around the home? No  Home free of loose throw rugs in walkways, pet beds, electrical cords, etc? Yes  Adequate lighting in your home to reduce risk of falls? Yes   ASSISTIVE DEVICES UTILIZED TO PREVENT FALLS:  Life alert? No  Use of a cane, walker or w/c? No  Grab bars in the bathroom? Yes  Shower chair or bench in shower? No  Elevated toilet seat or a handicapped toilet? No   TIMED UP AND GO:  Was the test performed? Yes .  Length of time to ambulate 10 feet: 10 sec.   Gait steady and fast  without use of assistive device  Cognitive Function:Normal cognitive status assessed by direct observation by this Nurse Health Advisor. No abnormalities found.       6CIT Screen 07/08/2020  What Year? 0 points  What month? 0 points  What time? 0 points  Count back from 20 0 points  Months in reverse 0 points    Immunizations Immunization History  Administered Date(s) Administered  . Influenza Whole 04/05/2010  . Influenza, High Dose Seasonal PF 04/30/2014, 05/04/2015, 04/25/2016, 04/11/2019  . Influenza-Unspecified 05/01/2017, 04/25/2018, 04/23/2020  . PFIZER SARS-COV-2 Vaccination 08/13/2019, 09/01/2019, 06/04/2020  . Pneumococcal Conjugate-13 06/03/2014  . Pneumococcal Polysaccharide-23 04/19/2009, 01/29/2019  . Zoster 11/04/2007, 12/24/2009    TDAP status: Patient is allergic.  Flu Vaccine status: Up to date  Pneumococcal vaccine status: Up to date  Covid-19 vaccine status: Completed vaccines  Qualifies for Shingles Vaccine? Yes   Zostavax completed Yes   Shingrix Completed?: No.    Education has been provided regarding the importance of this vaccine. Patient has been advised to call insurance company to determine out of pocket expense if they have not yet received this vaccine. Advised may also receive vaccine at local pharmacy or Health Dept. Verbalized acceptance and understanding.  Screening Tests Health Maintenance  Topic Date Due  . TETANUS/TDAP  05/30/2021  . INFLUENZA VACCINE  Completed  . DEXA SCAN  Completed  . COVID-19 Vaccine  Completed  . PNA vac Low Risk Adult  Completed    Health Maintenance  There are no preventive care reminders to display for this patient.  Colorectal cancer screening: No longer required.   Mammogram status: Completed Bilateral 12/25/2019. Repeat every year  Bone Density status: Completed 12/01/2019. Results reflect: Bone density results: OSTEOPENIA. Repeat every 2 years.  Lung Cancer Screening: (Low Dose CT Chest  recommended if Age 61-80 years, 30 pack-year currently smoking OR have quit w/in 15years.) does not qualify.     Additional Screening:  Hepatitis C Screening: does not qualify  Vision Screening: Recommended annual ophthalmology exams for early detection  of glaucoma and other disorders of the eye. Is the patient up to date with their annual eye exam?  Yes  Who is the provider or what is the name of the office in which the patient attends annual eye exams? Dr. Kathlen Mody   Dental Screening: Recommended annual dental exams for proper oral hygiene  Community Resource Referral / Chronic Care Management: CRR required this visit?  No   CCM required this visit?  No      Plan:     I have personally reviewed and noted the following in the patient's chart:   . Medical and social history . Use of alcohol, tobacco or illicit drugs  . Current medications and supplements . Functional ability and status . Nutritional status . Physical activity . Advanced directives . List of other physicians . Hospitalizations, surgeries, and ER visits in previous 12 months . Vitals . Screenings to include cognitive, depression, and falls . Referrals and appointments  In addition, I have reviewed and discussed with patient certain preventive protocols, quality metrics, and best practice recommendations. A written personalized care plan for preventive services as well as general preventive health recommendations were provided to patient.   Patient to access avs via my chart.  Marta Antu, LPN   [id number]  Nurse Health Advisor  Nurse Notes: None  Kathlene November, MD

## 2020-07-08 ENCOUNTER — Other Ambulatory Visit: Payer: Self-pay

## 2020-07-08 ENCOUNTER — Ambulatory Visit (INDEPENDENT_AMBULATORY_CARE_PROVIDER_SITE_OTHER): Payer: Medicare Other

## 2020-07-08 ENCOUNTER — Ambulatory Visit: Payer: Self-pay | Admitting: *Deleted

## 2020-07-08 VITALS — BP 138/70 | HR 57 | Temp 98.1°F | Resp 16 | Ht 63.0 in | Wt 113.8 lb

## 2020-07-08 DIAGNOSIS — Z0000 Encounter for general adult medical examination without abnormal findings: Secondary | ICD-10-CM | POA: Diagnosis not present

## 2020-07-08 NOTE — Patient Instructions (Signed)
Savannah Jimenez , Thank you for taking time to come for your Medicare Wellness Visit. I appreciate your ongoing commitment to your health goals. Please review the following plan we discussed and let me know if I can assist you in the future.   Screening recommendations/referrals: Colonoscopy: No longer required Mammogram: Completed 12/25/2019-Due 12/24/2020 Bone Density: Completed 12/01/2019-Due 11/30/2021 Recommended yearly ophthalmology/optometry visit for glaucoma screening and checkup Recommended yearly dental visit for hygiene and checkup  Vaccinations: Influenza vaccine: Up to date Pneumococcal vaccine: Completed vaccines Tdap vaccine: Up to date- Due-05/30/2021 Shingles vaccine: Discuss with pharmacy Covid-19:Completed vaccines  Advanced directives: Please bring a copy for your chart  Conditions/risks identified: See problem list  Next appointment: Follow up in one year for your annual wellness visit 07/21/2021 @ 10:30   Preventive Care 65 Years and Older, Female Preventive care refers to lifestyle choices and visits with your health care provider that can promote health and wellness. What does preventive care include?  A yearly physical exam. This is also called an annual well check.  Dental exams once or twice a year.  Routine eye exams. Ask your health care provider how often you should have your eyes checked.  Personal lifestyle choices, including:  Daily care of your teeth and gums.  Regular physical activity.  Eating a healthy diet.  Avoiding tobacco and drug use.  Limiting alcohol use.  Practicing safe sex.  Taking low-dose aspirin every day.  Taking vitamin and mineral supplements as recommended by your health care provider. What happens during an annual well check? The services and screenings done by your health care provider during your annual well check will depend on your age, overall health, lifestyle risk factors, and family history of disease. Counseling   Your health care provider may ask you questions about your:  Alcohol use.  Tobacco use.  Drug use.  Emotional well-being.  Home and relationship well-being.  Sexual activity.  Eating habits.  History of falls.  Memory and ability to understand (cognition).  Work and work Statistician.  Reproductive health. Screening  You may have the following tests or measurements:  Height, weight, and BMI.  Blood pressure.  Lipid and cholesterol levels. These may be checked every 5 years, or more frequently if you are over 52 years old.  Skin check.  Lung cancer screening. You may have this screening every year starting at age 69 if you have a 30-pack-year history of smoking and currently smoke or have quit within the past 15 years.  Fecal occult blood test (FOBT) of the stool. You may have this test every year starting at age 48.  Flexible sigmoidoscopy or colonoscopy. You may have a sigmoidoscopy every 5 years or a colonoscopy every 10 years starting at age 52.  Hepatitis C blood test.  Hepatitis B blood test.  Sexually transmitted disease (STD) testing.  Diabetes screening. This is done by checking your blood sugar (glucose) after you have not eaten for a while (fasting). You may have this done every 1-3 years.  Bone density scan. This is done to screen for osteoporosis. You may have this done starting at age 62.  Mammogram. This may be done every 1-2 years. Talk to your health care provider about how often you should have regular mammograms. Talk with your health care provider about your test results, treatment options, and if necessary, the need for more tests. Vaccines  Your health care provider may recommend certain vaccines, such as:  Influenza vaccine. This is recommended every year.  Tetanus, diphtheria, and acellular pertussis (Tdap, Td) vaccine. You may need a Td booster every 10 years.  Zoster vaccine. You may need this after age 89.  Pneumococcal 13-valent  conjugate (PCV13) vaccine. One dose is recommended after age 39.  Pneumococcal polysaccharide (PPSV23) vaccine. One dose is recommended after age 42. Talk to your health care provider about which screenings and vaccines you need and how often you need them. This information is not intended to replace advice given to you by your health care provider. Make sure you discuss any questions you have with your health care provider. Document Released: 08/06/2015 Document Revised: 03/29/2016 Document Reviewed: 05/11/2015 Elsevier Interactive Patient Education  2017 [employer] Prevention in the Home Falls can cause injuries. They can happen to people of all ages. There are many things you can do to make your home safe and to help prevent falls. What can I do on the outside of my home?  Regularly fix the edges of walkways and driveways and fix any cracks.  Remove anything that might make you trip as you walk through a door, such as a raised step or threshold.  Trim any bushes or trees on the path to your home.  Use bright outdoor lighting.  Clear any walking paths of anything that might make someone trip, such as rocks or tools.  Regularly check to see if handrails are loose or broken. Make sure that both sides of any steps have handrails.  Any raised decks and porches should have guardrails on the edges.  Have any leaves, snow, or ice cleared regularly.  Use sand or salt on walking paths during winter.  Clean up any spills in your garage right away. This includes oil or grease spills. What can I do in the bathroom?  Use night lights.  Install grab bars by the toilet and in the tub and shower. Do not use towel bars as grab bars.  Use non-skid mats or decals in the tub or shower.  If you need to sit down in the shower, use a plastic, non-slip stool.  Keep the floor dry. Clean up any water that spills on the floor as soon as it happens.  Remove soap buildup in the tub or  shower regularly.  Attach bath mats securely with double-sided non-slip rug tape.  Do not have throw rugs and other things on the floor that can make you trip. What can I do in the bedroom?  Use night lights.  Make sure that you have a light by your bed that is easy to reach.  Do not use any sheets or blankets that are too big for your bed. They should not hang down onto the floor.  Have a firm chair that has side arms. You can use this for support while you get dressed.  Do not have throw rugs and other things on the floor that can make you trip. What can I do in the kitchen?  Clean up any spills right away.  Avoid walking on wet floors.  Keep items that you use a lot in easy-to-reach places.  If you need to reach something above you, use a strong step stool that has a grab bar.  Keep electrical cords out of the way.  Do not use floor polish or wax that makes floors slippery. If you must use wax, use non-skid floor wax.  Do not have throw rugs and other things on the floor that can make you trip. What can I do  with my stairs?  Do not leave any items on the stairs.  Make sure that there are handrails on both sides of the stairs and use them. Fix handrails that are broken or loose. Make sure that handrails are as long as the stairways.  Check any carpeting to make sure that it is firmly attached to the stairs. Fix any carpet that is loose or worn.  Avoid having throw rugs at the top or bottom of the stairs. If you do have throw rugs, attach them to the floor with carpet tape.  Make sure that you have a light switch at the top of the stairs and the bottom of the stairs. If you do not have them, ask someone to add them for you. What else can I do to help prevent falls?  Wear shoes that:  Do not have high heels.  Have rubber bottoms.  Are comfortable and fit you well.  Are closed at the toe. Do not wear sandals.  If you use a stepladder:  Make sure that it is fully  opened. Do not climb a closed stepladder.  Make sure that both sides of the stepladder are locked into place.  Ask someone to hold it for you, if possible.  Clearly mark and make sure that you can see:  Any grab bars or handrails.  First and last steps.  Where the edge of each step is.  Use tools that help you move around (mobility aids) if they are needed. These include:  Canes.  Walkers.  Scooters.  Crutches.  Turn on the lights when you go into a dark area. Replace any light bulbs as soon as they burn out.  Set up your furniture so you have a clear path. Avoid moving your furniture around.  If any of your floors are uneven, fix them.  If there are any pets around you, be aware of where they are.  Review your medicines with your doctor. Some medicines can make you feel dizzy. This can increase your chance of falling. Ask your doctor what other things that you can do to help prevent falls. This information is not intended to replace advice given to you by your health care provider. Make sure you discuss any questions you have with your health care provider. Document Released: 05/06/2009 Document Revised: 12/16/2015 Document Reviewed: 08/14/2014 Elsevier Interactive Patient Education  2017 [employer].

## 2020-09-16 ENCOUNTER — Other Ambulatory Visit (HOSPITAL_BASED_OUTPATIENT_CLINIC_OR_DEPARTMENT_OTHER): Payer: Self-pay | Admitting: Internal Medicine

## 2020-09-16 DIAGNOSIS — Z1231 Encounter for screening mammogram for malignant neoplasm of breast: Secondary | ICD-10-CM

## 2020-10-27 ENCOUNTER — Other Ambulatory Visit: Payer: Self-pay | Admitting: Internal Medicine

## 2020-11-12 ENCOUNTER — Other Ambulatory Visit: Payer: Self-pay

## 2020-11-12 ENCOUNTER — Encounter (HOSPITAL_BASED_OUTPATIENT_CLINIC_OR_DEPARTMENT_OTHER): Payer: Self-pay | Admitting: Emergency Medicine

## 2020-11-12 ENCOUNTER — Telehealth: Payer: Self-pay

## 2020-11-12 ENCOUNTER — Emergency Department (HOSPITAL_BASED_OUTPATIENT_CLINIC_OR_DEPARTMENT_OTHER)
Admission: EM | Admit: 2020-11-12 | Discharge: 2020-11-12 | Disposition: A | Discharge status: Home / Self Care | Payer: Medicare Other | Attending: Emergency Medicine | Admitting: Emergency Medicine

## 2020-11-12 DIAGNOSIS — K921 Melena: Secondary | ICD-10-CM | POA: Diagnosis not present

## 2020-11-12 DIAGNOSIS — R195 Other fecal abnormalities: Secondary | ICD-10-CM | POA: Diagnosis not present

## 2020-11-12 LAB — OCCULT BLOOD X 1 CARD TO LAB, STOOL: Fecal Occult Bld: NEGATIVE

## 2020-11-12 LAB — COMPREHENSIVE METABOLIC PANEL
ALT: 20 U/L (ref 0–44)
AST: 28 U/L (ref 15–41)
Albumin: 3.9 g/dL (ref 3.5–5.0)
Alkaline Phosphatase: 53 U/L (ref 38–126)
Anion gap: 10 (ref 5–15)
BUN: 15 mg/dL (ref 8–23)
CO2: 25 mmol/L (ref 22–32)
Calcium: 9.1 mg/dL (ref 8.9–10.3)
Chloride: 104 mmol/L (ref 98–111)
Creatinine, Ser: 0.67 mg/dL (ref 0.44–1.00)
GFR, Estimated: >60 mL/min (ref >60)
Glucose, Bld: 107 mg/dL — ABNORMAL HIGH (ref 70–99)
Potassium: 3.8 mmol/L (ref 3.5–5.1)
Sodium: 139 mmol/L (ref 135–145)
Total Bilirubin: 0.7 mg/dL (ref 0.3–1.2)
Total Protein: 7.1 g/dL (ref 6.5–8.1)

## 2020-11-12 LAB — CBC WITH DIFFERENTIAL/PLATELET
Abs Immature Granulocytes: 0.01 10*3/uL (ref 0.00–0.07)
Basophils Absolute: 0 10*3/uL (ref 0.0–0.1)
Basophils Relative: 0 %
Eosinophils Absolute: 0 10*3/uL (ref 0.0–0.5)
Eosinophils Relative: 1 %
HCT: 40.3 % (ref 36.0–46.0)
Hemoglobin: 12.9 g/dL (ref 12.0–15.0)
Immature Granulocytes: 0 %
Lymphocytes Relative: 29 %
Lymphs Abs: 1.5 10*3/uL (ref 0.7–4.0)
MCH: 31 pg (ref 26.0–34.0)
MCHC: 32 g/dL (ref 30.0–36.0)
MCV: 96.9 fL (ref 80.0–100.0)
Monocytes Absolute: 0.4 10*3/uL (ref 0.1–1.0)
Monocytes Relative: 8 %
Neutro Abs: 3.2 10*3/uL (ref 1.7–7.7)
Neutrophils Relative %: 62 %
Platelets: 213 10*3/uL (ref 150–400)
RBC: 4.16 MIL/uL (ref 3.87–5.11)
RDW: 13.2 % (ref 11.5–15.5)
WBC: 5.1 10*3/uL (ref 4.0–10.5)
nRBC: 0 % (ref 0.0–0.2)

## 2020-11-12 LAB — PROTIME-INR
INR: 0.9 (ref 0.8–1.2)
Prothrombin Time: 12.1 seconds (ref 11.4–15.2)

## 2020-11-12 NOTE — Telephone Encounter (Signed)
Nurse Assessment Nurse: Micki Riley, RN, Domenick Gong Date/Time (Eastern Time): 11/12/2020 8:22:38 AM Confirm and document reason for call. If symptomatic, describe symptoms. ---Caller states she has noted blood in her stools/ diarrhea (3x); first noted yesterday afternoon. Denies any other symptoms. Denies currently taking any blood thinners. Has an appt Monday morning. Does the patient have any new or worsening symptoms? ---Yes Will a triage be completed? ---Yes Related visit to physician within the last 2 weeks? ---N/A Does the PT have any chronic conditions? (i.e. diabetes, asthma, this includes High risk factors for pregnancy, etc.) ---Unknown Is this a behavioral health or substance abuse call? ---No Guidelines Guideline Title Affirmed Question Affirmed Notes Nurse Date/Time (Eastern Time) Rectal Bleeding [1] MODERATE rectal bleeding (small blood clots, passing blood without stool, or toilet water turns red) AND [2] more than once a day Cazares, RN, Domenick Gong 11/12/2020 8:25:04 AM Disp. Time Eilene Ghazi Time) Disposition Final User 11/12/2020 8:27:26 AM Go to ED Now Yes Cazares, RN, Domenick Gong PLEASE NOTE: All timestamps contained within this report are represented as [place] Standard Time. CONFIDENTIALTY NOTICE: This fax transmission is intended only for the addressee. It contains information that is legally privileged, confidential or otherwise protected from use or disclosure. If you are not the intended recipient, you are strictly prohibited from reviewing, disclosing, copying using or disseminating any of this information or taking any action in reliance on or regarding this information. If you have received this fax in error, please notify us immediately by telephone so that we can arrange for its return to [place]. Phone: [phone], Toll-Free: [phone], Fax: [phone] Page: 2 of 2 Call Id: [id number] [place] Disagree/Comply Comply Caller Understands  Yes PreDisposition InappropriateToAsk Care Advice Given Per Guideline GO TO ED NOW: NOTE TO TRIAGER - DRIVING: * Another adult should drive. CARE ADVICE given per Rectal Bleeding (Adult) guideline. BRING MEDICINES: Referrals GO TO FACILITY OTHER - SPECIFY

## 2020-11-12 NOTE — Telephone Encounter (Signed)
Noted, thank you

## 2020-11-12 NOTE — ED Provider Notes (Signed)
[facility] EMERGENCY DEPARTMENT Provider Note   CSN: [id number] Arrival date & time: 11/12/20  [id number]     History Chief Complaint  Patient presents with  . Melena    Savannah Jimenez is a 85 y.o. female.  HPI   Patient presented to the ED for evaluation of dark stools.  Patient states she started having episodes last evening.  She has had a few since then.  Stool is very dark and black.  Patient states she has not noticed any bright red blood in her stool.  She has not had any vomiting.  No nausea.  No dizziness.  Patient denies any abdominal pain.  She denies having prior history of GI bleeding but is concerned that this might be blood in her stool  Past Medical History:  Diagnosis Date  . Allergy   . Arthritis   . GERD (gastroesophageal reflux disease)   . Glaucoma suspect   . HOH (hard of hearing)   . Osteoporosis     Patient Active Problem List   Diagnosis Date Noted  . Insomnia 06/19/2020  . Glaucoma suspect 06/10/2020  . Annual physical exam 06/05/2018  . Vitamin B 12 deficiency 06/05/2018  . PCP NOTES >>>>>>>>>>>>>>> 01/29/2018  . Right temporal headache 10/05/2014  . Polymyalgia rheumatica ([place]) 05/18/2011  . Allergic rhinitis 03/22/2007  . GERD 03/22/2007  . Osteoporosis 03/22/2007    Past Surgical History:  Procedure Laterality Date  . ABDOMINAL HYSTERECTOMY     still have cervix  . ARTERY BIOPSY Right 10/06/2014   Procedure: BIOPSY TEMPORAL ARTERY;  Surgeon: Armandina Gemma, MD;  Location: [facility];  Service: General;  Laterality: Right;  . BREAST EXCISIONAL BIOPSY Right    over 20 years ago; lymph nodes removed  . CATARACT EXTRACTION  10/2011   Left  . colonscopy  2007  . INNER EAR SURGERY  1974   both stapes replaced-steal  . TUBAL LIGATION       OB History   No obstetric history on file.     Family History  Problem Relation Age of Onset  . Heart failure Mother   . Hypertension Sister   . Asthma Sister   .  Hypertension Brother     Social History   Tobacco Use  . Smoking status: Never Smoker  . Smokeless tobacco: Never Used  Substance Use Topics  . Alcohol use: Yes    Comment: rarely  . Drug use: No    Home Medications Prior to Admission medications   Medication Sig Start Date End Date Taking? Authorizing Provider  alendronate (FOSAMAX) 70 MG tablet TAKE 1 TABLET BY MOUTH  WEEKLY WITH A FULL GLASS OF WATER ON AN EMPTY STOMACH 10/28/20   Colon Branch, MD  calcium citrate-vitamin D (CITRACAL+D) 315-200 MG-UNIT tablet Take 1 tablet by mouth daily.    [provider]  Cyanocobalamin (B-12) 1000 MCG TABS Take by mouth.    [provider]  fish oil-omega-3 fatty acids 1000 MG capsule Take 2 g by mouth daily.    [provider]  fluticasone (FLONASE) 50 MCG/ACT nasal spray SHAKE WELL AND USE 2 SPRAYS IN EACH NOSTRIL DAILY 08/08/16   Marletta Lor, MD  Melatonin 3 MG TABS Take 1 tablet by mouth at bedtime.    [provider]  Multiple Vitamins-Minerals (PRESERVISION AREDS 2) CAPS Take 1 capsule by mouth 2 (two) times daily.     [provider]  zolpidem (AMBIEN) 5 MG tablet Take 1  tablet (5 mg total) by mouth at bedtime as needed. 08/01/19 01/29/24  Colon Branch, MD    Allergies    Tetanus toxoids, Penicillins, and Sulfacetamide sodium  Review of Systems   Review of Systems  All other systems reviewed and are negative.   Physical Exam Updated Vital Signs BP 137/73 (BP Location: Right Arm)   Pulse 84   Temp 98.1 F (36.7 C) (Oral)   Resp 15   Ht 1.6 m (5\' 3" )   Wt 50.8 kg   SpO2 96%   BMI 19.84 kg/m   Physical Exam Vitals and nursing note reviewed.  Constitutional:      General: She is not in acute distress.    Appearance: She is well-developed.  HENT:     Head: Normocephalic and atraumatic.     Right Ear: External ear normal.     Left Ear: External ear normal.  Eyes:     General: No scleral icterus.       Right eye: No  discharge.        Left eye: No discharge.     Conjunctiva/sclera: Conjunctivae normal.  Neck:     Trachea: No tracheal deviation.  Cardiovascular:     Rate and Rhythm: Normal rate and regular rhythm.  Pulmonary:     Effort: Pulmonary effort is normal. No respiratory distress.     Breath sounds: Normal breath sounds. No stridor. No wheezing or rales.  Abdominal:     General: Bowel sounds are normal. There is no distension.     Palpations: Abdomen is soft.     Tenderness: There is no abdominal tenderness. There is no guarding or rebound.  Musculoskeletal:        General: No tenderness.     Cervical back: Neck supple.  Skin:    General: Skin is warm and dry.     Findings: No rash.  Neurological:     Mental Status: She is alert.     Cranial Nerves: No cranial nerve deficit (no facial droop, extraocular movements intact, no slurred speech).     Sensory: No sensory deficit.     Motor: No abnormal muscle tone or seizure activity.     Coordination: Coordination normal.     ED Results / Procedures / Treatments   Labs (all labs ordered are listed, but only abnormal results are displayed) Labs Reviewed  COMPREHENSIVE METABOLIC PANEL - Abnormal; Notable for the following components:      Result Value   Glucose, Bld 107 (*)    All other components within normal limits  CBC WITH DIFFERENTIAL/PLATELET  PROTIME-INR  OCCULT BLOOD X 1 CARD TO LAB, STOOL    EKG None  Radiology No results found.  Procedures Procedures   Medications Ordered in ED Medications - No data to display  ED Course  I have reviewed the triage vital signs and the nursing notes.  Pertinent labs & imaging results that were available during my care of the patient were reviewed by me and considered in my medical decision making (see chart for details).  Clinical Course as of 11/12/20 1036  Fri Nov 12, 2020  1019 Stool negative for blood. [JK]  9379 CBC metabolic panel and coags all normal [JK]     Clinical Course User Index [JK] Dorie Rank, MD   MDM Rules/Calculators/A&P                          Patient presented to ED for  evaluation of dark-colored stool.  Patient has not noticed any bright red blood in her stool.  She has not had any abdominal pain.  Patient's physical exam is reassuring.  Was concerned about the possibility of upper GI bleed so we did perform laboratory testing as well as rectal exam.  There is no melena on rectal exam.  Her stool did appear somewhat normal color.  It was guaiac negative.  Her lab tests are also normal.  No bleeding.  Patient does not show any evidence of acute GI bleeding.  Its possible the change in stool color was related to something in her diet.  Patient does have plans to follow-up with her doctor on Monday.  This was already scheduled.  Recommended monitoring for abdominal pain fever or other concerning symptoms otherwise follow-up with her doctor on Monday as planned Final Clinical Impression(s) / ED Diagnoses Final diagnoses:  Dark stools    Rx / DC Orders ED Discharge Orders    None       Dorie Rank, MD 11/12/20 1037

## 2020-11-12 NOTE — ED Triage Notes (Signed)
Tarry loose stool x 2 days , no blood thinner intake. Hx constipation. Alert and oriented x 4, denies pain

## 2020-11-12 NOTE — Discharge Instructions (Addendum)
Your test today were reassuring.  No signs of blood in her stool and your blood count was normal.  Follow-up with your doctor next week as planned to be rechecked.  Return to the ED for bright red blood in your stool, weakness, abdominal pain or other concerning symptoms

## 2020-11-15 ENCOUNTER — Other Ambulatory Visit: Payer: Self-pay

## 2020-11-15 ENCOUNTER — Ambulatory Visit (INDEPENDENT_AMBULATORY_CARE_PROVIDER_SITE_OTHER): Payer: Medicare Other | Admitting: Internal Medicine

## 2020-11-15 VITALS — BP 124/74 | HR 92 | Temp 97.4°F | Ht 63.0 in | Wt 110.0 lb

## 2020-11-15 DIAGNOSIS — R195 Other fecal abnormalities: Secondary | ICD-10-CM

## 2020-11-15 DIAGNOSIS — R5382 Chronic fatigue, unspecified: Secondary | ICD-10-CM | POA: Diagnosis not present

## 2020-11-15 DIAGNOSIS — R634 Abnormal weight loss: Secondary | ICD-10-CM | POA: Diagnosis not present

## 2020-11-15 LAB — CBC WITH DIFFERENTIAL/PLATELET
Basophils Absolute: 0 10*3/uL (ref 0.0–0.1)
Basophils Relative: 0.6 % (ref 0.0–3.0)
Eosinophils Absolute: 0 10*3/uL (ref 0.0–0.7)
Eosinophils Relative: 0.7 % (ref 0.0–5.0)
HCT: 39.2 % (ref 36.0–46.0)
Hemoglobin: 13.2 g/dL (ref 12.0–15.0)
Lymphocytes Relative: 27.1 % (ref 12.0–46.0)
Lymphs Abs: 1.5 10*3/uL (ref 0.7–4.0)
MCHC: 33.7 g/dL (ref 30.0–36.0)
MCV: 93.2 fl (ref 78.0–100.0)
Monocytes Absolute: 0.4 10*3/uL (ref 0.1–1.0)
Monocytes Relative: 7.6 % (ref 3.0–12.0)
Neutro Abs: 3.4 10*3/uL (ref 1.4–7.7)
Neutrophils Relative %: 64 % (ref 43.0–77.0)
Platelets: 234 10*3/uL (ref 150.0–400.0)
RBC: 4.2 Mil/uL (ref 3.87–5.11)
RDW: 13.4 % (ref 11.5–15.5)
WBC: 5.4 10*3/uL (ref 4.0–10.5)

## 2020-11-15 LAB — T3, FREE: T3, Free: 3.3 pg/mL (ref 2.3–4.2)

## 2020-11-15 LAB — T4, FREE: Free T4: 0.98 ng/dL (ref 0.60–1.60)

## 2020-11-15 LAB — TSH: TSH: 2.02 u[IU]/mL (ref 0.35–4.50)

## 2020-11-15 NOTE — Progress Notes (Signed)
Subjective:    Patient ID: Savannah Jimenez, female    DOB: 11-Oct-1935, 85 y.o.   MRN: [id number]  DOS:  11/15/2020 Type of visit - description: Routine follow-up  Here for routine follow-up. About 3 weeks ago she developed diarrhea, she thinks related to something she ate. Subsequently,  went to the emergency room 11/12/2020:  She reported darker stools few hours prior to ER arrival. No bllody stools, no other symptoms. CBC, CMP, INR occult stool test: All negative or normal. No melena noted. At this point, stools are essentially back to normal.  Other than that she feels well. In the past she reports low energy, I ask if she feels fatigue: "I would like to have more energy". No DOE, occasional palpitations with exertion.  Ambulatory BPs continue to be low when she checks at home, 109/50, 96/45.  Heart rate in the 80s.  History of PMR: Denies fever chills or headaches.  History of weight loss: Has lost 2 pounds today, she thinks related to recent diarrhea which is now resolving.   Review of Systems Denies chest pain, edema. No anxiety or depression   Past Medical History:  Diagnosis Date  . Allergy   . Arthritis   . GERD (gastroesophageal reflux disease)   . Glaucoma suspect   . HOH (hard of hearing)   . Osteoporosis     Past Surgical History:  Procedure Laterality Date  . ABDOMINAL HYSTERECTOMY     still have cervix  . ARTERY BIOPSY Right 10/06/2014   Procedure: BIOPSY TEMPORAL ARTERY;  Surgeon: Armandina Gemma, MD;  Location: [facility];  Service: General;  Laterality: Right;  . BREAST EXCISIONAL BIOPSY Right    over 20 years ago; lymph nodes removed  . CATARACT EXTRACTION  10/2011   Left  . colonscopy  2007  . INNER EAR SURGERY  1974   both stapes replaced-steal  . TUBAL LIGATION      Allergies as of 11/15/2020      Reactions   Tetanus Toxoids    Arm swelling    Penicillins    Sulfacetamide Sodium       Medication List       Accurate as of  November 15, 2020 11:59 PM. If you have any questions, ask your nurse or doctor.        alendronate 70 MG tablet Commonly known as: FOSAMAX TAKE 1 TABLET BY MOUTH  WEEKLY WITH A FULL GLASS OF WATER ON AN EMPTY STOMACH   B-12 1000 MCG Tabs Take by mouth.   calcium citrate-vitamin D 315-200 MG-UNIT tablet Commonly known as: CITRACAL+D Take 1 tablet by mouth daily.   fish oil-omega-3 fatty acids 1000 MG capsule Take 2 g by mouth daily.   fluticasone 50 MCG/ACT nasal spray Commonly known as: FLONASE SHAKE WELL AND USE 2 SPRAYS IN EACH NOSTRIL DAILY   melatonin 3 MG Tabs tablet Take 1 tablet by mouth at bedtime.   PreserVision AREDS 2 Caps Take 1 capsule by mouth 2 (two) times daily.   zolpidem 5 MG tablet Commonly known as: AMBIEN Take 1 tablet (5 mg total) by mouth at bedtime as needed.          Objective:   Physical Exam BP 124/74 (BP Location: Left Arm, Patient Position: Sitting, Cuff Size: Large)   Pulse 92   Temp (!) 97.4 F (36.3 C) (Temporal)   Ht 5\' 3"  (1.6 m)   Wt 110 lb (49.9 kg)   SpO2 96%  BMI 19.49 kg/m  General:   Well developed, NAD, healthy but slightly underweight appearing HEENT:  Normocephalic . Face symmetric, atraumatic Lungs:  CTA B Normal respiratory effort, no intercostal retractions, no accessory muscle use. Heart: RRR,  no murmur.  Abdomen:  Not distended, soft, non-tender. No rebound or rigidity.  Palpable nontender aorta. Skin: Not pale. Not jaundice Lower extremities: no pretibial edema bilaterally  Neurologic:  alert & oriented X3.  Speech normal, gait appropriate for age and unassisted Psych--  Cognition and judgment appear intact.  Cooperative with normal attention span and concentration.  Behavior appropriate. No anxious or depressed appearing.     Assessment      Assessment, transferring from Dr. Lianne Cure (01/2018)  Allergies GERD Osteoporosis: Had actonel before  -Tscore  -2.2 (10-2017) Insomnia H/o polymyalgia  rheumatica; used to see Dr Charlestine Night, [facility]. Bx 2016 Palpable abdominal aorta: Abdominal ultrasound  09/2018 negative for AAA B12 deficiency   PLAN Diarrhea, dark stools: See HPI, symptoms resolving, for completeness we will recheck a CBC to be sure there has not been a drop in hemoglobin Osteoporosis: Good compliance with Fosamax and vitamin D. Fatigue: Has been ongoing problem "I like to have more energy".   Previous work-up negative, she is completely independent and seems to be doing well.  Rx observation Occasional low BP: She again showed me ambulatory readings, low BP, at the office at the recent ER visits BPs are   normal.  Recommend to check ambulatory BPs with a small cuff. Weight loss: Has dropped a couple of pounds, see previous entries.  For completeness we will check TFTs. Preventive care discussed RTC 6 to 8 months      This visit occurred during the SARS-CoV-2 public health emergency.  Safety protocols were in place, including screening questions prior to the visit, additional usage of staff PPE, and extensive cleaning of exam room while observing appropriate contact time as indicated for disinfecting solutions.

## 2020-11-15 NOTE — Patient Instructions (Addendum)
Continue checking your blood pressures, use small cuff BP GOAL is between 110/65 and  135/85. If it is consistently higher or lower, let me know  GO TO THE LAB : Get the blood work     [place], [place] back for a checkup in 6 to 8 months

## 2020-11-16 NOTE — Assessment & Plan Note (Signed)
Diarrhea, dark stools: See HPI, symptoms resolving, for completeness we will recheck a CBC to be sure there has not been a drop in hemoglobin Osteoporosis: Good compliance with Fosamax and vitamin D. Fatigue: Has been ongoing problem "I like to have more energy".   Previous work-up negative, she is completely independent and seems to be doing well.  Rx observation Occasional low BP: She again showed me ambulatory readings, low BP, at the office at the recent ER visits BPs are   normal.  Recommend to check ambulatory BPs with a small cuff. Weight loss: Has dropped a couple of pounds, see previous entries.  For completeness we will check TFTs. Preventive care discussed RTC 6 to 8 months

## 2020-11-16 NOTE — Assessment & Plan Note (Signed)
Preventive care reviewed  Td: allergic; PNM 23:  2010 and 01-2019, PNM 13:  2015.   S/p  Zostavax & shingrix. Covid vax x 3, booster  pros and cons discussed Lastd colonoscopy 2007, negative Cologuard 06/2017, after we talk about the issue she desires an additional Cologuard.  Will order. Last mammogram 6/2021per [facility]. No further Pap smears, had a partial hysterectomy, still has a cervix.

## 2020-11-22 DIAGNOSIS — Z1211 Encounter for screening for malignant neoplasm of colon: Secondary | ICD-10-CM | POA: Diagnosis not present

## 2020-11-22 LAB — COLOGUARD: Cologuard: NEGATIVE

## 2020-11-29 ENCOUNTER — Encounter: Payer: Self-pay | Admitting: Internal Medicine

## 2020-11-29 NOTE — Addendum Note (Signed)
Addended by: Wynonia Musty A on: 11/29/2020 10:09 AM   Modules accepted: Orders

## 2020-11-29 NOTE — Addendum Note (Signed)
Addended byDamita Dunnings D on: 11/29/2020 08:37 AM   Modules accepted: Orders

## 2020-12-08 ENCOUNTER — Encounter (INDEPENDENT_AMBULATORY_CARE_PROVIDER_SITE_OTHER): Payer: Medicare Other | Admitting: Ophthalmology

## 2020-12-08 ENCOUNTER — Other Ambulatory Visit: Payer: Self-pay

## 2020-12-08 DIAGNOSIS — H43813 Vitreous degeneration, bilateral: Secondary | ICD-10-CM | POA: Diagnosis not present

## 2020-12-08 DIAGNOSIS — H353121 Nonexudative age-related macular degeneration, left eye, early dry stage: Secondary | ICD-10-CM | POA: Diagnosis not present

## 2020-12-08 DIAGNOSIS — H35343 Macular cyst, hole, or pseudohole, bilateral: Secondary | ICD-10-CM | POA: Diagnosis not present

## 2020-12-27 ENCOUNTER — Other Ambulatory Visit: Payer: Self-pay

## 2020-12-27 ENCOUNTER — Ambulatory Visit (HOSPITAL_BASED_OUTPATIENT_CLINIC_OR_DEPARTMENT_OTHER)
Admission: RE | Admit: 2020-12-27 | Discharge: 2020-12-27 | Disposition: A | Discharge status: Home / Self Care | Payer: Medicare Other | Source: Ambulatory Visit | Attending: Internal Medicine | Admitting: Internal Medicine

## 2020-12-27 ENCOUNTER — Encounter (HOSPITAL_BASED_OUTPATIENT_CLINIC_OR_DEPARTMENT_OTHER): Payer: Self-pay

## 2020-12-27 DIAGNOSIS — Z1231 Encounter for screening mammogram for malignant neoplasm of breast: Secondary | ICD-10-CM | POA: Insufficient documentation

## 2021-05-04 DIAGNOSIS — L821 Other seborrheic keratosis: Secondary | ICD-10-CM | POA: Diagnosis not present

## 2021-05-04 DIAGNOSIS — Z23 Encounter for immunization: Secondary | ICD-10-CM | POA: Diagnosis not present

## 2021-05-04 DIAGNOSIS — L578 Other skin changes due to chronic exposure to nonionizing radiation: Secondary | ICD-10-CM | POA: Diagnosis not present

## 2021-05-04 DIAGNOSIS — D225 Melanocytic nevi of trunk: Secondary | ICD-10-CM | POA: Diagnosis not present

## 2021-05-04 DIAGNOSIS — L814 Other melanin hyperpigmentation: Secondary | ICD-10-CM | POA: Diagnosis not present

## 2021-05-04 DIAGNOSIS — D485 Neoplasm of uncertain behavior of skin: Secondary | ICD-10-CM | POA: Diagnosis not present

## 2021-05-04 DIAGNOSIS — C44712 Basal cell carcinoma of skin of right lower limb, including hip: Secondary | ICD-10-CM | POA: Diagnosis not present

## 2021-05-04 DIAGNOSIS — Z86018 Personal history of other benign neoplasm: Secondary | ICD-10-CM | POA: Diagnosis not present

## 2021-05-04 DIAGNOSIS — Z85828 Personal history of other malignant neoplasm of skin: Secondary | ICD-10-CM | POA: Diagnosis not present

## 2021-05-04 DIAGNOSIS — L570 Actinic keratosis: Secondary | ICD-10-CM | POA: Diagnosis not present

## 2021-05-26 ENCOUNTER — Other Ambulatory Visit (HOSPITAL_BASED_OUTPATIENT_CLINIC_OR_DEPARTMENT_OTHER): Payer: Self-pay

## 2021-05-26 DIAGNOSIS — Z23 Encounter for immunization: Secondary | ICD-10-CM | POA: Diagnosis not present

## 2021-05-26 MED ORDER — INFLUENZA VAC A&B SA ADJ QUAD 0.5 ML IM PRSY
PREFILLED_SYRINGE | INTRAMUSCULAR | 0 refills | Status: DC
  Filled 2021-05-26: qty 0.5, 1d supply, fill #0

## 2021-06-02 DIAGNOSIS — H353132 Nonexudative age-related macular degeneration, bilateral, intermediate dry stage: Secondary | ICD-10-CM | POA: Diagnosis not present

## 2021-06-02 DIAGNOSIS — H524 Presbyopia: Secondary | ICD-10-CM | POA: Diagnosis not present

## 2021-06-02 DIAGNOSIS — H40013 Open angle with borderline findings, low risk, bilateral: Secondary | ICD-10-CM | POA: Diagnosis not present

## 2021-06-02 DIAGNOSIS — H04123 Dry eye syndrome of bilateral lacrimal glands: Secondary | ICD-10-CM | POA: Diagnosis not present

## 2021-06-07 DIAGNOSIS — C44712 Basal cell carcinoma of skin of right lower limb, including hip: Secondary | ICD-10-CM | POA: Diagnosis not present

## 2021-06-07 DIAGNOSIS — L988 Other specified disorders of the skin and subcutaneous tissue: Secondary | ICD-10-CM | POA: Diagnosis not present

## 2021-06-15 ENCOUNTER — Other Ambulatory Visit: Payer: Self-pay

## 2021-06-15 ENCOUNTER — Telehealth (INDEPENDENT_AMBULATORY_CARE_PROVIDER_SITE_OTHER): Payer: Medicare Other | Admitting: Internal Medicine

## 2021-06-15 VITALS — Temp 99.6°F | Ht 63.0 in | Wt 110.0 lb

## 2021-06-15 DIAGNOSIS — J019 Acute sinusitis, unspecified: Secondary | ICD-10-CM

## 2021-06-15 DIAGNOSIS — C4491 Basal cell carcinoma of skin, unspecified: Secondary | ICD-10-CM | POA: Insufficient documentation

## 2021-06-15 MED ORDER — AZITHROMYCIN 250 MG PO TABS: ORAL_TABLET | ORAL | 0 refills | Status: DC

## 2021-06-15 MED ORDER — ZOLPIDEM TARTRATE 5 MG PO TABS: 5.0000 mg | ORAL_TABLET | Freq: Every evening | ORAL | 0 refills | Status: DC | PRN

## 2021-06-15 NOTE — Progress Notes (Signed)
Subjective:    Patient ID: Savannah Jimenez, female    DOB: 05-Oct-1935, 85 y.o.   MRN: [id number]  DOS:  06/15/2021 Type of visit - description: Virtual Visit via Telephone    I connected with above mentioned patient  by telephone and verified that I am speaking with the correct person using two identifiers.  THIS ENCOUNTER IS A VIRTUAL VISIT DUE TO COVID-19 - PATIENT WAS NOT SEEN IN THE OFFICE. PATIENT HAS CONSENTED TO VIRTUAL VISIT / TELEMEDICINE VISIT   Location of patient: home  Location of provider: office  Persons participating in the virtual visit: patient, provider   I discussed the limitations, risks, security and privacy concerns of performing an evaluation and management service by telephone and the availability of in person appointments. I also discussed with the patient that there may be a patient responsible charge related to this service. The patient expressed understanding and agreed to proceed.  Acute Symptoms a started 6 days ago: Cough, sinus pain and congestion, blowing green and at times bloody discharge from the nose. She has a lot of mucus pooling on her throat. Is using Mucinex and Flonase. COVID-negative test yesterday.  Denies chest pain or difficulty breathing No nausea or vomiting No chest pain or difficulty breathing.  Has not hear any chest rattling. No myalgias.   Review of Systems See above   Past Medical History:  Diagnosis Date   Allergy    Arthritis    BCC (basal cell carcinoma of skin)    GERD (gastroesophageal reflux disease)    Glaucoma suspect    HOH (hard of hearing)    Osteoporosis     Past Surgical History:  Procedure Laterality Date   ABDOMINAL HYSTERECTOMY     still have cervix   ARTERY BIOPSY Right 10/06/2014   Procedure: BIOPSY TEMPORAL ARTERY;  Surgeon: Armandina Gemma, MD;  Location: [place] [facility];  Service: General;  Laterality: Right;   BREAST EXCISIONAL BIOPSY Right    over 20 years ago; lymph nodes removed    CATARACT EXTRACTION  10/2011   Left   colonscopy  2007   INNER EAR SURGERY  1974   both stapes replaced-steal   TUBAL LIGATION      Allergies as of 06/15/2021       Reactions   Tetanus Toxoids    Arm swelling    Penicillins    Sulfacetamide Sodium         Medication List        Accurate as of June 15, 2021  9:02 AM. If you have any questions, ask your nurse or doctor.          STOP taking these medications    Fluad Quadrivalent 0.5 ML injection Generic drug: influenza vaccine adjuvanted Stopped by: Kathlene November, MD       TAKE these medications    alendronate 70 MG tablet Commonly known as: FOSAMAX TAKE 1 TABLET BY MOUTH  WEEKLY WITH A FULL GLASS OF WATER ON AN EMPTY STOMACH   B-12 1000 MCG Tabs Take by mouth.   calcium citrate-vitamin D 315-200 MG-UNIT tablet Commonly known as: CITRACAL+D Take 1 tablet by mouth daily.   fish oil-omega-3 fatty acids 1000 MG capsule Take 2 g by mouth daily.   fluticasone 50 MCG/ACT nasal spray Commonly known as: FLONASE SHAKE WELL AND USE 2 SPRAYS IN EACH NOSTRIL DAILY   melatonin 3 MG Tabs tablet Take 1 tablet by mouth at bedtime.   PreserVision AREDS 2 Caps  Take 1 capsule by mouth 2 (two) times daily.   zolpidem 5 MG tablet Commonly known as: AMBIEN Take 1 tablet (5 mg total) by mouth at bedtime as needed.           Objective:   Physical Exam Temp 99.6 F (37.6 C)   Ht 5\' 3"  (1.6 m)   Wt 110 lb (49.9 kg)   BMI 19.49 kg/m  We were unable to communicate via the video, on the phone she sounded well, alert oriented x3, speaking in complete sentences, she did cough a couple of times, no chest congestion that I can tell. No O2 sats or BPs from today available but in the last few days her BP been okay at around 111/57    Assessment    Assessment, transferring from Dr. Lianne Cure (01/2018)  Allergies GERD Osteoporosis: Had actonel before  -Tscore  -2.2 (10-2017) Insomnia H/o polymyalgia rheumatica;  used to see Dr Charlestine Night, [facility]. Bx 2016 Palpable abdominal aorta: Abdominal ultrasound  09/2018 negative for AAA B12 deficiency    PLAN Sinusitis: The patient knows the limitations of a phone assessment, based on her symptoms she probably has sinusitis. Plan: Mucinex, Floranex as she is doing.  Add Zithromax.  She is allergic to penicillin. Call if not gradually better in the next few days Insomnia: Takes Ambien sporadically, request RF, PDMP okay, RF sen t  to [facility] at patient's request.   I discussed the assessment and treatment plan with the patient. The patient was provided an opportunity to ask questions and all were answered. The patient agreed with the plan and demonstrated an understanding of the instructions.   The patient was advised to call back or seek an in-person evaluation if the symptoms worsen or if the condition fails to improve as anticipated.  I provided 14 minutes of non-face-to-face time during this encounter.  Kathlene November, MD

## 2021-06-17 NOTE — Assessment & Plan Note (Signed)
Assessment, transferring from Dr. Lianne Cure (01/2018)  Allergies GERD Osteoporosis: Had actonel before  -Tscore  -2.2 (10-2017) Insomnia H/o polymyalgia rheumatica; used to see Dr Charlestine Night, [facility]. Bx 2016 Palpable abdominal aorta: Abdominal ultrasound  09/2018 negative for AAA B12 deficiency    PLAN Sinusitis: The patient knows the limitations of a phone assessment, based on her symptoms she probably has sinusitis. Plan: Mucinex, Floranex as she is doing.  Add Zithromax.  She is allergic to penicillin. Call if not gradually better in the next few days Insomnia: Takes Ambien sporadically, request RF, PDMP okay, RF sen t  to [facility] at patient's request.

## 2021-06-23 ENCOUNTER — Other Ambulatory Visit: Payer: Self-pay

## 2021-06-23 ENCOUNTER — Ambulatory Visit (INDEPENDENT_AMBULATORY_CARE_PROVIDER_SITE_OTHER): Payer: Medicare Other | Admitting: Internal Medicine

## 2021-06-23 ENCOUNTER — Encounter: Payer: Self-pay | Admitting: Internal Medicine

## 2021-06-23 ENCOUNTER — Ambulatory Visit (HOSPITAL_BASED_OUTPATIENT_CLINIC_OR_DEPARTMENT_OTHER)
Admission: RE | Admit: 2021-06-23 | Discharge: 2021-06-23 | Disposition: A | Discharge status: Home / Self Care | Payer: Medicare Other | Source: Ambulatory Visit | Attending: Internal Medicine | Admitting: Internal Medicine

## 2021-06-23 VITALS — BP 122/82 | HR 94 | Temp 97.8°F | Resp 16 | Ht 63.0 in | Wt 106.1 lb

## 2021-06-23 DIAGNOSIS — R0609 Other forms of dyspnea: Secondary | ICD-10-CM

## 2021-06-23 DIAGNOSIS — R5383 Other fatigue: Secondary | ICD-10-CM | POA: Diagnosis not present

## 2021-06-23 DIAGNOSIS — R0600 Dyspnea, unspecified: Secondary | ICD-10-CM | POA: Diagnosis not present

## 2021-06-23 DIAGNOSIS — I517 Cardiomegaly: Secondary | ICD-10-CM | POA: Diagnosis not present

## 2021-06-23 DIAGNOSIS — F419 Anxiety disorder, unspecified: Secondary | ICD-10-CM

## 2021-06-23 DIAGNOSIS — R634 Abnormal weight loss: Secondary | ICD-10-CM

## 2021-06-23 LAB — CBC WITH DIFFERENTIAL/PLATELET
Basophils Absolute: 0 10*3/uL (ref 0.0–0.1)
Basophils Relative: 0.6 % (ref 0.0–3.0)
Eosinophils Absolute: 0 10*3/uL (ref 0.0–0.7)
Eosinophils Relative: 0.4 % (ref 0.0–5.0)
HCT: 36.9 % (ref 36.0–46.0)
Hemoglobin: 12.3 g/dL (ref 12.0–15.0)
Lymphocytes Relative: 23.3 % (ref 12.0–46.0)
Lymphs Abs: 1.3 10*3/uL (ref 0.7–4.0)
MCHC: 33.4 g/dL (ref 30.0–36.0)
MCV: 93.9 fl (ref 78.0–100.0)
Monocytes Absolute: 0.3 10*3/uL (ref 0.1–1.0)
Monocytes Relative: 5.8 % (ref 3.0–12.0)
Neutro Abs: 4 10*3/uL (ref 1.4–7.7)
Neutrophils Relative %: 69.9 % (ref 43.0–77.0)
Platelets: 385 10*3/uL (ref 150.0–400.0)
RBC: 3.94 Mil/uL (ref 3.87–5.11)
RDW: 13.2 % (ref 11.5–15.5)
WBC: 5.7 10*3/uL (ref 4.0–10.5)

## 2021-06-23 LAB — BASIC METABOLIC PANEL
BUN: 22 mg/dL (ref 6–23)
CO2: 30 mEq/L (ref 19–32)
Calcium: 9.8 mg/dL (ref 8.4–10.5)
Chloride: 99 mEq/L (ref 96–112)
Creatinine, Ser: 0.72 mg/dL (ref 0.40–1.20)
GFR: 77.56 mL/min (ref >60.00)
Glucose, Bld: 90 mg/dL (ref 70–99)
Potassium: 4.6 mEq/L (ref 3.5–5.1)
Sodium: 137 mEq/L (ref 135–145)

## 2021-06-23 LAB — TSH: TSH: 1.85 u[IU]/mL (ref 0.35–5.50)

## 2021-06-23 MED ORDER — PAROXETINE HCL 10 MG PO TABS: ORAL_TABLET | ORAL | 2 refills | Status: DC

## 2021-06-23 NOTE — Assessment & Plan Note (Signed)
Anxiety: New issue, recently lost a sister, she is becoming the caregiver for her husband, she is overwhelmed. Request help. Recommend to consider psychotherapy, start a SSRI, I am electing Paxil due to history of weight loss.  See instructions. Fatigue: The patient's daughter liked to discuss this issue.  The patient likes to have more energy as she had before.  Today she admits to some DOE.  Denies chest pain or lower extremity edema. EKG today: NSR, no acute changes. I believe symptoms are multifactorial, for completeness we will get a chest x-ray, labs:  BMP, CBC, TSH.  Offered cardiology referral. Osteoporosis: Self stopped Fosamax due to constipation and jaw pain.  Reassess in the future Weight loss: Lost 4 pounds.  Labs Mild lightheadedness: Denies diplopia, slurred speech or motor deficits.  Mostly when she stands up.  Rec observation. RTC 2 months

## 2021-06-23 NOTE — Progress Notes (Signed)
Subjective:    Patient ID: Savannah Jimenez, female    DOB: 11/18/1935, 85 y.o.   MRN: [id number]  DOS:  06/23/2021 Type of visit - description: Follow-up, here with her daughter.  Patient is here with the daughter, they like to address multiple issues. Main concern is anxiety, this is going on for a while. She recently lost a sister, her husband health is deteriorating to some degree, she is becoming a caregiver.  At times she feels overwhelmed. No depression per se Sleep is not perfect, Ambien helps. Symptoms of anxiety also include shakiness, palpitation tearing.  Also continue with fatigue, denies fever chills.  No chest pain no edema.  Occasional DOE. No unusual aches and pains No cough. No rash.  Also some dizziness, since she has sinusitis, described as mild lightheadedness when she stands up. No stroke symptoms  Wt Readings from Last 3 Encounters:  06/23/21 106 lb 2 oz (48.1 kg)  06/15/21 110 lb (49.9 kg)  11/15/20 110 lb (49.9 kg)     Review of Systems See above   Past Medical History:  Diagnosis Date   Allergy    Arthritis    BCC (basal cell carcinoma of skin)    GERD (gastroesophageal reflux disease)    Glaucoma suspect    HOH (hard of hearing)    Osteoporosis     Past Surgical History:  Procedure Laterality Date   ABDOMINAL HYSTERECTOMY     still have cervix   ARTERY BIOPSY Right 10/06/2014   Procedure: BIOPSY TEMPORAL ARTERY;  Surgeon: Armandina Gemma, MD;  Location: [place];  Service: General;  Laterality: Right;   BREAST EXCISIONAL BIOPSY Right    over 20 years ago; lymph nodes removed   CATARACT EXTRACTION  10/2011   Left   colonscopy  2007   INNER EAR SURGERY  1974   both stapes replaced-steal   TUBAL LIGATION      Allergies as of 06/23/2021       Reactions   Tetanus Toxoids    Arm swelling    Penicillins    Sulfacetamide Sodium         Medication List        Accurate as of June 23, 2021 10:47 AM. If you have any  questions, ask your nurse or doctor.          alendronate 70 MG tablet Commonly known as: FOSAMAX TAKE 1 TABLET BY MOUTH  WEEKLY WITH A FULL GLASS OF WATER ON AN EMPTY STOMACH   azithromycin 250 MG tablet Commonly known as: Zithromax Z-Pak 2 tabs a day the first day, then 1 tab a day x 4 days   B-12 1000 MCG Tabs Take by mouth.   calcium citrate-vitamin D 315-200 MG-UNIT tablet Commonly known as: CITRACAL+D Take 1 tablet by mouth daily.   fish oil-omega-3 fatty acids 1000 MG capsule Take 2 g by mouth daily.   fluticasone 50 MCG/ACT nasal spray Commonly known as: FLONASE SHAKE WELL AND USE 2 SPRAYS IN EACH NOSTRIL DAILY   melatonin 3 MG Tabs tablet Take 1 tablet by mouth at bedtime.   PreserVision AREDS 2 Caps Take 1 capsule by mouth 2 (two) times daily.   zolpidem 5 MG tablet Commonly known as: AMBIEN Take 1 tablet (5 mg total) by mouth at bedtime as needed.           Objective:   Physical Exam BP 122/82 (BP Location: Left Arm, Patient Position: Sitting, Cuff Size: Small)  Pulse 94   Temp 97.8 F (36.6 C) (Oral)   Resp 16   Ht 5\' 3"  (1.6 m)   Wt 106 lb 2 oz (48.1 kg)   SpO2 96%   BMI 18.80 kg/m  General:   Well developed, NAD, BMI noted.  HEENT:  Normocephalic . Face symmetric, atraumatic.  Lungs:  CTA B Normal respiratory effort, no intercostal retractions, no accessory muscle use. Heart: RRR,  no murmur.  Abdomen:  Not distended, soft, non-tender. No rebound or rigidity.   Skin: Not pale. Not jaundice Lower extremities: no pretibial edema bilaterally  Neurologic:  alert & oriented X3.  Speech normal, gait appropriate for age and unassisted Psych--  Cognition and judgment appear intact.  Cooperative with normal attention span and concentration.  Behavior appropriate. Tearful during the visit..     Assessment      Assessment, transferring from Dr. Lianne Cure (01/2018)  Allergies GERD Osteoporosis: Had actonel before  -Tscore  -2.2  (10-2017) Insomnia H/o polymyalgia rheumatica; used to see Dr Charlestine Night, [facility] Rota. Bx 2016 Palpable abdominal aorta: Abdominal ultrasound  09/2018 negative for AAA B12 deficiency    PLAN Anxiety: New issue, recently lost a sister, she is becoming the caregiver for her husband, she is overwhelmed. Request help. Recommend to consider psychotherapy, start a SSRI, I am electing Paxil due to history of weight loss.  See instructions. Fatigue: The patient's daughter liked to discuss this issue.  The patient likes to have more energy as she had before.  Today she admits to some DOE.  Denies chest pain or lower extremity edema. EKG today: NSR, no acute changes. I believe symptoms are multifactorial, for completeness we will get a chest x-ray, labs:  BMP, CBC, TSH.  Offered cardiology referral. Osteoporosis: Self stopped Fosamax due to constipation and jaw pain.  Reassess in the future Weight loss: Lost 4 pounds.  Labs Mild lightheadedness: Denies diplopia, slurred speech or motor deficits.  Mostly when she stands up.  Rec observation. RTC 2 months   This visit occurred during the SARS-CoV-2 public health emergency.  Safety protocols were in place, including screening questions prior to the visit, additional usage of staff PPE, and extensive cleaning of exam room while observing appropriate contact time as indicated for disinfecting solutions.

## 2021-06-23 NOTE — Patient Instructions (Addendum)
Stop paroxetine 10 mg tablets: 1 tablet daily for 2 weeks, then 2 tablets daily Consider seeing a counselor  GO TO THE LAB : Get the blood work     [place], [place] back for a checkup in 2 months  STOP BY THE FIRST FLOOR:  get the XR

## 2021-06-24 ENCOUNTER — Telehealth: Payer: Self-pay | Admitting: Internal Medicine

## 2021-06-24 ENCOUNTER — Other Ambulatory Visit: Payer: Self-pay

## 2021-06-24 DIAGNOSIS — R911 Solitary pulmonary nodule: Secondary | ICD-10-CM

## 2021-06-24 DIAGNOSIS — R918 Other nonspecific abnormal finding of lung field: Secondary | ICD-10-CM

## 2021-06-24 NOTE — Telephone Encounter (Signed)
Labs okay. Chest x-ray: R nodule, CT needed to rule out malignancy. (Patient has no symptoms, no FH of malignancy, never smoker). Plan:  CT chest with contrast (discussed with radiology) DX pulmonary nodule. Patient aware.

## 2021-06-24 NOTE — Telephone Encounter (Signed)
Order placed

## 2021-06-24 NOTE — Telephone Encounter (Signed)
Error

## 2021-06-27 ENCOUNTER — Other Ambulatory Visit: Payer: Self-pay

## 2021-06-27 ENCOUNTER — Emergency Department (HOSPITAL_BASED_OUTPATIENT_CLINIC_OR_DEPARTMENT_OTHER): Payer: Medicare Other

## 2021-06-27 ENCOUNTER — Emergency Department (HOSPITAL_BASED_OUTPATIENT_CLINIC_OR_DEPARTMENT_OTHER)
Admission: EM | Admit: 2021-06-27 | Discharge: 2021-06-27 | Disposition: A | Discharge status: Home / Self Care | Payer: Medicare Other | Attending: Emergency Medicine | Admitting: Emergency Medicine

## 2021-06-27 ENCOUNTER — Encounter (HOSPITAL_BASED_OUTPATIENT_CLINIC_OR_DEPARTMENT_OTHER): Payer: Self-pay | Admitting: *Deleted

## 2021-06-27 ENCOUNTER — Telehealth: Payer: Self-pay | Admitting: Internal Medicine

## 2021-06-27 DIAGNOSIS — R030 Elevated blood-pressure reading, without diagnosis of hypertension: Secondary | ICD-10-CM

## 2021-06-27 DIAGNOSIS — H538 Other visual disturbances: Secondary | ICD-10-CM | POA: Diagnosis not present

## 2021-06-27 DIAGNOSIS — R918 Other nonspecific abnormal finding of lung field: Secondary | ICD-10-CM | POA: Diagnosis not present

## 2021-06-27 DIAGNOSIS — H539 Unspecified visual disturbance: Secondary | ICD-10-CM | POA: Diagnosis not present

## 2021-06-27 DIAGNOSIS — R911 Solitary pulmonary nodule: Secondary | ICD-10-CM | POA: Diagnosis not present

## 2021-06-27 DIAGNOSIS — I10 Essential (primary) hypertension: Secondary | ICD-10-CM | POA: Diagnosis not present

## 2021-06-27 DIAGNOSIS — I672 Cerebral atherosclerosis: Secondary | ICD-10-CM | POA: Diagnosis not present

## 2021-06-27 DIAGNOSIS — Z85828 Personal history of other malignant neoplasm of skin: Secondary | ICD-10-CM | POA: Diagnosis not present

## 2021-06-27 DIAGNOSIS — H579 Unspecified disorder of eye and adnexa: Secondary | ICD-10-CM

## 2021-06-27 DIAGNOSIS — I6523 Occlusion and stenosis of bilateral carotid arteries: Secondary | ICD-10-CM | POA: Diagnosis not present

## 2021-06-27 MED ORDER — IOHEXOL 300 MG/ML  SOLN
80.0000 mL | Freq: Once | INTRAMUSCULAR | Status: AC | PRN
  Administered 2021-06-27: 80 mL via INTRAVENOUS

## 2021-06-27 NOTE — Telephone Encounter (Signed)
Technically can not speak with her if she is not on DPR to discuss. Will await triage note.

## 2021-06-27 NOTE — Telephone Encounter (Signed)
Pt's daughter called, and stated pt may be having side effects due to new medication. She stated since she started paxil she has been having vision problems. Symptoms include double vision and or glowed/ haloed vision at times. Vision is also blurred on left eye and blood pressure is much higher than usual, usually 111/57 current bp is 141/79. She also does see a retinal specialist and has a grid for macular degeneration and does not seem to be any different than usual. Pt was triaged to go over symptoms. Fyi  daughter is not listed on dpr but does take her parents to all their appts and pt was in the room. Daughter informed she will fill out forms.

## 2021-06-27 NOTE — ED Triage Notes (Signed)
C/o increased BP and stress. Seen by PMD last Thursday , started on Paxil. CT scan  of chest sched here tomorrow DX noddle on right lung , weight loss.

## 2021-06-27 NOTE — ED Provider Notes (Signed)
[facility] EMERGENCY DEPARTMENT Provider Note   CSN: [id number] Arrival date & time: 06/27/21  1705     History Chief Complaint  Patient presents with   Hypertension    Savannah Jimenez is a 85 y.o. female.  Patient c/o visual symptoms in past day. States recent concern about her bp being higher than normal, and increased stress - recently started on paxil for same. States also was concerned as told possible lung nodule and need for ct tomorrow for same. Non smoker, no chest pain or sob. States in past day, has noted some visual symptoms. States at times when watching tv or looking at things they will seem to have a pronounced glow. Also notes earlier was seen double images, vertical, but was only when she looked at certain objects, and has not been persistent. No amaurosis or visual field cut. No black spots or floaters. No eye pain. No headache. States sees a retinal specialist, but unsure of prior diagnosis. Currently no double visions or abnormal glow to objects. No associated change in speech. No change in balance, coordination, or strength. No numbness.   The history is provided by the patient, a relative and medical records.  Hypertension Pertinent negatives include no chest pain, no abdominal pain, no headaches and no shortness of breath.      Past Medical History:  Diagnosis Date   Allergy    Arthritis    BCC (basal cell carcinoma of skin)    GERD (gastroesophageal reflux disease)    Glaucoma suspect    HOH (hard of hearing)    Osteoporosis     Patient Active Problem List   Diagnosis Date Noted   BCC (basal cell carcinoma of skin) 06/15/2021   Insomnia 06/19/2020   Glaucoma suspect 06/10/2020   Annual physical exam 06/05/2018   Vitamin B 12 deficiency 06/05/2018   PCP NOTES >>>>>>>>>>>>>>> 01/29/2018   Right temporal headache 10/05/2014   Polymyalgia rheumatica ([place]) 05/18/2011   Allergic rhinitis 03/22/2007   GERD 03/22/2007   Osteoporosis 03/22/2007     Past Surgical History:  Procedure Laterality Date   ABDOMINAL HYSTERECTOMY     still have cervix   ARTERY BIOPSY Right 10/06/2014   Procedure: BIOPSY TEMPORAL ARTERY;  Surgeon: Armandina Gemma, MD;  Location: [place];  Service: General;  Laterality: Right;   BREAST EXCISIONAL BIOPSY Right    over 20 years ago; lymph nodes removed   CATARACT EXTRACTION  10/2011   Left   colonscopy  2007   INNER EAR SURGERY  1974   both stapes replaced-steal   TUBAL LIGATION       OB History   No obstetric history on file.     Family History  Problem Relation Age of Onset   Heart failure Mother    Hypertension Sister    Asthma Sister    Hypertension Brother     Social History   Tobacco Use   Smoking status: Never   Smokeless tobacco: Never  Substance Use Topics   Alcohol use: Yes    Comment: rarely   Drug use: No    Home Medications Prior to Admission medications   Medication Sig Start Date End Date Taking? Authorizing Provider  alendronate (FOSAMAX) 70 MG tablet TAKE 1 TABLET BY MOUTH  WEEKLY WITH A FULL GLASS OF WATER ON AN EMPTY STOMACH Patient not taking: Reported on 06/15/2021 10/28/20   Colon Branch, MD  calcium citrate-vitamin D (CITRACAL+D) 315-200 MG-UNIT tablet Take 1 tablet by  mouth daily.    [provider]  Cyanocobalamin (B-12) 1000 MCG TABS Take by mouth.    [provider]  fish oil-omega-3 fatty acids 1000 MG capsule Take 2 g by mouth daily.    [provider]  fluticasone (FLONASE) 50 MCG/ACT nasal spray SHAKE WELL AND USE 2 SPRAYS IN EACH NOSTRIL DAILY 08/08/16   Marletta Lor, MD  Melatonin 3 MG TABS Take 1 tablet by mouth at bedtime.    [provider]  Multiple Vitamins-Minerals (PRESERVISION AREDS 2) CAPS Take 1 capsule by mouth 2 (two) times daily.     [provider]  PARoxetine (PAXIL) 10 MG tablet 1 tablet daily for 2 weeks, then 2 tablets daily 06/23/21   Colon Branch, MD  zolpidem (AMBIEN) 5  MG tablet Take 1 tablet (5 mg total) by mouth at bedtime as needed. 06/15/21 12/13/25  Colon Branch, MD    Allergies    Tetanus toxoids, Penicillins, and Sulfacetamide sodium  Review of Systems   Review of Systems  Constitutional:  Negative for chills and fever.  HENT:  Negative for sore throat and trouble swallowing.   Eyes:  Positive for visual disturbance. Negative for pain and redness.  Respiratory:  Negative for shortness of breath.   Cardiovascular:  Negative for chest pain.  Gastrointestinal:  Negative for abdominal pain, nausea and vomiting.  Genitourinary:  Negative for flank pain.  Musculoskeletal:  Negative for back pain and neck pain.  Skin:  Negative for rash.  Neurological:  Negative for speech difficulty, weakness, numbness and headaches.  Hematological:  Does not bruise/bleed easily.  Psychiatric/Behavioral:  Negative for confusion.    Physical Exam Updated Vital Signs BP (!) 180/83   Pulse 92   Temp 98.3 F (36.8 C) (Oral)   Resp 18   Ht 1.6 m (5\' 3" )   Wt 49 kg   SpO2 98%   BMI 19.14 kg/m   Physical Exam Vitals and nursing note reviewed.  Constitutional:      Appearance: Normal appearance. She is well-developed.  HENT:     Head: Atraumatic.     Right Ear: Tympanic membrane normal.     Left Ear: Tympanic membrane normal.     Nose: Nose normal.     Mouth/Throat:     Mouth: Mucous membranes are moist.     Comments: Fundus, sharp disc, no papilledema, no hem.  Eyes:     General: No scleral icterus.    Extraocular Movements: Extraocular movements intact.     Conjunctiva/sclera: Conjunctivae normal.     Pupils: Pupils are equal, round, and reactive to light.  Neck:     Vascular: No carotid bruit.     Trachea: No tracheal deviation.  Cardiovascular:     Rate and Rhythm: Normal rate and regular rhythm.     Pulses: Normal pulses.     Heart sounds: Normal heart sounds. No murmur heard.   No friction rub. No gallop.  Pulmonary:     Effort: Pulmonary  effort is normal. No respiratory distress.     Breath sounds: Normal breath sounds.  Abdominal:     General: Bowel sounds are normal. There is no distension.     Palpations: Abdomen is soft.     Tenderness: There is no abdominal tenderness.  Genitourinary:    Comments: No cva tenderness.  Musculoskeletal:        General: No swelling.     Cervical back: Normal range of motion and neck supple. No  rigidity. No muscular tenderness.     Right lower leg: No edema.     Left lower leg: No edema.  Skin:    General: Skin is warm and dry.     Findings: No rash.  Neurological:     Mental Status: She is alert and oriented to person, place, and time.     Cranial Nerves: No cranial nerve deficit.     Comments: Alert, speech normal. No dysarthria or aphasia. Motor intact bil. Stre 5/5. Sens grossly intact. Steady gait.   Psychiatric:        Mood and Affect: Mood normal.    ED Results / Procedures / Treatments   Labs (all labs ordered are listed, but only abnormal results are displayed) Labs Reviewed - No data to display  EKG None  Radiology CT HEAD WO CONTRAST (5MM)  Result Date: 06/27/2021 CLINICAL DATA:  Vision changes and elevated blood pressure. EXAM: CT HEAD WITHOUT CONTRAST TECHNIQUE: Contiguous axial images were obtained from the base of the skull through the vertex without intravenous contrast. COMPARISON:  None. FINDINGS: Brain: No evidence of acute large vascular territory infarction, hemorrhage, hydrocephalus, extra-axial collection or mass lesion/mass effect. Mild age-related global parenchymal volume loss and ex vacuo dilatation of ventricular system. Mild burden of subcortical and periventricular white matter hypodensities likely reflecting chronic ischemic small vessel white matter disease. Vascular: No hyperdense vessel. Atherosclerotic calcifications of the internal carotid arteries at the skull base. Skull: Normal. Negative for fracture or focal lesion. Sinuses/Orbits: Visualized  portions of the paranasal sinuses and mastoid air cells are predominantly clear. Orbits are unremarkable. Other: None. IMPRESSION: 1. No acute intracranial findings. 2. Mild age-related global parenchymal volume loss and chronic ischemic small vessel white matter disease. Electronically Signed   By: Dahlia Bailiff M.D.   On: 06/27/2021 19:11   CT Chest W Contrast  Result Date: 06/27/2021 CLINICAL DATA:  Question lung nodule. EXAM: CT CHEST WITH CONTRAST TECHNIQUE: Multidetector CT imaging of the chest was performed during intravenous contrast administration. CONTRAST:  43mL OMNIPAQUE IOHEXOL 300 MG/ML  SOLN COMPARISON:  Chest x-ray 06/23/2021. FINDINGS: Cardiovascular: Heart is mildly enlarged. There is no pericardial effusion. Aorta is normal in size. There are atherosclerotic calcifications of the aorta. There is no large central pulmonary embolism. Mediastinum/Nodes: No enlarged mediastinal, hilar, or axillary lymph nodes. Thyroid gland, trachea, and esophagus demonstrate no significant findings. Lungs/Pleura: There is focal airspace disease which is slightly nodular measuring 11 x 10 x 22 mm in the right middle lobe. Borders are slightly spiculated. There is also small amount of atelectasis or scarring in the lingula and anterior right middle lobe. There is no pleural effusion or pneumothorax. Trachea and central airways are patent. Upper Abdomen: No acute abnormality. Musculoskeletal: There is pectus deformity of the chest compressing the right ventricle. No acute fractures. IMPRESSION: 1. Nodular airspace opacity in the right middle lobe measuring 22 mm, indeterminate. Findings may be related to infection or neoplasm. Consider one of the following in 3 months for both low-risk and high-risk individuals: (a) repeat chest CT, (b) follow-up PET-CT, or (c) tissue sampling. This recommendation follows the consensus statement: Guidelines for Management of Incidental Pulmonary Nodules Detected on CT Images: From  the Fleischner Society 2017; Radiology 2017; 284:228-243. 2. Mild cardiomegaly. Pectus deformity of the chest compresses the right atrium. Electronically Signed   By: Ronney Asters M.D.   On: 06/27/2021 19:23    Procedures Procedures   Medications Ordered in ED Medications - No data to display  ED Course  I have reviewed the triage vital signs and the nursing notes.  Pertinent labs & imaging results that were available during my care of the patient were reviewed by me and considered in my medical decision making (see chart for details).    MDM Rules/Calculators/A&P                          Recent labs reviewed - chem normal. Hgb normal.   Reviewed nursing notes and prior charts for additional history.   Pt/fam request tomorrows ct be done here. Will order.   CT reviewed/interpreted  by me - no hem, neg acute. Right middle lobe opacity, no definitive dx by ct. Pt denies cough, fever or sob. Will have f/u pcp.   Recheck, no current visual symptoms.   Pt appears stable for d/c.   Rec pcp f/u and ophthy f/u.  Return precautions provided.      Final Clinical Impression(s) / ED Diagnoses Final diagnoses:  None    Rx / DC Orders ED Discharge Orders     None        Lajean Saver, MD 06/27/21 [phone]

## 2021-06-27 NOTE — Discharge Instructions (Addendum)
It was our pleasure to provide your ER care today - we hope that you feel better.  Your blood pressure is mildly high today - follow up with your primary care doctor.   Take a baby aspirin a day.   Your ct scan was read as showing:1. Nodular airspace opacity in the right middle lobe measuring 22 mm, indeterminate. Findings may be related to infection or neoplasm. Consider one of the following in 3 months for both low-risk and high-risk individuals: (a) repeat chest CT, (b) follow-up PET-CT, or (c) tissue sampling.   Follow up with primary care doctor in the coming week for recent symptoms, as well as for CT reading above - discuss with them arranging follow up with lung specialist and the follow up testing that is recommended.   For recent on and off visual symptoms, also follow up closely with your eye specialist this coming week - call office tomorrow to arrange appointment.   Return to ER if worse, new symptoms, change in speech or vision, one-sided numbness/weakness, fevers, increased cough or shortness of breath, or other concern.

## 2021-06-28 ENCOUNTER — Telehealth: Payer: Self-pay | Admitting: Internal Medicine

## 2021-06-28 ENCOUNTER — Ambulatory Visit (HOSPITAL_BASED_OUTPATIENT_CLINIC_OR_DEPARTMENT_OTHER): Payer: Medicare Other

## 2021-06-28 ENCOUNTER — Encounter (HOSPITAL_BASED_OUTPATIENT_CLINIC_OR_DEPARTMENT_OTHER): Payer: Self-pay

## 2021-06-28 NOTE — Telephone Encounter (Signed)
Schedule a virtual visit to include Ms. Blakleigh and her daughter, we can discuss all her concerns then. Continue Paxil for now.

## 2021-06-28 NOTE — Telephone Encounter (Signed)
Pt called and stated daughter had call to talk with Dr.Paz/Kaylyn regarding her mom. She stated her daughter Kendra Opitz) has spoken with Dr.Paz on her behalf and she was calling to discuss the new medication her mom is on. Due to no DPR we were not able to discuss anything with daughter when she called earlier. Pt is aware of DPR policy and son will be picking up DPR for pt to sign and submit. Pt did state it was okay to talk to daughter [name]) regarding medication issue.  [phone] Opitz

## 2021-06-28 NOTE — Telephone Encounter (Signed)
Pt evaluated/treated in ER.   [facility] Day - ClientTELEPHONE ADVICE RECORDAccessNurse PatientName:Savannah Jimenez Initial Comment Caller states her mother is on Paxil and now she is having double vision. Translation No Nurse Assessment Nurse: Tawni Pummel, RN, [place] Gave Date/Time Eilene Ghazi Time): 06/27/2021 4:30:26 PM Confirm and document reason for call. If symptomatic, describe symptoms. ---Caller states recent paxil med started; states double vision; states 'things just don't look right' Does the patient have any new or worsening symptoms? ---Yes Will a triage be completed? ---Yes Related visit to physician within the last 2 weeks? ---No Does the PT have any chronic conditions? (i.e. diabetes, asthma, this includes High risk factors for pregnancy, etc.) ---No Is this a behavioral health or substance abuse call? ---No Guidelines Guideline Title Affirmed Question Affirmed Notes Nurse Date/Time Eilene Ghazi Time) Vision Loss or Change Double vision Tawni Pummel, RNHarrell Gave 06/27/2021 4:32:39 PM Disp. Time Eilene Ghazi Time) Disposition Final User 06/27/2021 4:37:48 PM Go to ED Now (or PCP triage) Yes Tawni Pummel, RN, Eusebio Friendly Disagree/Comply Comply Caller Understands Yes PLEASE NOTE: All timestamps contained within this report are represented as [place] Standard Time. CONFIDENTIALTY NOTICE: This fax transmission is intended only for the addressee. It contains information that is legally privileged, confidential or otherwise protected from use or disclosure. If you are not the intended recipient, you are strictly prohibited from reviewing, disclosing, copying using or disseminating any of this information or taking any action in reliance on or regarding this information. If you have received this fax in error, please notify us immediately by telephone so that we can arrange for its return to [place]. Phone: [phone], Toll-Free: [phone], Fax: [phone] Page: 2 of  2 Call Id: [id number] PreDisposition Did not know what to do Care Advice Given Per Guideline * IF NO PCP (PRIMARY CARE PROVIDER) SECOND-LEVEL TRIAGE: You need to be seen within the next hour. Go to the [facility] at _____________ [place] as soon as you can. GO TO ED NOW (OR PCP TRIAGE): * IF PCP SECONDLEVEL TRIAGE REQUIRED: You may need to be seen. Your doctor (or NP/PA) will want to talk with you to decide what's best. I'll page the provider on-call now. If you haven't heard from the provider (or me) within 30 minutes, go directly to the [facility] at _____________ [place] given per Vision Loss or Change (Adult) guideline. * It is better and safer if another adult drives instead of you. ANOTHER ADULT SHOULD DRIVE: Referrals [facility] - ED

## 2021-06-28 NOTE — Telephone Encounter (Signed)
Will forward to PCP and Maudie Mercury, RN- there is no DPR on file to discuss with daughter.

## 2021-06-28 NOTE — Telephone Encounter (Signed)
Pt's daughter called back to go over some questions she had regarding the er visit. She was advised for her mother to make an appt to follow up but she stated her mom is not in the best head space and may have to run around to other dr offices and it would be too much. She was also offered vv and she said she would let dr. Larose Kells decide if they need to do ov or vv. Also double checked and could not find her on any documents releasing mother's information and she was informed it may be hard for pcp or nurse to call her regarding this.  She stated Dr. Larose Kells had said he would start calling daughter at her mom's last appt. She left her name Savannah Jimenez and phone number [phone]. She wanted to go over questions/comments regarding her mother:  1.Her mother had a brain scan for stroke, it came back negative and was advised to take baby asprin and wants to know if they continue that.  2. Bp was elevated and ranged from 148/78-189/90  3. Paxil was started Thursday and blurred/ doubled vision started Saturday and is unsure if they should continue with medication.  4. Does she need to make appt with her eye dr?  5. Ct scan was inconclusive on scar tissue or mass  Pt's daughter would like an update on what she needs to do next for mom, please advise.

## 2021-06-28 NOTE — Telephone Encounter (Signed)
DPR obtained. Pt scheduled for VV with PCP on 06/29/2021.  Advised to continue Paxil and follow discharge instructions from ER until visit with PCP.  Daughter verbalized understanding.

## 2021-06-29 ENCOUNTER — Encounter: Payer: Self-pay | Admitting: Internal Medicine

## 2021-06-29 ENCOUNTER — Telehealth (INDEPENDENT_AMBULATORY_CARE_PROVIDER_SITE_OTHER): Payer: Medicare Other | Admitting: Internal Medicine

## 2021-06-29 VITALS — BP 113/51 | HR 90 | Ht 63.0 in | Wt 106.0 lb

## 2021-06-29 DIAGNOSIS — H532 Diplopia: Secondary | ICD-10-CM | POA: Diagnosis not present

## 2021-06-29 DIAGNOSIS — G459 Transient cerebral ischemic attack, unspecified: Secondary | ICD-10-CM | POA: Diagnosis not present

## 2021-06-29 DIAGNOSIS — H43822 Vitreomacular adhesion, left eye: Secondary | ICD-10-CM | POA: Diagnosis not present

## 2021-06-29 DIAGNOSIS — R911 Solitary pulmonary nodule: Secondary | ICD-10-CM

## 2021-06-29 DIAGNOSIS — R918 Other nonspecific abnormal finding of lung field: Secondary | ICD-10-CM | POA: Diagnosis not present

## 2021-06-29 DIAGNOSIS — H353132 Nonexudative age-related macular degeneration, bilateral, intermediate dry stage: Secondary | ICD-10-CM | POA: Diagnosis not present

## 2021-06-29 DIAGNOSIS — H04123 Dry eye syndrome of bilateral lacrimal glands: Secondary | ICD-10-CM | POA: Diagnosis not present

## 2021-06-29 NOTE — Progress Notes (Signed)
Subjective:    Patient ID: Savannah Jimenez, female    DOB: 06-Feb-1936, 85 y.o.   MRN: [id number]  DOS:  06/29/2021 Type of visit - description: Virtual Visit via Telephone    I connected with above mentioned patient  by telephone and verified that I am speaking with the correct person using two identifiers.  THIS ENCOUNTER IS A VIRTUAL VISIT DUE TO COVID-19 - PATIENT WAS NOT SEEN IN THE OFFICE. PATIENT HAS CONSENTED TO VIRTUAL VISIT / TELEMEDICINE VISIT   Location of patient: home  Location of provider: office  Persons participating in the virtual visit: patient, provider   I discussed the limitations, risks, security and privacy concerns of performing an evaluation and management service by telephone and the availability of in person appointments. I also discussed with the patient that there may be a patient responsible charge related to this service. The patient expressed understanding and agreed to proceed.    Acute Went to the ER 06/27/2021.  This visit was set up to discuss the results from the ER visit that they have multiple questions  Main concern that drove the patient to the ER was diplopia, elevated BP, increased stress.   At the ER, her BP was 180/83, funduscopy negative.  Work-up included:   CT chest:  1. Nodular airspace opacity in the right middle lobe measuring 22  mm, indeterminate. Findings may be related to infection or neoplasm.  Consider one of the following in 3 months for both low-risk and  high-risk individuals: (a) repeat chest CT, (b) follow-up PET-CT, or  (c) tissue sampling. This recommendation follows the consensus  statement: Guidelines for Management of Incidental Pulmonary Nodules  Detected on CT Images: From the Fleischner Society 2017; Radiology  2017; 284:228-243.  2. Mild cardiomegaly. Pectus deformity of the chest compresses the  right atrium.   CT head  - No acute intracranial findings.  - Mild age-related global parenchymal volume loss  and chronic  ischemic small vessel white matter disease.    The patient again reports diplopia, no recent nausea, vomiting, headache, speech impairment, facial numbness or weakness. She is somewhat lightheaded. Today she went to her ophthalmologist, her clinical diagnosis was TIA which I agree with.  Today, she also continues denying fever, chest pain, cough  Review of Systems See above   Past Medical History:  Diagnosis Date   Allergy    Arthritis    BCC (basal cell carcinoma of skin)    GERD (gastroesophageal reflux disease)    Glaucoma suspect    HOH (hard of hearing)    Osteoporosis     Past Surgical History:  Procedure Laterality Date   ABDOMINAL HYSTERECTOMY     still have cervix   ARTERY BIOPSY Right 10/06/2014   Procedure: BIOPSY TEMPORAL ARTERY;  Surgeon: Armandina Gemma, MD;  Location: [facility];  Service: General;  Laterality: Right;   BREAST EXCISIONAL BIOPSY Right    over 20 years ago; lymph nodes removed   CATARACT EXTRACTION  10/2011   Left   colonscopy  2007   INNER EAR SURGERY  1974   both stapes replaced-steal   TUBAL LIGATION      Allergies as of 06/29/2021       Reactions   Tetanus Toxoids    Arm swelling    Penicillins    Sulfacetamide Sodium         Medication List        Accurate as of June 29, 2021 11:59 PM.  If you have any questions, ask your nurse or doctor.          alendronate 70 MG tablet Commonly known as: FOSAMAX TAKE 1 TABLET BY MOUTH  WEEKLY WITH A FULL GLASS OF WATER ON AN EMPTY STOMACH   aspirin EC 81 MG tablet Take 81 mg by mouth daily. Swallow whole.   atorvastatin 20 MG tablet Commonly known as: LIPITOR Take 1 tablet (20 mg total) by mouth at bedtime. Started by: Kathlene November, MD   B-12 1000 MCG Tabs Take by mouth.   calcium citrate-vitamin D 315-200 MG-UNIT tablet Commonly known as: CITRACAL+D Take 1 tablet by mouth daily.   fish oil-omega-3 fatty acids 1000 MG capsule Take 2 g by mouth  daily.   fluticasone 50 MCG/ACT nasal spray Commonly known as: FLONASE SHAKE WELL AND USE 2 SPRAYS IN EACH NOSTRIL DAILY   melatonin 3 MG Tabs tablet Take 1 tablet by mouth at bedtime.   PARoxetine 10 MG tablet Commonly known as: Paxil 1 tablet daily for 2 weeks, then 2 tablets daily   PreserVision AREDS 2 Caps Take 1 capsule by mouth 2 (two) times daily.   zolpidem 5 MG tablet Commonly known as: AMBIEN Take 1 tablet (5 mg total) by mouth at bedtime as needed.           Objective:   Physical Exam BP (!) 113/51   Pulse 90   Ht 5\' 3"  (1.6 m)   Wt 106 lb (48.1 kg)   BMI 18.78 kg/m  This is a telephone visit, I spoke with the patient her daughter.  The patient sounded well, speaking in complete sentences without any impairment.  She seems alert and oriented x3    Assessment      Assessment, transferring from Dr. Lianne Cure (01/2018)  Allergies GERD Osteoporosis: Had actonel before  -Tscore  -2.2 (10-2017) Insomnia H/o polymyalgia rheumatica; used to see Dr Charlestine Night, [facility]olee Rota. Bx 2016 Palpable abdominal aorta: Abdominal ultrasound  09/2018 negative for AAA B12 deficiency    PLAN TIA: The patient recently went to the ER complaining of diplopia and elevated BP, CT head was nonacute, subsequently saw her ophthalmologist, clinical Dx was TIA.  I agree that is in the DDx. Plan: Continue with aspirin started at the emergency room Start atorvastatin 20 mg Brain MRI, echo, carotid ultrasound, neurology referral. Monitor BPs, monitor symptoms, ER if strokelike symptoms. Lung opacity: At the last visit I ordered a chest x-ray and it was abnormal subsequently had a CT of the chest 2 days ago showing a nodular opacity at the right middle lobe, suspicious for infection or malignancy.  No fever or chills currently.  Plan: Refer to pulmonary  Anxiety: Continue with anxiety, just started Paxil 10 mg a week ago, not feeling better, she wonders about more rapid acting medication, we talk  about benzodiazepine but the patient declines. Plan: Including Paxil to 20 mg a day today. Reassess in 2 months as scheduled     I discussed the assessment and treatment plan with the patient. The patient was provided an opportunity to ask questions and all were answered. The patient agreed with the plan and demonstrated an understanding of the instructions.   The patient was advised to call back or seek an in-person evaluation if the symptoms worsen or if the condition fails to improve as anticipated.  I provided 30 minutes of non-face-to-face time during this encounter.  Kathlene November, MD

## 2021-06-30 ENCOUNTER — Telehealth: Payer: Self-pay | Admitting: Internal Medicine

## 2021-06-30 MED ORDER — ATORVASTATIN CALCIUM 20 MG PO TABS: 20.0000 mg | ORAL_TABLET | Freq: Every day | ORAL | 0 refills | Status: DC

## 2021-06-30 NOTE — Assessment & Plan Note (Signed)
TIA: The patient recently went to the ER complaining of diplopia and elevated BP, CT head was nonacute, subsequently saw her ophthalmologist, clinical Dx was TIA.  I agree that is in the DDx. Plan: Continue with aspirin started at the emergency room Start atorvastatin 20 mg Brain MRI, echo, carotid ultrasound, neurology referral. Monitor BPs, monitor symptoms, ER if strokelike symptoms. Lung opacity: At the last visit I ordered a chest x-ray and it was abnormal subsequently had a CT of the chest 2 days ago showing a nodular opacity at the right middle lobe, suspicious for infection or malignancy.  No fever or chills currently.  Plan: Refer to pulmonary  Anxiety: Continue with anxiety, just started Paxil 10 mg a week ago, not feeling better, she wonders about more rapid acting medication, we talk about benzodiazepine but the patient declines. Plan: Including Paxil to 20 mg a day today. Reassess in 2 months as scheduled

## 2021-06-30 NOTE — Telephone Encounter (Signed)
LMOM informing Pt's daughter [name] of below. Rx sent. Instructed to call if questions/concerns.

## 2021-06-30 NOTE — Telephone Encounter (Signed)
Please apologize to her, I forgot.  Send Lipitor 20 mg 1 p.o. nightly, 39-month supply

## 2021-06-30 NOTE — Telephone Encounter (Signed)
Please advise 

## 2021-06-30 NOTE — Telephone Encounter (Signed)
Madisun Woon's daughter is calling because Dr. Larose Kells was supposed to send new cholesterol medication to their pharmacy yesterday but when she went to the pharmacy today, they didn't have it. Please advice.    [id number] [place], [address] AT [facility] OF [address]  [address] [place] [address]  Phone:  [phone]  Fax:  [phone]

## 2021-07-05 ENCOUNTER — Ambulatory Visit (HOSPITAL_COMMUNITY)
Admission: RE | Admit: 2021-07-05 | Discharge: 2021-07-05 | Disposition: A | Discharge status: Home / Self Care | Payer: Medicare Other | Source: Ambulatory Visit | Attending: Cardiology | Admitting: Cardiology

## 2021-07-05 ENCOUNTER — Other Ambulatory Visit: Payer: Self-pay

## 2021-07-05 DIAGNOSIS — G459 Transient cerebral ischemic attack, unspecified: Secondary | ICD-10-CM | POA: Diagnosis not present

## 2021-07-06 ENCOUNTER — Emergency Department (HOSPITAL_BASED_OUTPATIENT_CLINIC_OR_DEPARTMENT_OTHER): Payer: Medicare Other

## 2021-07-06 ENCOUNTER — Emergency Department (HOSPITAL_BASED_OUTPATIENT_CLINIC_OR_DEPARTMENT_OTHER)
Admission: EM | Admit: 2021-07-06 | Discharge: 2021-07-06 | Disposition: A | Discharge status: Home / Self Care | Payer: Medicare Other | Attending: Emergency Medicine | Admitting: Emergency Medicine

## 2021-07-06 ENCOUNTER — Telehealth: Payer: Self-pay | Admitting: Internal Medicine

## 2021-07-06 ENCOUNTER — Encounter (HOSPITAL_BASED_OUTPATIENT_CLINIC_OR_DEPARTMENT_OTHER): Payer: Self-pay

## 2021-07-06 ENCOUNTER — Other Ambulatory Visit: Payer: Self-pay

## 2021-07-06 DIAGNOSIS — R55 Syncope and collapse: Secondary | ICD-10-CM | POA: Insufficient documentation

## 2021-07-06 DIAGNOSIS — W1839XA Other fall on same level, initial encounter: Secondary | ICD-10-CM | POA: Insufficient documentation

## 2021-07-06 DIAGNOSIS — Z79899 Other long term (current) drug therapy: Secondary | ICD-10-CM | POA: Insufficient documentation

## 2021-07-06 DIAGNOSIS — Z7982 Long term (current) use of aspirin: Secondary | ICD-10-CM | POA: Diagnosis not present

## 2021-07-06 DIAGNOSIS — S0990XA Unspecified injury of head, initial encounter: Secondary | ICD-10-CM | POA: Diagnosis present

## 2021-07-06 DIAGNOSIS — S0003XA Contusion of scalp, initial encounter: Secondary | ICD-10-CM | POA: Insufficient documentation

## 2021-07-06 LAB — BASIC METABOLIC PANEL
Anion gap: 8 (ref 5–15)
BUN: 25 mg/dL — ABNORMAL HIGH (ref 8–23)
CO2: 27 mmol/L (ref 22–32)
Calcium: 8.6 mg/dL — ABNORMAL LOW (ref 8.9–10.3)
Chloride: 101 mmol/L (ref 98–111)
Creatinine, Ser: 0.73 mg/dL (ref 0.44–1.00)
GFR, Estimated: >60 mL/min (ref >60)
Glucose, Bld: 97 mg/dL (ref 70–99)
Potassium: 4 mmol/L (ref 3.5–5.1)
Sodium: 136 mmol/L (ref 135–145)

## 2021-07-06 LAB — CBC WITH DIFFERENTIAL/PLATELET
Abs Immature Granulocytes: 0.02 10*3/uL (ref 0.00–0.07)
Basophils Absolute: 0 10*3/uL (ref 0.0–0.1)
Basophils Relative: 0 %
Eosinophils Absolute: 0 10*3/uL (ref 0.0–0.5)
Eosinophils Relative: 0 %
HCT: 37.1 % (ref 36.0–46.0)
Hemoglobin: 12.2 g/dL (ref 12.0–15.0)
Immature Granulocytes: 0 %
Lymphocytes Relative: 13 %
Lymphs Abs: 0.8 10*3/uL (ref 0.7–4.0)
MCH: 31.1 pg (ref 26.0–34.0)
MCHC: 32.9 g/dL (ref 30.0–36.0)
MCV: 94.6 fL (ref 80.0–100.0)
Monocytes Absolute: 0.4 10*3/uL (ref 0.1–1.0)
Monocytes Relative: 6 %
Neutro Abs: 5 10*3/uL (ref 1.7–7.7)
Neutrophils Relative %: 81 %
Platelets: 205 10*3/uL (ref 150–400)
RBC: 3.92 MIL/uL (ref 3.87–5.11)
RDW: 13.2 % (ref 11.5–15.5)
WBC: 6.2 10*3/uL (ref 4.0–10.5)
nRBC: 0 % (ref 0.0–0.2)

## 2021-07-06 MED ORDER — ACETAMINOPHEN 325 MG PO TABS
650.0000 mg | ORAL_TABLET | Freq: Once | ORAL | Status: AC
  Administered 2021-07-06: 13:00:00 650 mg via ORAL
  Filled 2021-07-06: qty 2

## 2021-07-06 NOTE — ED Triage Notes (Addendum)
Pt arrives from home with reports of possible LOC today. Pt states that she was sitting at home speaking with family on the phone, got up after started to walk down the hall reports she felt dizzy and woke up on the hardwood floors with a bump on her head. When patient got up she called her daughter who brought her here. Daughter was called around 11.  Ice applied at home.   Daughter reports recently being put on Paxil for anxiety, reports patient has some lightheadedness normally. States that 3-4 days after Paxil she had some double vision was seen here and had a head CT which was normal. Had carotid doppler yesterday and has upcoming MRI scheduled. Reports her eye doctor thinks she had a small TIA. When seen at the eye doctor she was found to have poorer vision in her RUQ of eye, patient reports it comes and goes.

## 2021-07-06 NOTE — Telephone Encounter (Signed)
Noted  

## 2021-07-06 NOTE — Telephone Encounter (Signed)
Pt daughter called in and stated that pt called her this am and stated she was in the middle of her morning routine and fainted. She now has a big knot on the back of her head. Daughter will be taking pt to ER.

## 2021-07-06 NOTE — ED Provider Notes (Signed)
[facility] DEPARTMENT Provider Note   CSN: [id number] Arrival date & time: 07/06/21  1041     History Chief Complaint  Patient presents with   Loss of Consciousness    Savannah Jimenez is a 85 y.o. female.  Presents to ER after LOC.  Patient reports that she frequently gets lightheaded if she stands up too quickly but has not passed out previously.  Today she stood up quickly and started walking down the hallway and felt lightheaded and then passed out.  Believes she hit her head on the floor.  Had some mild pain to her bottom from the fall but has been able to walk without difficulty.  Currently she denies any ongoing lightheadedness.  Did not have any associated chest pain or difficulty breathing.  Daughter reports patient has been evaluated by her primary doctor for TIA, double vision.  Patient currently does not have any double vision.  Has also seen her ophthalmologist.  Recently obtained vascular studies of carotids.  Has scheduled echo, neuro follow-up.  HPI     Past Medical History:  Diagnosis Date   Allergy    Arthritis    BCC (basal cell carcinoma of skin)    GERD (gastroesophageal reflux disease)    Glaucoma suspect    HOH (hard of hearing)    Osteoporosis     Patient Active Problem List   Diagnosis Date Noted   BCC (basal cell carcinoma of skin) 06/15/2021   Insomnia 06/19/2020   Glaucoma suspect 06/10/2020   Annual physical exam 06/05/2018   Vitamin B 12 deficiency 06/05/2018   PCP NOTES >>>>>>>>>>>>>>> 01/29/2018   Right temporal headache 10/05/2014   Polymyalgia rheumatica ([place]) 05/18/2011   Allergic rhinitis 03/22/2007   GERD 03/22/2007   Osteoporosis 03/22/2007    Past Surgical History:  Procedure Laterality Date   ABDOMINAL HYSTERECTOMY     still have cervix   ARTERY BIOPSY Right 10/06/2014   Procedure: BIOPSY TEMPORAL ARTERY;  Surgeon: Armandina Gemma, MD;  Location: [facility];  Service: General;  Laterality: Right;    BREAST EXCISIONAL BIOPSY Right    over 20 years ago; lymph nodes removed   CATARACT EXTRACTION  10/2011   Left   colonscopy  2007   INNER EAR SURGERY  1974   both stapes replaced-steal   TUBAL LIGATION       OB History   No obstetric history on file.     Family History  Problem Relation Age of Onset   Heart failure Mother    Hypertension Sister    Asthma Sister    Hypertension Brother     Social History   Tobacco Use   Smoking status: Never   Smokeless tobacco: Never  Substance Use Topics   Alcohol use: Yes    Comment: rarely   Drug use: No    Home Medications Prior to Admission medications   Medication Sig Start Date End Date Taking? Authorizing Provider  alendronate (FOSAMAX) 70 MG tablet TAKE 1 TABLET BY MOUTH  WEEKLY WITH A FULL GLASS OF WATER ON AN EMPTY STOMACH Patient not taking: Reported on 06/15/2021 10/28/20   Colon Branch, MD  aspirin EC 81 MG tablet Take 81 mg by mouth daily. Swallow whole.    [provider]  atorvastatin (LIPITOR) 20 MG tablet Take 1 tablet (20 mg total) by mouth at bedtime. 06/30/21   Colon Branch, MD  calcium citrate-vitamin D (CITRACAL+D) 315-200 MG-UNIT tablet Take 1 tablet by mouth  daily.    [provider]  Cyanocobalamin (B-12) 1000 MCG TABS Take by mouth.    [provider]  fish oil-omega-3 fatty acids 1000 MG capsule Take 2 g by mouth daily.    [provider]  fluticasone (FLONASE) 50 MCG/ACT nasal spray SHAKE WELL AND USE 2 SPRAYS IN EACH NOSTRIL DAILY 08/08/16   Marletta Lor, MD  Melatonin 3 MG TABS Take 1 tablet by mouth at bedtime.    [provider]  Multiple Vitamins-Minerals (PRESERVISION AREDS 2) CAPS Take 1 capsule by mouth 2 (two) times daily.     [provider]  PARoxetine (PAXIL) 10 MG tablet 1 tablet daily for 2 weeks, then 2 tablets daily 06/23/21   Colon Branch, MD  zolpidem (AMBIEN) 5 MG tablet Take 1 tablet (5 mg total) by mouth at bedtime as needed.  06/15/21 12/13/25  Colon Branch, MD    Allergies    Tetanus toxoids, Penicillins, and Sulfacetamide sodium  Review of Systems   Review of Systems  Constitutional:  Positive for fever. Negative for chills.  HENT:  Negative for ear pain and sore throat.   Eyes:  Negative for pain and visual disturbance.  Respiratory:  Negative for cough and shortness of breath.   Cardiovascular:  Negative for chest pain and palpitations.  Gastrointestinal:  Negative for abdominal pain and vomiting.  Genitourinary:  Negative for dysuria and hematuria.  Musculoskeletal:  Negative for arthralgias, back pain and neck pain.  Skin:  Negative for color change and rash.  Neurological:  Negative for seizures and syncope.  All other systems reviewed and are negative.  Physical Exam Updated Vital Signs BP 131/69    Pulse 70    Temp 98.1 F (36.7 C) (Oral)    Resp (!) 23    Ht 5\' 3"  (1.6 m)    Wt 48.1 kg    SpO2 99%    BMI 18.78 kg/m   Physical Exam Vitals and nursing note reviewed.  Constitutional:      General: She is not in acute distress.    Appearance: She is well-developed.  HENT:     Head: Normocephalic.     Comments: Left occipital hematoma, no laceration Eyes:     Conjunctiva/sclera: Conjunctivae normal.  Cardiovascular:     Rate and Rhythm: Normal rate and regular rhythm.     Heart sounds: No murmur heard. Pulmonary:     Effort: Pulmonary effort is normal. No respiratory distress.     Breath sounds: Normal breath sounds.  Abdominal:     Palpations: Abdomen is soft.     Tenderness: There is no abdominal tenderness.  Musculoskeletal:        General: No swelling.     Cervical back: Neck supple.     Comments: Back: no C, T, L spine TTP, no step off or deformity RUE: no TTP throughout, no deformity, normal joint ROM, radial pulse intact, distal sensation and motor intact LUE: no TTP throughout, no deformity, normal joint ROM, radial pulse intact, distal sensation and motor intact RLE:  no TTP  throughout, no deformity, normal joint ROM, distal pulse, sensation and motor intact LLE: no TTP throughout, no deformity, normal joint ROM, distal pulse, sensation and motor intact  Skin:    General: Skin is warm and dry.     Capillary Refill: Capillary refill takes less than 2 seconds.  Neurological:     Mental Status: She is alert.     Comments: AAOx3 CN 2-12  intact, speech clear visual fields intact 5/5 strength in b/l UE and LE Sensation to light touch intact in b/l UE and LE Normal FNF Normal gait  Psychiatric:        Mood and Affect: Mood normal.    ED Results / Procedures / Treatments   Labs (all labs ordered are listed, but only abnormal results are displayed) Labs Reviewed  BASIC METABOLIC PANEL - Abnormal; Notable for the following components:      Result Value   BUN 25 (*)    Calcium 8.6 (*)    All other components within normal limits  CBC WITH DIFFERENTIAL/PLATELET  URINALYSIS, ROUTINE W REFLEX MICROSCOPIC    EKG EKG Interpretation  Date/Time:  Wednesday July 06 2021 10:56:46 EST Ventricular Rate:  79 PR Interval:  232 QRS Duration: 82 QT Interval:  367 QTC Calculation: 421 R Axis:   115 Text Interpretation: Sinus rhythm Prolonged PR interval LAE, consider biatrial enlargement Right axis deviation Anteroseptal infarct, age indeterminate Confirmed by Madalyn Rob ([id number]) on 07/06/2021 11:53:24 AM  Radiology DG Chest 2 View  Result Date: 07/06/2021 CLINICAL DATA:  Syncope, fall. EXAM: CHEST - 2 VIEW COMPARISON:  Chest radiographs 06/23/2021.  CT 06/27/2021. FINDINGS: The heart size and mediastinal contours are stable. Heart size is accentuated by a pectus deformity of the sternum. Right middle lobe nodular density appears slightly smaller on the frontal examination, although this difference may be related to projectional differences. The lungs are otherwise clear. There is no pleural effusion or pneumothorax. No acute osseous findings are seen.  Telemetry leads overlie the chest. IMPRESSION: 1. No evidence of acute cardiopulmonary process. 2. Persistent indeterminate right middle lobe nodular density as seen on previous radiographs and CT. Please refer to CT follow-up recommendations of 06/27/2021. Electronically Signed   By: Richardean Sale M.D.   On: 07/06/2021 12:27   DG Pelvis 1-2 Views  Result Date: 07/06/2021 CLINICAL DATA:  Syncope.  Fall. EXAM: PELVIS - 1-2 VIEW COMPARISON:  None. FINDINGS: The bones appear mildly demineralized. No evidence of acute fracture or dislocation. The sacroiliac joints and symphysis pubis appear intact. The hip joint spaces are preserved. No focal soft tissue abnormalities are identified. IMPRESSION: No evidence of acute pelvic fracture or dislocation. Electronically Signed   By: Richardean Sale M.D.   On: 07/06/2021 12:24   CT Head Wo Contrast  Result Date: 07/06/2021 CLINICAL DATA:  Head trauma, fall EXAM: CT HEAD WITHOUT CONTRAST TECHNIQUE: Contiguous axial images were obtained from the base of the skull through the vertex without intravenous contrast. COMPARISON:  CT examination dated June 27, 2021 FINDINGS: Brain: No evidence of acute infarction, hemorrhage, hydrocephalus, extra-axial collection or mass lesion/mass effect. Cerebral atrophy and chronic microvascular ischemic changes of the white matter, unchanged. Vascular: No hyperdense vessel or unexpected calcification. Skull: Left occipital scalp hematoma without evidence of calvarial fracture Sinuses/Orbits: No acute finding. Other: None. IMPRESSION: 1.  No acute intracranial abnormality. 2. Left occipital scalp hematoma without evidence of calvarial fracture. Electronically Signed   By: Keane Police D.O.   On: 07/06/2021 12:17   VAS US CAROTID  Result Date: 07/05/2021 Carotid Arterial Duplex Study Patient Name:  Savannah Jimenez  Date of Exam:   07/05/2021 Medical Rec #: [id number]      Accession #:    [id number] Date of Birth: 04-17-1936       Patient Gender: F Patient Age:   24 years Exam Location:  [facility] Procedure:      VAS US CAROTID Referring  Phys: JOSE PAZ --------------------------------------------------------------------------------  Indications:  TIA.               Patient reports blurred vision in the distance. She also reports               some shaking but is unsure if this is related to a new               mediciation. Denies any other cerebrovascular symptoms. Risk Factors: No history of smoking. Performing Technologist: Leavy Cella RDCS Supporting Technologist: Mariane Masters RVT  Examination Guidelines: A complete evaluation includes B-mode imaging, spectral Doppler, color Doppler, and power Doppler as needed of all accessible portions of each vessel. Bilateral testing is considered an integral part of a complete examination. Limited examinations for reoccurring indications may be performed as noted.  Right Carotid Findings: +----------+--------+--------+--------+------------------+--------+             PSV cm/s EDV cm/s Stenosis Plaque Description Comments  +----------+--------+--------+--------+------------------+--------+  CCA Prox   75       17                                             +----------+--------+--------+--------+------------------+--------+  CCA Mid    73       15                                             +----------+--------+--------+--------+------------------+--------+  CCA Distal 75       19                                             +----------+--------+--------+--------+------------------+--------+  ICA Prox   53       15                heterogenous                 +----------+--------+--------+--------+------------------+--------+  ICA Mid    83       25       1-39%                                 +----------+--------+--------+--------+------------------+--------+  ICA Distal 82       27                                   tortuous  +----------+--------+--------+--------+------------------+--------+  ECA         52       9                                              +----------+--------+--------+--------+------------------+--------+ +----------+--------+-------+----------------+-------------------+             PSV cm/s EDV cms Describe         Arm Pressure (mmHG)  +----------+--------+-------+----------------+-------------------+  Subclavian 119              Multiphasic,  WNL 140                  +----------+--------+-------+----------------+-------------------+ +---------+--------+--+--------+--+---------+  Vertebral PSV cm/s 72 EDV cm/s 26 Antegrade  +---------+--------+--+--------+--+---------+  Left Carotid Findings: +----------+--------+--------+--------+------------------+--------+             PSV cm/s EDV cm/s Stenosis Plaque Description Comments  +----------+--------+--------+--------+------------------+--------+  CCA Prox   86       20                                             +----------+--------+--------+--------+------------------+--------+  CCA Mid    74       21                                             +----------+--------+--------+--------+------------------+--------+  CCA Distal 65       18                                             +----------+--------+--------+--------+------------------+--------+  ICA Prox   87       33       1-39%    heterogenous                 +----------+--------+--------+--------+------------------+--------+  ICA Mid    61       21                                             +----------+--------+--------+--------+------------------+--------+  ICA Distal 115      39                                             +----------+--------+--------+--------+------------------+--------+  ECA        59       9                                              +----------+--------+--------+--------+------------------+--------+ +----------+--------+--------+----------------+-------------------+             PSV cm/s EDV cm/s Describe         Arm Pressure (mmHG)   +----------+--------+--------+----------------+-------------------+  Subclavian 112               Multiphasic, WNL 135                  +----------+--------+--------+----------------+-------------------+ +---------+--------+--+--------+--+---------+  Vertebral PSV cm/s 75 EDV cm/s 15 Antegrade  +---------+--------+--+--------+--+---------+   Summary: Right Carotid: Velocities in the right ICA are consistent with a 1-39% stenosis. Left Carotid: Velocities in the left ICA are consistent with a 1-39% stenosis. Vertebrals:  Bilateral vertebral arteries demonstrate antegrade flow. Subclavians: Normal flow hemodynamics were seen in bilateral subclavian              arteries. *See table(s) above for measurements and observations.  Electronically signed by Quay Burow MD on 07/05/2021 at 6:15:01 PM.    Final     Procedures Procedures   Medications Ordered in ED Medications  acetaminophen (TYLENOL) tablet 650 mg (650 mg Oral Given 07/06/21 1256)    ED Course  I have reviewed the triage vital signs and the nursing notes.  Pertinent labs & imaging results that were available during my care of the patient were reviewed by me and considered in my medical decision making (see chart for details).    MDM Rules/Calculators/A&P                           85 year old lady with episode of syncope, head trauma.  On exam patient well-appearing in no distress with normal vital signs.  Noted small hematoma to occiput.  No other traumatic findings on exam.  No midline neck or back pain.  CT head negative for process intracranially.  Basic labs are all stable.  EKG without significant change, telemetry monitoring.  Notably patient currently being worked up in the outpatient setting for possible TIA.  Patient denies any new neurologic complaints today and has a normal neurologic exam including normal visual field.  Given her current appearance and work-up today believe she is appropriate for outpatient management.   Recommend close follow-up with primary.  After the discussed management above, the patient was determined to be safe for discharge.  The patient was in agreement with this plan and all questions regarding their care were answered.  ED return precautions were discussed and the patient will return to the ED with any significant worsening of condition.    Final Clinical Impression(s) / ED Diagnoses Final diagnoses:  Syncope and collapse  Hematoma of scalp, initial encounter    Rx / DC Orders ED Discharge Orders     None        Lucrezia Starch, MD 07/06/21 1538

## 2021-07-06 NOTE — Telephone Encounter (Signed)
Just an FYI

## 2021-07-06 NOTE — Discharge Instructions (Addendum)
Please follow-up with Dr. Larose Kells regarding this episode of passing out.  Recommend getting the additional testing that has been previously ordered.  I would also discuss being considered for a cardiac monitor or Holter monitor with Dr. Larose Kells.   If you have any additional episodes of passing out, any vision change, speech change, numbness, weakness, difficulty breathing, chest pain or other new concerning symptom, come up to ER for reassessment.

## 2021-07-08 ENCOUNTER — Ambulatory Visit (INDEPENDENT_AMBULATORY_CARE_PROVIDER_SITE_OTHER): Payer: Medicare Other | Admitting: Pulmonary Disease

## 2021-07-08 ENCOUNTER — Ambulatory Visit (INDEPENDENT_AMBULATORY_CARE_PROVIDER_SITE_OTHER): Payer: Medicare Other | Admitting: Internal Medicine

## 2021-07-08 ENCOUNTER — Encounter: Payer: Self-pay | Admitting: Internal Medicine

## 2021-07-08 ENCOUNTER — Other Ambulatory Visit: Payer: Self-pay

## 2021-07-08 ENCOUNTER — Telehealth: Payer: Self-pay | Admitting: Internal Medicine

## 2021-07-08 ENCOUNTER — Encounter: Payer: Self-pay | Admitting: Pulmonary Disease

## 2021-07-08 VITALS — BP 106/64 | HR 95 | Temp 97.9°F | Ht 63.0 in | Wt 107.4 lb

## 2021-07-08 VITALS — BP 126/72 | HR 81 | Temp 98.1°F | Resp 12 | Ht 63.0 in | Wt 108.8 lb

## 2021-07-08 DIAGNOSIS — Q676 Pectus excavatum: Secondary | ICD-10-CM

## 2021-07-08 DIAGNOSIS — R911 Solitary pulmonary nodule: Secondary | ICD-10-CM

## 2021-07-08 DIAGNOSIS — R55 Syncope and collapse: Secondary | ICD-10-CM

## 2021-07-08 DIAGNOSIS — G459 Transient cerebral ischemic attack, unspecified: Secondary | ICD-10-CM

## 2021-07-08 DIAGNOSIS — F419 Anxiety disorder, unspecified: Secondary | ICD-10-CM | POA: Diagnosis not present

## 2021-07-08 DIAGNOSIS — J984 Other disorders of lung: Secondary | ICD-10-CM | POA: Diagnosis not present

## 2021-07-08 DIAGNOSIS — R0609 Other forms of dyspnea: Secondary | ICD-10-CM | POA: Diagnosis not present

## 2021-07-08 MED ORDER — BUSPIRONE HCL 7.5 MG PO TABS: 7.5000 mg | ORAL_TABLET | Freq: Two times a day (BID) | ORAL | 1 refills | Status: DC

## 2021-07-08 NOTE — Patient Instructions (Signed)
Thank you for visiting Dr. Valeta Harms at [facility]. Today we recommend the following:  Orders Placed This Encounter  Procedures   NM PET Image Initial (PI) Skull Base To Thigh (F-18 FDG)   Return in about 4 weeks (around 08/05/2021) for with Eric Form, NP, or Dr. Valeta Harms. After PET is completed.     Please do your part to reduce the spread of COVID-19.

## 2021-07-08 NOTE — Telephone Encounter (Signed)
error 

## 2021-07-08 NOTE — Progress Notes (Signed)
Subjective:    Patient ID: Savannah Jimenez, female    DOB: 06/09/1936, 85 y.o.   MRN: [id number]  DOS:  07/08/2021 Type of visit - description: Acute, here with her daughter [name]  Since the last visit went to the ER 07/06/2021 d/t syncope , she stood up quickly, started to walk and passed  out.   W/u at the ER  Pelvic x-ray and chest x-ray: Negative, no acute fractures.  CT head no acute except for scalp hematoma without calvarium fracture.  CBC and BMP normal     She actually had another episode this morning, her daughter decided not to take her to the ER and preferred to bring her here.  Today's episode was the same: She is stood up, took a couple of steps and fell to the floor.  She is not sure if she lost consciousness, currently with no headache or neck pain.  No nausea or vomiting. At the time of the events did not have any slurred speech, facial deficits.  She still has diplopia.   Also saw pulmonary in regards nodule and the middle lobe, R lung.  Note reviewed and summarized below.  Review of Systems See above   Past Medical History:  Diagnosis Date   Allergy    Arthritis    BCC (basal cell carcinoma of skin)    GERD (gastroesophageal reflux disease)    Glaucoma suspect    HOH (hard of hearing)    Osteoporosis     Past Surgical History:  Procedure Laterality Date   ABDOMINAL HYSTERECTOMY     still have cervix   ARTERY BIOPSY Right 10/06/2014   Procedure: BIOPSY TEMPORAL ARTERY;  Surgeon: Armandina Gemma, MD;  Location: [facility];  Service: General;  Laterality: Right;   BREAST EXCISIONAL BIOPSY Right    over 20 years ago; lymph nodes removed   CATARACT EXTRACTION  10/2011   Left   colonscopy  2007   INNER EAR SURGERY  1974   both stapes replaced-steal   TUBAL LIGATION      Allergies as of 07/08/2021       Reactions   Tetanus Toxoids    Arm swelling    Penicillins    Sulfacetamide Sodium         Medication List        Accurate as of  July 08, 2021  4:12 PM. If you have any questions, ask your nurse or doctor.          STOP taking these medications    alendronate 70 MG tablet Commonly known as: FOSAMAX Stopped by: Octavio Graves Icard, DO   melatonin 3 MG Tabs tablet Stopped by: Garner Nash, DO       TAKE these medications    aspirin EC 81 MG tablet Take 81 mg by mouth daily. Swallow whole.   atorvastatin 20 MG tablet Commonly known as: LIPITOR Take 1 tablet (20 mg total) by mouth at bedtime.   B-12 1000 MCG Tabs Take by mouth.   calcium citrate-vitamin D 315-200 MG-UNIT tablet Commonly known as: CITRACAL+D Take 1 tablet by mouth daily.   fish oil-omega-3 fatty acids 1000 MG capsule Take 2 g by mouth daily.   fluticasone 50 MCG/ACT nasal spray Commonly known as: FLONASE SHAKE WELL AND USE 2 SPRAYS IN EACH NOSTRIL DAILY   PARoxetine 10 MG tablet Commonly known as: Paxil 1 tablet daily for 2 weeks, then 2 tablets daily   PreserVision AREDS 2 Caps Take  1 capsule by mouth 2 (two) times daily.   zolpidem 5 MG tablet Commonly known as: AMBIEN Take 1 tablet (5 mg total) by mouth at bedtime as needed.           Objective:   Physical Exam BP 126/72 (BP Location: Right Arm, Cuff Size: Normal)    Pulse 81    Temp 98.1 F (36.7 C) (Oral)    Resp 12    Ht 5\' 3"  (1.6 m)    Wt 108 lb 12.8 oz (49.4 kg)    SpO2 93%    BMI 19.27 kg/m  General:   Well developed, NAD, BMI noted. HEENT:  Normocephalic . Face symmetric, atraumatic Lungs:  CTA B Normal respiratory effort, no intercostal retractions, no accessory muscle use. Heart: RRR,  no murmur.  Lower extremities: no pretibial edema bilaterally  Skin: Not pale. Not jaundice Neurologic:  alert & oriented X3.  Speech normal, gait appropriate for age and unassisted. EOMI, face symmetric, tongue midline. Psych--  Cognition and judgment appear intact.  Cooperative with normal attention span and concentration.  Behavior appropriate. No  anxious or depressed appearing.      Assessment      Assessment, transferring from Dr. Lianne Cure (01/2018)  Allergies GERD Osteoporosis: Had actonel before  -Tscore  -2.2 (10-2017) Insomnia H/o polymyalgia rheumatica; used to see Dr Charlestine Night, [facility]. Bx 2016 Palpable abdominal aorta: Abdominal ultrasound  09/2018 negative for AAA B12 deficiency    PLAN Syncope: Since the last visit had 2 syncopes both in the context of standing up and getting dizzy. Never had syncopal episodes before. She started  Paxil earlier this month.  Although I doubt SSRIs are contributing to the issue I will switch her to a different agent, see below. I'm concerned about her second syncope but the family elected not to take her to the ER  today.  We agreed to refer her to cards, until then, rec  extreme caution when she gets up, be supervised 24/7 and good hydration. Plan:  Cardiology referral DDx includes arrhythmia, vasovagal events, petus excavatum (see pulmonary note). TIA: The patient developed diplopia and went to the ER 06/27/2021. Recent carotid ultrasound 0 to 39% bilaterally, MRI of the brain to be done tomorrow, echocardiogram to be done in 3 days. Neurology evaluation pending. Lung nodule: The nodule at the right lung looks inflammatory on CT.  They agree on doing a PET scan next visit in 2 weeks  Also, pulmonary noted severe Petrem excavatum, that could explain some of her DOE.  Question of her recent syncope could  be related to compression of the right atrium by the thorax.  They agreed to pursue echo. Pectus excavatum: See above Anxiety: started on Paxil earlier this month, it is helping however since she started Paxil she had 2 syncopes. Plan:d/c Paxil to 10 mg a day for the 5 days then stop Start BuSpar 7.5 mg twice daily RTC 1 month  Time spent 32 min, d/t chart review and extensive discussion of next steps This visit occurred during the SARS-CoV-2 public health emergency.  Safety  protocols were in place, including screening questions prior to the visit, additional usage of staff PPE, and extensive cleaning of exam room while observing appropriate contact time as indicated for disinfecting solutions.

## 2021-07-08 NOTE — Progress Notes (Signed)
Synopsis: Referred in December 2022 for lung nodule by Colon Branch, MD  Subjective:   PATIENT ID: Savannah Jimenez GENDER: female DOB: August 05, 1935, MRN: [id number]  Chief Complaint  Patient presents with   Consult    Pt is here today due to an abnormal CT. Pt states that at times she does breathe a little harder than usual when ambulating.     This is a 85 year old female, past medical history of basal cell carcinoma of the skin, osteoporosis, arthritis, gastroesophageal reflux disease.  Patient is a never smoker.Patient had a CT scan of the chest on 06/27/2021 in the emergency department.  Patient was found to have a right middle lobe 22 mm nodular opacity concerning for possible underlying malignancy.  Addition could represent a inflammatory or infectious lesion.  Patient also has significant pectus excavatum with compression of the heart and right ventricle.  CT imaging reviewed today in the office.  CT imaging was completed after fall evaluation in the emergency department.  OV 07/08/2021: Here today for follow-up regarding abnormal CT imaging.  She also has complaints of shortness of breath.  This has been slowly progressive as she has gotten older.  Predominantly associated with exertion.  She also has had 2 episodes of passing out this month.  Seen in the ER with concern of TIA also followed up with ophthalmologist.  She has echocardiogram ordered by her primary care provider and has follow-up with PCP Dr. Larose Kells today.   Past Medical History:  Diagnosis Date   Allergy    Arthritis    BCC (basal cell carcinoma of skin)    GERD (gastroesophageal reflux disease)    Glaucoma suspect    HOH (hard of hearing)    Osteoporosis      Family History  Problem Relation Age of Onset   Heart failure Mother    Hypertension Sister    Asthma Sister    Hypertension Brother      Past Surgical History:  Procedure Laterality Date   ABDOMINAL HYSTERECTOMY     still have cervix   ARTERY BIOPSY  Right 10/06/2014   Procedure: BIOPSY TEMPORAL ARTERY;  Surgeon: Armandina Gemma, MD;  Location: [facility];  Service: General;  Laterality: Right;   BREAST EXCISIONAL BIOPSY Right    over 20 years ago; lymph nodes removed   CATARACT EXTRACTION  10/2011   Left   colonscopy  2007   INNER EAR SURGERY  1974   both stapes replaced-steal   TUBAL LIGATION      Social History   Socioeconomic History   Marital status: Married    Spouse name: Not on file   Number of children: 2   Years of education: Not on file   Highest education level: Not on file  Occupational History   Occupation: retired , day care  Tobacco Use   Smoking status: Never   Smokeless tobacco: Never  Substance and Sexual Activity   Alcohol use: Yes    Comment: rarely   Drug use: No   Sexual activity: Not on file  Other Topics Concern   Not on file  Social History Narrative   Household: pt and husband       2 g- children   2 g-g- children   Social Determinants of Health   Financial Resource Strain: Low Risk    Difficulty of Paying Living Expenses: Not hard at all  Food Insecurity: No Food Insecurity   Worried About Charity fundraiser in  the Last Year: Never true   Ran Out of Food in the Last Year: Never true  Transportation Needs: No Transportation Needs   Lack of Transportation (Medical): No   Lack of Transportation (Non-Medical): No  Physical Activity: Sufficiently Active   Days of Exercise per Week: 3 days   Minutes of Exercise per Session: 50 min  Stress: No Stress Concern Present   Feeling of Stress : Not at all  Social Connections: Moderately Integrated   Frequency of Communication with Friends and Family: More than three times a week   Frequency of Social Gatherings with Friends and Family: More than three times a week   Attends Religious Services: More than 4 times per year   Active Member of [employer] or Organizations: No   Attends Archivist Meetings: Never   Marital Status:  Married  Human resources officer Violence: Not At Risk   Fear of Current or Ex-Partner: No   Emotionally Abused: No   Physically Abused: No   Sexually Abused: No     Allergies  Allergen Reactions   Tetanus Toxoids     Arm swelling    Penicillins    Sulfacetamide Sodium      Outpatient Medications Prior to Visit  Medication Sig Dispense Refill   aspirin EC 81 MG tablet Take 81 mg by mouth daily. Swallow whole.     atorvastatin (LIPITOR) 20 MG tablet Take 1 tablet (20 mg total) by mouth at bedtime. 90 tablet 0   calcium citrate-vitamin D (CITRACAL+D) 315-200 MG-UNIT tablet Take 1 tablet by mouth daily.     Cyanocobalamin (B-12) 1000 MCG TABS Take by mouth.     fish oil-omega-3 fatty acids 1000 MG capsule Take 2 g by mouth daily.     fluticasone (FLONASE) 50 MCG/ACT nasal spray SHAKE WELL AND USE 2 SPRAYS IN EACH NOSTRIL DAILY 16 g 3   Multiple Vitamins-Minerals (PRESERVISION AREDS 2) CAPS Take 1 capsule by mouth 2 (two) times daily.      PARoxetine (PAXIL) 10 MG tablet 1 tablet daily for 2 weeks, then 2 tablets daily 60 tablet 2   zolpidem (AMBIEN) 5 MG tablet Take 1 tablet (5 mg total) by mouth at bedtime as needed. 90 tablet 0   alendronate (FOSAMAX) 70 MG tablet TAKE 1 TABLET BY MOUTH  WEEKLY WITH A FULL GLASS OF WATER ON AN EMPTY STOMACH (Patient not taking: Reported on 06/15/2021) 12 tablet 3   Melatonin 3 MG TABS Take 1 tablet by mouth at bedtime.     No facility-administered medications prior to visit.    Review of Systems  Constitutional:  Negative for chills, fever, malaise/fatigue and weight loss.  HENT:  Negative for hearing loss, sore throat and tinnitus.   Eyes:  Negative for blurred vision and double vision.  Respiratory:  Positive for shortness of breath. Negative for cough, hemoptysis, sputum production, wheezing and stridor.   Cardiovascular:  Negative for chest pain, palpitations, orthopnea, leg swelling and PND.  Gastrointestinal:  Negative for abdominal pain,  constipation, diarrhea, heartburn, nausea and vomiting.  Genitourinary:  Negative for dysuria, hematuria and urgency.  Musculoskeletal:  Negative for joint pain and myalgias.  Skin:  Negative for itching and rash.  Neurological:  Negative for dizziness, tingling, weakness and headaches.       Syncope  Endo/Heme/Allergies:  Negative for environmental allergies. Does not bruise/bleed easily.  Psychiatric/Behavioral:  Negative for depression. The patient is not nervous/anxious and does not have insomnia.   All other systems reviewed and  are negative.   Objective:  Physical Exam Vitals reviewed.  Constitutional:      General: She is not in acute distress.    Appearance: She is well-developed.  HENT:     Head: Normocephalic and atraumatic.  Eyes:     General: No scleral icterus.    Conjunctiva/sclera: Conjunctivae normal.     Pupils: Pupils are equal, round, and reactive to light.  Neck:     Vascular: No JVD.     Trachea: No tracheal deviation.  Cardiovascular:     Rate and Rhythm: Normal rate and regular rhythm.     Heart sounds: Normal heart sounds. No murmur heard.    Comments: Pectus excavatum Pulmonary:     Effort: Pulmonary effort is normal. No tachypnea, accessory muscle usage or respiratory distress.     Breath sounds: No stridor. No wheezing, rhonchi or rales.  Abdominal:     General: Bowel sounds are normal. There is no distension.     Palpations: Abdomen is soft.     Tenderness: There is no abdominal tenderness.  Musculoskeletal:        General: No tenderness.     Cervical back: Neck supple.  Lymphadenopathy:     Cervical: No cervical adenopathy.  Skin:    General: Skin is warm and dry.     Capillary Refill: Capillary refill takes less than 2 seconds.     Findings: No rash.  Neurological:     Mental Status: She is alert and oriented to person, place, and time.  Psychiatric:        Behavior: Behavior normal.     Vitals:   07/08/21 1343  BP: 106/64  Pulse:  95  Temp: 97.9 F (36.6 C)  TempSrc: Oral  SpO2: 95%  Weight: 107 lb 6.4 oz (48.7 kg)  Height: 5\' 3"  (1.6 m)   95% on RA BMI Readings from Last 3 Encounters:  07/08/21 19.03 kg/m  07/06/21 18.78 kg/m  06/29/21 18.78 kg/m   Wt Readings from Last 3 Encounters:  07/08/21 107 lb 6.4 oz (48.7 kg)  07/06/21 106 lb (48.1 kg)  06/29/21 106 lb (48.1 kg)     CBC    Component Value Date/Time   WBC 6.2 07/06/2021 1116   RBC 3.92 07/06/2021 1116   HGB 12.2 07/06/2021 1116   HCT 37.1 07/06/2021 1116   PLT 205 07/06/2021 1116   MCV 94.6 07/06/2021 1116   MCH 31.1 07/06/2021 1116   MCHC 32.9 07/06/2021 1116   RDW 13.2 07/06/2021 1116   LYMPHSABS 0.8 07/06/2021 1116   MONOABS 0.4 07/06/2021 1116   EOSABS 0.0 07/06/2021 1116   BASOSABS 0.0 07/06/2021 1116     Chest Imaging: 06/27/2021 CT chest: 22 mm irregular shaped nodular opacity adjacent to the pleura of the right middle lobe.  Could represent underlying malignancy versus inflammatory lesion.  More of the associated changes within the area are suggestive of inflammatory lesion. The patient's images have been independently reviewed by me.    Pulmonary Functions Testing Results: No flowsheet data found.  FeNO:   Pathology:   Echocardiogram:   Heart Catheterization:     Assessment & Plan:     ICD-10-CM   1. Nodule of middle lobe of right lung  R91.1 NM PET Image Initial (PI) Skull Base To Thigh (F-18 FDG)    2. Pectus excavatum  Q67.6     3. DOE (dyspnea on exertion)  R06.09     4. Restrictive lung disease  J98.4  Discussion:  This is a 85 year old female, she has pectus excavatum with approximately 4 cm between her posterior sternum and vertebral body.  Patient has a Haller index of 5.275 based on measurements of thoracic cage.  People with an index of greater than 3.25 are often considered for reconstruction of the chest due to potential hemodynamic consequences and restrictive thoracic cage physiology  which can lead to the development of shortness of breath.  Plan: Due to the patient's age she is obviously not a candidate for surgery to correct her pectus excavatum.  She does have severe disease which I believe helps explain some of her dyspnea on exertion and shortness of breath. A question whether or not even some of her syncope could be related to exertion and the compression of her right atrium as seen in CT imaging. I agree with echocardiogram for further evaluation. As for the patient's lung nodule it looks inflammatory to me based on CT.  She is slightly worried about this.  She has no other previous malignancies or family history of lung cancer.  She herself is a lifelong non-smoker. We talked about the pros cons of undergoing follow-up imaging, pet imaging versus tissue sampling.  I think the next best step would be obtaining a PET scan.  I think this will help potentially relieve some anxiety.  If it is a low-level metabolism uptake I think we should then follow-up with a repeat CT in 3 months.  If it is super hypermetabolic then we can consider tissue sampling we talked about what that would entail as well undergoing robotic assisted navigational bronchoscopy. Patient to follow-up with [place] in a couple weeks after her PET scan to discuss next steps    Current Outpatient Medications:    aspirin EC 81 MG tablet, Take 81 mg by mouth daily. Swallow whole., Disp: , Rfl:    atorvastatin (LIPITOR) 20 MG tablet, Take 1 tablet (20 mg total) by mouth at bedtime., Disp: 90 tablet, Rfl: 0   calcium citrate-vitamin D (CITRACAL+D) 315-200 MG-UNIT tablet, Take 1 tablet by mouth daily., Disp: , Rfl:    Cyanocobalamin (B-12) 1000 MCG TABS, Take by mouth., Disp: , Rfl:    fish oil-omega-3 fatty acids 1000 MG capsule, Take 2 g by mouth daily., Disp: , Rfl:    fluticasone (FLONASE) 50 MCG/ACT nasal spray, SHAKE WELL AND USE 2 SPRAYS IN EACH NOSTRIL DAILY, Disp: 16 g, Rfl: 3   Multiple Vitamins-Minerals  (PRESERVISION AREDS 2) CAPS, Take 1 capsule by mouth 2 (two) times daily. , Disp: , Rfl:    PARoxetine (PAXIL) 10 MG tablet, 1 tablet daily for 2 weeks, then 2 tablets daily, Disp: 60 tablet, Rfl: 2   zolpidem (AMBIEN) 5 MG tablet, Take 1 tablet (5 mg total) by mouth at bedtime as needed., Disp: 90 tablet, Rfl: 0  I spent  62 minutes dedicated to the care of this patient on the date of this encounter to include pre-visit review of records, face-to-face time with the patient discussing conditions above, post visit ordering of testing, clinical documentation with the electronic health record, making appropriate referrals as documented, and communicating necessary findings to members of the patients care team.   Garner Nash, DO [facility] 07/08/2021 2:35 PM

## 2021-07-08 NOTE — Patient Instructions (Signed)
Good hydration  Take Paxil 10 mg 1 tablet for the next 5 days then stop  Start buspirone 7.5 mg 1 tablet twice a day for anxiety  Next visit in 1 month, please make an appointment

## 2021-07-09 ENCOUNTER — Ambulatory Visit (HOSPITAL_BASED_OUTPATIENT_CLINIC_OR_DEPARTMENT_OTHER)
Admission: RE | Admit: 2021-07-09 | Discharge: 2021-07-09 | Disposition: A | Discharge status: Home / Self Care | Payer: Medicare Other | Source: Ambulatory Visit | Attending: Internal Medicine | Admitting: Internal Medicine

## 2021-07-09 ENCOUNTER — Encounter (HOSPITAL_BASED_OUTPATIENT_CLINIC_OR_DEPARTMENT_OTHER): Payer: Self-pay

## 2021-07-09 DIAGNOSIS — F419 Anxiety disorder, unspecified: Secondary | ICD-10-CM | POA: Insufficient documentation

## 2021-07-09 NOTE — Assessment & Plan Note (Signed)
Syncope: Since the last visit had 2 syncopes both in the context of standing up and getting dizzy. Never had syncopal episodes before. She started  Paxil earlier this month.  Although I doubt SSRIs are contributing to the issue I will switch her to a different agent, see below. I'm concerned about her second syncope but the family elected not to take her to the ER  today.  We agreed to refer her to cards, until then, rec  extreme caution when she gets up, be supervised 24/7 and good hydration. Plan:  Cardiology referral DDx includes arrhythmia, vasovagal events, petus excavatum (see pulmonary note). TIA: The patient developed diplopia and went to the ER 06/27/2021. Recent carotid ultrasound 0 to 39% bilaterally, MRI of the brain to be done tomorrow, echocardiogram to be done in 3 days. Neurology evaluation pending. Lung nodule: The nodule at the right lung looks inflammatory on CT.  They agree on doing a PET scan next visit in 2 weeks  Also, pulmonary noted severe Petrem excavatum, that could explain some of her DOE.  Question of her recent syncope could  be related to compression of the right atrium by the thorax.  They agreed to pursue echo. Pectus excavatum: See above Anxiety: started on Paxil earlier this month, it is helping however since she started Paxil she had 2 syncopes. Plan:d/c Paxil to 10 mg a day for the 5 days then stop Start BuSpar 7.5 mg twice daily RTC 1 month

## 2021-07-11 ENCOUNTER — Ambulatory Visit (HOSPITAL_COMMUNITY)
Admission: RE | Admit: 2021-07-11 | Discharge: 2021-07-11 | Disposition: A | Discharge status: Home / Self Care | Payer: Medicare Other | Source: Ambulatory Visit | Attending: Internal Medicine | Admitting: Internal Medicine

## 2021-07-11 ENCOUNTER — Other Ambulatory Visit: Payer: Self-pay

## 2021-07-11 DIAGNOSIS — G459 Transient cerebral ischemic attack, unspecified: Secondary | ICD-10-CM | POA: Diagnosis not present

## 2021-07-12 ENCOUNTER — Ambulatory Visit (INDEPENDENT_AMBULATORY_CARE_PROVIDER_SITE_OTHER): Payer: Medicare Other | Admitting: Cardiovascular Disease

## 2021-07-12 ENCOUNTER — Ambulatory Visit: Payer: Medicare Other | Admitting: Internal Medicine

## 2021-07-12 ENCOUNTER — Encounter: Payer: Self-pay | Admitting: Cardiovascular Disease

## 2021-07-12 ENCOUNTER — Ambulatory Visit (INDEPENDENT_AMBULATORY_CARE_PROVIDER_SITE_OTHER): Payer: Medicare Other

## 2021-07-12 VITALS — BP 125/82 | HR 85 | Ht 64.0 in | Wt 107.2 lb

## 2021-07-12 DIAGNOSIS — I700 Atherosclerosis of aorta: Secondary | ICD-10-CM

## 2021-07-12 DIAGNOSIS — I951 Orthostatic hypotension: Secondary | ICD-10-CM | POA: Diagnosis not present

## 2021-07-12 DIAGNOSIS — E7800 Pure hypercholesterolemia, unspecified: Secondary | ICD-10-CM | POA: Diagnosis not present

## 2021-07-12 DIAGNOSIS — R55 Syncope and collapse: Secondary | ICD-10-CM

## 2021-07-12 NOTE — Progress Notes (Signed)
Cardiology Office Note:    Date:  07/15/2021   ID:  [name], DOB 11-22-1935, MRN [id number]  PCP:  Colon Branch, MD   [facility] Providers Cardiologist:  Sanda Klein, MD     Referring MD: Colon Branch, MD   Chief Complaint  Patient presents with   Loss of Consciousness       Savannah Jimenez is a 85 y.o. female who is being seen today for the evaluation of syncope at the request of Colon Branch, MD.   History of Present Illness:    Savannah Jimenez is a 85 y.o. female with a hx of otosclerosis status post metallic middle ear implants in youth, hyperlipidemia on statin therapy, referred after complaints of dizziness and an episode of syncope.  She describes her self as "always dizzy" when she stands up.  Learned to take her time when changing position to avoid being unsteady.  She has had 2 recent falls.  On 07/06/2021 she was seen in the emergency room for syncopal event.  She stood up and after taking several steps lost consciousness and fell in the hallway.  She struck her head and had a small occipital hematoma.  She was evaluated in the emergency room and did not have any evidence of intracranial hemorrhage.  Her ECG showed sinus rhythm with some minor conduction abnormalities and labs were normal  2 days later she had another fall at home, this time on carpet, similarly after she stood up and took about 3 steps.  This episode was not associated with syncope.  About 3 weeks prior to these events she developed complaints of double vision when looking to 1 side.  She saw an ophthalmologist and was diagnosed with a right ocular palsy and suspicion for stroke.  A CT of the head was unrevealing and she cannot have an MRI due to her metallic implants.  Her blood pressure was high during the episodes of diplopia.  She has been taking paroxetine, but this was started recently and her problems with orthostatic dizziness preceded the paroxetine.  Denies problems with exertional angina  or dyspnea, Orthopnea, PND, palpitations.  Her episodes of dizziness only occur with changes in position, they do not occur randomly.  Her orthostatic dizziness is a lifelong condition.  She remembers this occurring as far back as 60 years ago.  She has an apple watch that occasionally shows irregular rhythm.  She has not recorded any ECG tracings.  Recent chest CT does show some atherosclerotic calcifications of the thoracic aorta, which is normal in caliber.  This was not a gated, cardiac dedicated study, but on my review I believe there is relatively mild calcification in the mid LAD artery, not a whole lot of coronary atherosclerosis.  During her work-up for the current problems she has been identified as having a lung nodule and is seeing Dr. Valeta Harms,  who has ordered a PET scan.  She underwent a carotid duplex ultrasound that showed normal findings.  He does not have a history of coronary or peripheral arterial disease.  She has never previously been diagnosed with stroke or TIA.  She does not have diabetes mellitus and is not a smoker.  Her most recent lipid profile (this might precedes treatment with statins) shows an LDL cholesterol 138 and HDL of 63.  Going back many years, it appears that her baseline LDL cholesterol level is around 150 and her baseline total cholesterol level is around 250.  She always  has an excellent HDL.  She has recent normal thyroid function tests.  Today in the office her blood pressure was 112/72 sitting down and dropped immediately to 76/49 mmHg within less than 60 seconds of standing up.  She felt dizzy.  We did not check any additional orthostatic vital signs.  Past Medical History:  Diagnosis Date   Allergy    Arthritis    BCC (basal cell carcinoma of skin)    GERD (gastroesophageal reflux disease)    Glaucoma suspect    HOH (hard of hearing)    Osteoporosis     Past Surgical History:  Procedure Laterality Date   ABDOMINAL HYSTERECTOMY     still have  cervix   ARTERY BIOPSY Right 10/06/2014   Procedure: BIOPSY TEMPORAL ARTERY;  Surgeon: Armandina Gemma, MD;  Location: [facility];  Service: General;  Laterality: Right;   BREAST EXCISIONAL BIOPSY Right    over 20 years ago; lymph nodes removed   CATARACT EXTRACTION  10/2011   Left   colonscopy  2007   INNER EAR SURGERY  1974   both stapes replaced-steal   TUBAL LIGATION      Current Medications: Current Meds  Medication Sig   aspirin EC 81 MG tablet Take 81 mg by mouth daily. Swallow whole.   atorvastatin (LIPITOR) 20 MG tablet Take 1 tablet (20 mg total) by mouth at bedtime.   busPIRone (BUSPAR) 7.5 MG tablet Take 1 tablet (7.5 mg total) by mouth 2 (two) times daily.   calcium citrate-vitamin D (CITRACAL+D) 315-200 MG-UNIT tablet Take 1 tablet by mouth daily.   Cyanocobalamin (B-12) 1000 MCG TABS Take by mouth.   fish oil-omega-3 fatty acids 1000 MG capsule Take 2 g by mouth daily.   fluticasone (FLONASE) 50 MCG/ACT nasal spray SHAKE WELL AND USE 2 SPRAYS IN EACH NOSTRIL DAILY   Multiple Vitamins-Minerals (PRESERVISION AREDS 2) CAPS Take 1 capsule by mouth 2 (two) times daily.    zolpidem (AMBIEN) 5 MG tablet Take 1 tablet (5 mg total) by mouth at bedtime as needed.     Allergies:   Tetanus toxoids, Penicillins, and Sulfacetamide sodium   Social History   Socioeconomic History   Marital status: Married    Spouse name: Not on file   Number of children: 2   Years of education: Not on file   Highest education level: Not on file  Occupational History   Occupation: retired , day care  Tobacco Use   Smoking status: Never   Smokeless tobacco: Never  Substance and Sexual Activity   Alcohol use: Yes    Comment: rarely   Drug use: No   Sexual activity: Not on file  Other Topics Concern   Not on file  Social History Narrative   Household: pt and husband       2 g- children   2 g-g- children   Social Determinants of Health   Financial Resource Strain: Not on  file  Food Insecurity: Not on file  Transportation Needs: Not on file  Physical Activity: Not on file  Stress: Not on file  Social Connections: Not on file     Family History: The patient's family history includes Asthma in her sister; Heart failure in her mother; Hypertension in her brother and sister.  ROS:   Please see the history of present illness.     All other systems reviewed and are negative.  EKGs/Labs/Other Studies Reviewed:    The following studies were reviewed today: Notes from  ED evaluation earlier this month including imaging studies and labs and ECG tracings Carotid duplex ultrasound 07/05/2021 Summary:  Right Carotid: Velocities in the right ICA are consistent with a 1-39%  stenosis.   Left Carotid: Velocities in the left ICA are consistent with a 1-39%  stenosis.   Vertebrals:  Bilateral vertebral arteries demonstrate antegrade flow.  Subclavians: Normal flow hemodynamics were seen in bilateral subclavian               arteries.    EKG:  EKG is ordered today.  The ekg ordered today demonstrates normal sinus rhythm with biatrial enlargement and no repolarization abnormalities, QTC 440 ms.  Recent Labs: 11/12/2020: ALT 20 06/23/2021: TSH 1.85 07/06/2021: BUN 25; Creatinine, Ser 0.73; Hemoglobin 12.2; Platelets 205; Potassium 4.0; Sodium 136  Recent Lipid Panel    Component Value Date/Time   CHOL 225 (H) 12/01/2019 1022   TRIG 113.0 12/01/2019 1022   HDL 63.70 12/01/2019 1022   CHOLHDL 4 12/01/2019 1022   VLDL 22.6 12/01/2019 1022   LDLCALC 138 (H) 12/01/2019 1022   LDLDIRECT 143.5 05/31/2012 1016     Risk Assessment/Calculations:           Physical Exam:    VS:  BP 125/82    Pulse 85    Ht _0  (1.626 m)    Wt 107 lb 3.2 oz (48.6 kg)    SpO2 91%    BMI 18.40 kg/m     Wt Readings from Last 3 Encounters:  07/12/21 107 lb 3.2 oz (48.6 kg)  07/08/21 108 lb 12.8 oz (49.4 kg)  07/08/21 107 lb 6.4 oz (48.7 kg)     GEN: Exceedingly lean,  otherwise appears well nourished, well developed in no acute distress HEENT: Normal NECK: No JVD; No carotid bruits LYMPHATICS: No lymphadenopathy CARDIAC: RRR, no murmurs, rubs, gallops RESPIRATORY:  Clear to auscultation without rales, wheezing or rhonchi  ABDOMEN: Soft, non-tender, non-distended MUSCULOSKELETAL:  No edema; No deformity  SKIN: Warm and dry NEUROLOGIC:  Alert and oriented x 3 PSYCHIATRIC:  Normal affect   ASSESSMENT:    1. Syncope, unspecified syncope type   2. Orthostatic hypotension   3. Hypercholesterolemia   4. Atherosclerosis of aorta (HCC)    PLAN:    In order of problems listed above:  Syncope: This has uniformly occurred shortly after standing up from a sitting position and is highly compatible with orthostatic hypotension.  She has not had any random episodes of palpitations, dizziness or syncope at rest that would suggest arrhythmia.  Her recovery was quick.  She has not had convulsions, bowel/bladder incontinence or postictal confusion.  In view of her age and the evidence of some conduction abnormalities on her ECG, its important to exclude bradycardia as a cause of syncope and we will have her wear an event monitor.  Also check an echo for any structural abnormalities. Orthostatic hypotension: Checked today in the office, this was severe with a drop in systolic blood pressure of 36 mmHg within a few seconds of standing up.  Discussed the mechanism and causes of orthostatic hypotension.  She is not on any medications that are known to cause this, but it does sound like she can improve her hydration level.  She does not have hypertension and I encouraged her to eat a diet liberal in salt.  Also recommended compression stockings, preferably thigh-high or compression pantyhose.  Also reviewed the fact that she might need to occasionally use an abdominal binder, when she has to  frequently stand up or has prolonged orthostasis.  She needs to change position very  gradually and slowly and take her time before starting to walk.  Avoid diuretics, alpha blockers, SSRI and other medications that can worsen orthostatic hypotension.  We reviewed option for treatment with medications such as fludrocortisone, midodrine or droxidopa but discussed the fact that these medications have their own drawbacks and side effects.  Would definitely try conservative measures first. Hypercholesterolemia:  In view of the presence of aortic atherosclerosis, it is very reasonable to try to treat her cholesterol level to an LDL less than 70.  She has started on a highly active statin and will follow up with her primary care provider. Aortic atherosclerosis: This is not particularly prominent for her age and the aorta is normal in caliber.  There is not a lot of atherosclerosis seen in the arch vessels, coronary arteries, visceral branches included in the study.  Does not have symptoms of PAD. Ocular palsy/TIA: She has an appointment with a neurologist on January 4.  Unfortunately, she cannot have an MRI due to her middle ear implants.           Medication Adjustments/Labs and Tests Ordered: Current medicines are reviewed at length with the patient today.  Concerns regarding medicines are outlined above.  Orders Placed This Encounter  Procedures   LONG TERM MONITOR-LIVE TELEMETRY (3-14 DAYS)   EKG 12-Lead   No orders of the defined types were placed in this encounter.   Patient Instructions  Medication Instructions:  No changes *If you need a refill on your cardiac medications before your next appointment, please call your pharmacy*   Lab Work: None ordered If you have labs (blood work) drawn today and your tests are completely normal, you will receive your results only by: [place] (if you have MyChart) OR A paper copy in the mail If you have any lab test that is abnormal or we need to change your treatment, we will call you to review the  results.   Testing/Procedures: ZIO AT Long term monitor-Live Telemetry  Your physician has requested you wear a ZIO patch monitor for 14 days.  This is a single patch monitor. [employer] supplies one patch monitor per enrollment. Additional  stickers are not available.  Please do not apply patch if you will be having a Nuclear Stress Test, Echocardiogram, Cardiac CT, MRI,  or Chest Xray during the period you would be wearing the monitor. The patch cannot be worn during  these tests. You cannot remove and re-apply the ZIO AT patch monitor.  Your ZIO patch monitor will be mailed 3 day USPS to your address on file. It may take 3-5 days to  receive your monitor after you have been enrolled.  Once you have received your monitor, please review the enclosed instructions. Your monitor has  already been registered assigning a specific monitor serial # to you.   Billing and Patient Assistance Program information  Theodore Demark has been supplied with any insurance information on record for billing. [employer] offers a sliding scale Patient Assistance Program for patients without insurance, or whose  insurance does not completely cover the cost of the ZIO patch monitor. You must apply for the  Patient Assistance Program to qualify for the discounted rate. To apply, call [employer] at [phone],  select option 4, select option 2 , ask to apply for the Patient Assistance Program, (you can request an  interpreter if needed). [employer] will ask your household income and how many  people are in your  household. [employer] will quote your out-of-pocket cost based on this information. They will also be able  to set up a 12 month interest free payment plan if needed.  Applying the monitor   Shave hair from upper left chest.  Hold the abrader disc by orange tab. Rub the abrader in 40 strokes over left upper chest as indicated in  your monitor instructions.  Clean area with 4 enclosed alcohol pads. Use all pads to ensure  the area is cleaned thoroughly. Let  dry.  Apply patch as indicated in monitor instructions. Patch will be placed under collarbone on left side of  chest with arrow pointing upward.  Rub patch adhesive wings for 2 minutes. Remove the white label marked "1". Remove the white label  marked "2". Rub patch adhesive wings for 2 additional minutes.  While looking in a mirror, press and release button in center of patch. A small green light will flash 3-4  times. This will be your only indicator that the monitor has been turned on.  Do not shower for the first 24 hours. You may shower after the first 24 hours.  Press the button if you feel a symptom. You will hear a small click. Record Date, Time and Symptom in  the Patient Log.   Starting the Gateway  In your kit there is a Hydrographic surveyor box the size of a cellphone. This is Airline pilot. It transmits all your  recorded data to [facility]. This box must always stay within 10 feet of you. Open the box and push the *  button. There will be a light that blinks orange and then green a few times. When the light stops  blinking, the Gateway is connected to the ZIO patch. Call [employer] at [phone] to confirm your monitor is transmitting.  Returning your monitor  Remove your patch and place it inside the [place]. In the lower half of the Gateway there is a white  bag with prepaid postage on it. Place Gateway in bag and seal. Mail package back to [place] as soon as  possible. Your physician should have your final report approximately 7 days after you have mailed back  your monitor. Call [place] at [phone] if you have questions regarding your ZIO AT  patch monitor. Call them immediately if you see an orange light blinking on your monitor.  If your monitor falls off in less than 4 days, contact our Monitor department at [phone]. If your  monitor becomes loose or falls off after 4 days call [employer] at [phone]  for suggestions on  securing your monitor    Follow-Up: At [facility], you and your health needs are our priority.  As part of our continuing mission to provide you with exceptional heart care, we have created designated Provider Care Teams.  These Care Teams include your primary Cardiologist (physician) and Advanced Practice Providers (APPs -  Physician Assistants and Nurse Practitioners) who all work together to provide you with the care you need, when you need it.  We recommend signing up for the patient portal called "MyChart".  Sign up information is provided on this After Visit Summary.  MyChart is used to connect with patients for Virtual Visits (Telemedicine).  Patients are able to view lab/test results, encounter notes, upcoming appointments, etc.  Non-urgent messages can be sent to your provider as well.   To learn more about what you can do with MyChart, go to [email or web address].  Your next appointment:   Follow up in 6 weeks with APP    Signed, Sanda Klein, MD  07/15/2021 7:08 PM    [place]

## 2021-07-12 NOTE — Patient Instructions (Signed)
Medication Instructions:  No changes *If you need a refill on your cardiac medications before your next appointment, please call your pharmacy*   Lab Work: None ordered If you have labs (blood work) drawn today and your tests are completely normal, you will receive your results only by: [place] (if you have MyChart) OR A paper copy in the mail If you have any lab test that is abnormal or we need to change your treatment, we will call you to review the results.   Testing/Procedures: ZIO AT Long term monitor-Live Telemetry  Your physician has requested you wear a ZIO patch monitor for 14 days.  This is a single patch monitor. [employer] supplies one patch monitor per enrollment. Additional  stickers are not available.  Please do not apply patch if you will be having a Nuclear Stress Test, Echocardiogram, Cardiac CT, MRI,  or Chest Xray during the period you would be wearing the monitor. The patch cannot be worn during  these tests. You cannot remove and re-apply the ZIO AT patch monitor.  Your ZIO patch monitor will be mailed 3 day USPS to your address on file. It may take 3-5 days to  receive your monitor after you have been enrolled.  Once you have received your monitor, please review the enclosed instructions. Your monitor has  already been registered assigning a specific monitor serial # to you.   Billing and Patient Assistance Program information  Savannah Jimenez has been supplied with any insurance information on record for billing. [employer] offers a sliding scale Patient Assistance Program for patients without insurance, or whose  insurance does not completely cover the cost of the ZIO patch monitor. You must apply for the  Patient Assistance Program to qualify for the discounted rate. To apply, call [employer] at [phone],  select option 4, select option 2 , ask to apply for the Patient Assistance Program, (you can request an  interpreter if needed). [employer] will ask your  household income and how many people are in your  household. [employer] will quote your out-of-pocket cost based on this information. They will also be able  to set up a 12 month interest free payment plan if needed.  Applying the monitor   Shave hair from upper left chest.  Hold the abrader disc by orange tab. Rub the abrader in 40 strokes over left upper chest as indicated in  your monitor instructions.  Clean area with 4 enclosed alcohol pads. Use all pads to ensure the area is cleaned thoroughly. Let  dry.  Apply patch as indicated in monitor instructions. Patch will be placed under collarbone on left side of  chest with arrow pointing upward.  Rub patch adhesive wings for 2 minutes. Remove the white label marked "1". Remove the white label  marked "2". Rub patch adhesive wings for 2 additional minutes.  While looking in a mirror, press and release button in center of patch. A small green light will flash 3-4  times. This will be your only indicator that the monitor has been turned on.  Do not shower for the first 24 hours. You may shower after the first 24 hours.  Press the button if you feel a symptom. You will hear a small click. Record Date, Time and Symptom in  the Patient Log.   Starting the Gateway  In your kit there is a Hydrographic surveyor box the size of a cellphone. This is Airline pilot. It transmits all your  recorded data to [facility]. This box must  always stay within 10 feet of you. Open the box and push the *  button. There will be a light that blinks orange and then green a few times. When the light stops  blinking, the Gateway is connected to the ZIO patch. Call [employer] at [phone] to confirm your monitor is transmitting.  Returning your monitor  Remove your patch and place it inside the [place]. In the lower half of the Gateway there is a white  bag with prepaid postage on it. Place Gateway in bag and seal. Mail package back to [place] as soon as  possible. Your  physician should have your final report approximately 7 days after you have mailed back  your monitor. Call [place] at [phone] if you have questions regarding your ZIO AT  patch monitor. Call them immediately if you see an orange light blinking on your monitor.  If your monitor falls off in less than 4 days, contact our Monitor department at [phone]. If your  monitor becomes loose or falls off after 4 days call [employer] at [phone] for suggestions on  securing your monitor    Follow-Up: At [facility], you and your health needs are our priority.  As part of our continuing mission to provide you with exceptional heart care, we have created designated Provider Care Teams.  These Care Teams include your primary Cardiologist (physician) and Advanced Practice Providers (APPs -  Physician Assistants and Nurse Practitioners) who all work together to provide you with the care you need, when you need it.  We recommend signing up for the patient portal called "MyChart".  Sign up information is provided on this After Visit Summary.  MyChart is used to connect with patients for Virtual Visits (Telemedicine).  Patients are able to view lab/test results, encounter notes, upcoming appointments, etc.  Non-urgent messages can be sent to your provider as well.   To learn more about what you can do with MyChart, go to [email or web address].    Your next appointment:   Follow up in 6 weeks with APP

## 2021-07-12 NOTE — Progress Notes (Unsigned)
Enrolled for [employer] to mail a ZIO AT Live Telemetry monitor to patients address on file.  

## 2021-07-14 LAB — ECHOCARDIOGRAM COMPLETE
Area-P 1/2: 3.91 cm2
S' Lateral: 2.9 cm

## 2021-07-21 ENCOUNTER — Ambulatory Visit (HOSPITAL_COMMUNITY)
Admission: RE | Admit: 2021-07-21 | Discharge: 2021-07-21 | Disposition: A | Discharge status: Home / Self Care | Payer: Medicare Other | Source: Ambulatory Visit | Attending: Pulmonary Disease | Admitting: Pulmonary Disease

## 2021-07-21 ENCOUNTER — Other Ambulatory Visit: Payer: Self-pay

## 2021-07-21 ENCOUNTER — Ambulatory Visit: Payer: Medicare Other

## 2021-07-21 DIAGNOSIS — I2510 Atherosclerotic heart disease of native coronary artery without angina pectoris: Secondary | ICD-10-CM | POA: Diagnosis not present

## 2021-07-21 DIAGNOSIS — J479 Bronchiectasis, uncomplicated: Secondary | ICD-10-CM | POA: Insufficient documentation

## 2021-07-21 DIAGNOSIS — M4316 Spondylolisthesis, lumbar region: Secondary | ICD-10-CM | POA: Diagnosis not present

## 2021-07-21 DIAGNOSIS — Q676 Pectus excavatum: Secondary | ICD-10-CM | POA: Diagnosis not present

## 2021-07-21 DIAGNOSIS — R911 Solitary pulmonary nodule: Secondary | ICD-10-CM | POA: Diagnosis not present

## 2021-07-21 LAB — GLUCOSE, CAPILLARY: Glucose-Capillary: 94 mg/dL (ref 70–99)

## 2021-07-21 MED ORDER — FLUDEOXYGLUCOSE F - 18 (FDG) INJECTION
5.0000 | Freq: Once | INTRAVENOUS | Status: AC | PRN
  Administered 2021-07-21: 13:00:00 5.3 via INTRAVENOUS

## 2021-07-22 ENCOUNTER — Telehealth: Payer: Self-pay | Admitting: Cardiovascular Disease

## 2021-07-22 DIAGNOSIS — R55 Syncope and collapse: Secondary | ICD-10-CM

## 2021-07-22 MED ORDER — APIXABAN 2.5 MG PO TABS: 2.5000 mg | ORAL_TABLET | Freq: Two times a day (BID) | ORAL | 6 refills | Status: DC

## 2021-07-22 MED ORDER — APIXABAN 2.5 MG PO TABS: 2.5000 mg | ORAL_TABLET | Freq: Two times a day (BID) | ORAL | 0 refills | Status: DC

## 2021-07-22 MED ORDER — METOPROLOL SUCCINATE ER 25 MG PO TB24: 12.5000 mg | ORAL_TABLET | Freq: Every day | ORAL | 2 refills | Status: DC

## 2021-07-22 NOTE — Telephone Encounter (Signed)
Abnormal EKG  

## 2021-07-22 NOTE — Telephone Encounter (Signed)
Received  all from [employer] - [employer]  Patient's monitor showed  11:11 am today - atrial flutter avg rate of 128 lasting 90 seconds. FIRST DOCUMENTATION OF [employer]    Patient monitor ordered for syncopal episode.    Current medication  Aspirin 81 mg  Atorvastatin Buspirone Vit-D/calcium FLUTICASONE CYANOCOBALAMIN  MVI  OMEGA -3 FATTY ACID ZOLPIDEM  Monitor shown  to Dr Debara Pickett   Reviewed with Dr Debara Pickett-   Per Dr Debara Pickett , stop taking Aspirin 81 mg- now Start  taking Eliquis 2.5 mg  twice a day  Toprol Xl 12.5 mg daily

## 2021-07-22 NOTE — Telephone Encounter (Signed)
Spoke to patient. Instruction given concerning medication changes and  result of  current  monitor  report.  Patient aware to continue to wear the monitor for the  entire duration.  Patient  verbalized understanding. She states she will need to find someone to bring her to the office to pick the samples Eliquis 2.5 mg .    Prescription for toprol xl 12.5 mg daily And Eliquis 2.5 mg twice a day  sent to pharmacy   Aspirin stopped

## 2021-07-23 DIAGNOSIS — R55 Syncope and collapse: Secondary | ICD-10-CM | POA: Diagnosis not present

## 2021-07-27 ENCOUNTER — Other Ambulatory Visit: Payer: Self-pay

## 2021-07-27 ENCOUNTER — Telehealth: Payer: Self-pay | Admitting: Cardiovascular Disease

## 2021-07-27 ENCOUNTER — Ambulatory Visit (INDEPENDENT_AMBULATORY_CARE_PROVIDER_SITE_OTHER): Payer: Medicare Other | Admitting: Neurology

## 2021-07-27 ENCOUNTER — Encounter: Payer: Self-pay | Admitting: Neurology

## 2021-07-27 VITALS — BP 129/76 | HR 73 | Ht 64.0 in | Wt 103.0 lb

## 2021-07-27 DIAGNOSIS — R55 Syncope and collapse: Secondary | ICD-10-CM

## 2021-07-27 DIAGNOSIS — I951 Orthostatic hypotension: Secondary | ICD-10-CM

## 2021-07-27 DIAGNOSIS — G459 Transient cerebral ischemic attack, unspecified: Secondary | ICD-10-CM | POA: Diagnosis not present

## 2021-07-27 NOTE — Telephone Encounter (Signed)
Patient would like to switch from Dr. Sallyanne Kuster to Dr. Gwenlyn Found, are you both in agreement?

## 2021-07-27 NOTE — Progress Notes (Signed)
[facility]  PATIENT: Savannah Jimenez DOB: November 18, 1935  REQUESTING CLINICIAN: Colon Branch, MD HISTORY FROM: Patient and daughter Savannah Jimenez  REASON FOR VISIT: TIA/Syncope    HISTORICAL  CHIEF COMPLAINT:  Chief Complaint  Patient presents with   New Patient (Initial Visit)    Rm 15, with daughter, C/o balance issues, hand tremors, multiple falls, states she is doing well on eliquis     HISTORY OF PRESENT ILLNESS:  This is a 86 year old woman with past medical history of atrial fibrillation, orthostatic hypotension, GERD and depression who is presenting with her daughter Savannah Jimenez for evaluation of syncope.  In brief, per Savannah Jimenez starting late fall patient lost her sister and her husband was diagnosed with early dementia.  This was a sad time for her and her doctor put her on Paxil while being on Paxil she was complaining of tiredness after walking a short distance.  Then in December she presented to the ED after complaining of double vision in the right upper quadrant, her head CT was normal and she was diagnosed with TIA.  She was put on aspirin and atorvastatin for stroke prevention.  At that time also her chest x-ray showed a lung nodule.  She has been following with pulmonology currently.  They were unable to do an MRI because she does have metal in both years.  The following week after presentation to the ED she had a syncopal episode.  She followed-up with her primary care doctor who referred her to neurology and cardiology.  Patient did follow with a cardiologist had a echocardiogram with a EF of 35%, she did also did have a heart monitor which showed atrial fibrillation.  She is currently on Eliquis and metoprolol.  She was also diagnosed with orthostatic hypotension and started with conservative management.  Her primary doctor also discontinued the Paxil and put her on BuSpar, patient reported she feels much better in the past 2 years then in the past 2 years.  She does not have  any history of diabetes.  And since being on Eliquis she has not reported any change in her vision and no falls.   OTHER MEDICAL CONDITIONS: Atrial fibrillation, orthostatic hypotension, GERD, Depression    REVIEW OF SYSTEMS: Full 14 system review of systems performed and negative with exception of: as noted in the HPI   ALLERGIES: Allergies  Allergen Reactions   Tetanus Toxoids     Arm swelling    Penicillins    Sulfacetamide Sodium     HOME MEDICATIONS: Outpatient Medications Prior to Visit  Medication Sig Dispense Refill   apixaban (ELIQUIS) 2.5 MG TABS tablet Take 1 tablet (2.5 mg total) by mouth 2 (two) times daily. 60 tablet 6   atorvastatin (LIPITOR) 20 MG tablet Take 1 tablet (20 mg total) by mouth at bedtime. 90 tablet 0   busPIRone (BUSPAR) 7.5 MG tablet Take 1 tablet (7.5 mg total) by mouth 2 (two) times daily. 60 tablet 1   calcium citrate-vitamin D (CITRACAL+D) 315-200 MG-UNIT tablet Take 1 tablet by mouth daily.     Cyanocobalamin (B-12) 1000 MCG TABS Take by mouth.     fish oil-omega-3 fatty acids 1000 MG capsule Take 2 g by mouth daily.     fluticasone (FLONASE) 50 MCG/ACT nasal spray SHAKE WELL AND USE 2 SPRAYS IN EACH NOSTRIL DAILY 16 g 3   metoprolol succinate (TOPROL-XL) 25 MG 24 hr tablet Take 0.5 tablets (12.5 mg total) by mouth daily. 45 tablet 2  Multiple Vitamins-Minerals (PRESERVISION AREDS 2) CAPS Take 1 capsule by mouth 2 (two) times daily.      zolpidem (AMBIEN) 5 MG tablet Take 1 tablet (5 mg total) by mouth at bedtime as needed. 90 tablet 0   apixaban (ELIQUIS) 2.5 MG TABS tablet Take 1 tablet (2.5 mg total) by mouth 2 (two) times daily. 28 tablet 0   No facility-administered medications prior to visit.    PAST MEDICAL HISTORY: Past Medical History:  Diagnosis Date   Allergy    Arthritis    BCC (basal cell carcinoma of skin)    GERD (gastroesophageal reflux disease)    Glaucoma suspect    HOH (hard of hearing)    Osteoporosis     PAST  SURGICAL HISTORY: Past Surgical History:  Procedure Laterality Date   ABDOMINAL HYSTERECTOMY     still have cervix   ARTERY BIOPSY Right 10/06/2014   Procedure: BIOPSY TEMPORAL ARTERY;  Surgeon: Armandina Gemma, MD;  Location: [facility];  Service: General;  Laterality: Right;   BREAST EXCISIONAL BIOPSY Right    over 20 years ago; lymph nodes removed   CATARACT EXTRACTION  10/2011   Left   colonscopy  2007   INNER EAR SURGERY  1974   both stapes replaced-steal   TUBAL LIGATION      FAMILY HISTORY: Family History  Problem Relation Age of Onset   Heart failure Mother    Hypertension Sister    Asthma Sister    Hypertension Brother     SOCIAL HISTORY: Social History   Socioeconomic History   Marital status: Married    Spouse name: Not on file   Number of children: 2   Years of education: Not on file   Highest education level: Not on file  Occupational History   Occupation: retired , day care  Tobacco Use   Smoking status: Never   Smokeless tobacco: Never  Substance and Sexual Activity   Alcohol use: Yes    Comment: rarely   Drug use: No   Sexual activity: Not on file  Other Topics Concern   Not on file  Social History Narrative   Household: pt and husband       2 g- children   2 g-g- children   Social Determinants of Health   Financial Resource Strain: Not on file  Food Insecurity: Not on file  Transportation Needs: Not on file  Physical Activity: Not on file  Stress: Not on file  Social Connections: Not on file  Intimate Partner Violence: Not on file    PHYSICAL EXAM  GENERAL EXAM/CONSTITUTIONAL: Vitals:  Vitals:   07/27/21 1044  BP: 129/76  Pulse: 73  Weight: 103 lb (46.7 kg)  Height: 5\' 4"  (1.626 m)   Body mass index is 17.68 kg/m. Wt Readings from Last 3 Encounters:  07/27/21 103 lb (46.7 kg)  07/12/21 107 lb 3.2 oz (48.6 kg)  07/08/21 108 lb 12.8 oz (49.4 kg)   Patient is in no distress; well developed, nourished and groomed;  neck is supple  CARDIOVASCULAR: Examination of carotid arteries is normal; no carotid bruits Regular rate and rhythm, no murmurs Examination of peripheral vascular system by observation and palpation is normal  EYES: Pupils round and reactive to light, Visual fields full to confrontation, Extraocular movements intacts,   MUSCULOSKELETAL: Gait, strength, tone, movements noted in Neurologic exam below  NEUROLOGIC: MENTAL STATUS:  No flowsheet data found. awake, alert, oriented to person, place and time recent and remote memory  intact normal attention and concentration language fluent, comprehension intact, naming intact fund of knowledge appropriate  CRANIAL NERVE:  2nd, 3rd, 4th, 6th - pupils equal and reactive to light, visual fields full to confrontation, extraocular muscles intact, no nystagmus 5th - facial sensation symmetric 7th - facial strength symmetric 8th - hearing intact 9th - palate elevates symmetrically, uvula midline 11th - shoulder shrug symmetric 12th - tongue protrusion midline  MOTOR:  normal bulk and tone, full strength in the BUE, BLE  SENSORY:  normal and symmetric to light touch, pinprick, temperature, vibration  COORDINATION:  finger-nose-finger, fine finger movements normal  REFLEXES:  deep tendon reflexes present and symmetric  GAIT/STATION:  normal     DIAGNOSTIC DATA (LABS, IMAGING, TESTING) - I reviewed patient records, labs, notes, testing and imaging myself where available.  Lab Results  Component Value Date   WBC 6.2 07/06/2021   HGB 12.2 07/06/2021   HCT 37.1 07/06/2021   MCV 94.6 07/06/2021   PLT 205 07/06/2021      Component Value Date/Time   NA 136 07/06/2021 1116   NA 139 02/04/2015 0000   K 4.0 07/06/2021 1116   CL 101 07/06/2021 1116   CO2 27 07/06/2021 1116   GLUCOSE 97 07/06/2021 1116   BUN 25 (H) 07/06/2021 1116   BUN 18 02/04/2015 0000   CREATININE 0.73 07/06/2021 1116   CALCIUM 8.6 (L) 07/06/2021 1116    PROT 7.1 11/12/2020 0942   ALBUMIN 3.9 11/12/2020 0942   AST 28 11/12/2020 0942   ALT 20 11/12/2020 0942   ALKPHOS 53 11/12/2020 0942   BILITOT 0.7 11/12/2020 0942   GFRNONAA >60 07/06/2021 1116   GFRAA 107 04/17/2008 1013   Lab Results  Component Value Date   CHOL 225 (H) 12/01/2019   HDL 63.70 12/01/2019   LDLCALC 138 (H) 12/01/2019   LDLDIRECT 143.5 05/31/2012   TRIG 113.0 12/01/2019   CHOLHDL 4 12/01/2019   No results found for: HGBA1C Lab Results  Component Value Date   VITAMINB12 1,431 (H) 10/02/2018   Lab Results  Component Value Date   TSH 1.85 06/23/2021    Head CT 07/06/21 1.  No acute intracranial abnormality. 2. Left occipital scalp hematoma without evidence of calvarial fracture   Head CT 06/27/2021 1. No acute intracranial findings. 2. Mild age-related global parenchymal volume loss and chronic ischemic small vessel white matter disease.   Echocardiogram 07/11/2021 1. Left ventricular ejection fraction, by estimation, is 30 to 35%. The left ventricle has moderately decreased function. The left ventricle has no regional wall motion abnormalities. Left ventricular diastolic parameters are consistent with Grade I diastolic dysfunction (impaired relaxation).  2. Right ventricular systolic function is normal. The right ventricular size is normal.  3. Left atrial size was mildly dilated.  4. The mitral valve is normal in structure. Trivial mitral valve regurgitation. No evidence of mitral stenosis.  5. The aortic valve is normal in structure. Aortic valve regurgitation is not visualized. No aortic stenosis is present.     ASSESSMENT AND PLAN  86 y.o. year old female with past medical history of depression, atrial fibrillation, hyperlipidemia, GERD who is presenting after ED visit for double vision and diagnosed with TIA.  She had follow up with cardiology, had a heart monitor showing evidence of atrial fibrillation, therefore put on Eliquis and Metoprolol.  She  was also found to have orthostatic hypotension.  In terms of her TIA, her risk factor include atrial fibrillation and hyperlipidemia which are well managed with  Eliquis and high-dose statin.  She does not have a history of diabetes or hypertension.  In terms of her syncopal episode, this was likely related to orthostatic hypotension because she did mention that the syncope usually happened after standing, she will feel dizzy and passout.  She did have blood pressure taken while sitting and standing at her cardiology office which showed evidence of orthostatic hypotension.  We recommended to continue following up with her primary care doctor and cardiologist and to return in 1 year or sooner if worse.    1. TIA (transient ischemic attack)   2. Orthostatic hypotension   3. Syncope, unspecified syncope type      Patient Instructions  Continue current medications  Follow up with your primary care physician and your cardiologist  Return if worse   No orders of the defined types were placed in this encounter.   No orders of the defined types were placed in this encounter.   Return if symptoms worsen or fail to improve.    Alric Ran, MD 07/27/2021, 4:37 PM  [facility] [address], [place], [place] [address] [phone]

## 2021-07-27 NOTE — Patient Instructions (Signed)
Continue current medications  Follow up with your primary care physician and your cardiologist  Return if worse

## 2021-08-03 ENCOUNTER — Telehealth: Payer: Self-pay | Admitting: Cardiovascular Disease

## 2021-08-03 DIAGNOSIS — I700 Atherosclerosis of aorta: Secondary | ICD-10-CM

## 2021-08-03 NOTE — Telephone Encounter (Signed)
Patient's daughter called to schedule her mother for an aorta scan, however I didn't see an order for it.  She said the nurse had called her and told Dr. Sallyanne Kuster wanted patient to have one done.

## 2021-08-03 NOTE — Telephone Encounter (Signed)
Spoke with the patient's daughter, per the dpr. She stated that the patient would like to go through with the coronary ct. She was concerned about her mother taking extra Metoprolol to slow her heart rate for the test. She recently started Metoprolol Succinate 12.5 mg once daily and does not keep track of her heart rate since starting it.  She has been educated that if her heart rate does not get to a certain rate then they will not be able to do the test. Her mother and daughter have agreed to move forward with the test. Her daughter stated that she will stay with her for the day of the test.

## 2021-08-05 ENCOUNTER — Encounter: Payer: Self-pay | Admitting: *Deleted

## 2021-08-05 MED ORDER — METOPROLOL TARTRATE 50 MG PO TABS: ORAL_TABLET | ORAL | 0 refills | Status: DC

## 2021-08-05 NOTE — Telephone Encounter (Signed)
Orders have been placed and message sent via MyChart with instructions.   Croitoru, Mihai, MD  You 22 hours ago (10:19 AM)   She needs 50 mg of metoprolol tartrate. Please reassure her that the medication wears off in less than 8 hours.We need her heart rate around 60 for a good test.

## 2021-08-11 ENCOUNTER — Other Ambulatory Visit (HOSPITAL_COMMUNITY): Payer: Self-pay | Admitting: *Deleted

## 2021-08-11 ENCOUNTER — Telehealth (HOSPITAL_COMMUNITY): Payer: Self-pay | Admitting: *Deleted

## 2021-08-11 DIAGNOSIS — I700 Atherosclerosis of aorta: Secondary | ICD-10-CM | POA: Diagnosis not present

## 2021-08-11 NOTE — Telephone Encounter (Signed)
Reaching out to patient to offer assistance regarding upcoming cardiac imaging study; pt verbalizes understanding of appt date/time, parking situation and where to check in, pre-test NPO status and medications ordered, and verified current allergies; name and call back number provided for further questions should they arise  Savannah Clement RN Navigator Cardiac Imaging [facility] [phone] office [phone] cell  Patient to take 50mg  metoprolol tartrate two hours prior to cardiac CT scan. She is aware to obtain blood work prior to scan and to arrive at 2pm for her 2:30pm scan.

## 2021-08-12 LAB — BASIC METABOLIC PANEL
BUN/Creatinine Ratio: 29 — ABNORMAL HIGH (ref 12–28)
BUN: 20 mg/dL (ref 8–27)
CO2: 26 mmol/L (ref 20–29)
Calcium: 9.5 mg/dL (ref 8.7–10.3)
Chloride: 104 mmol/L (ref 96–106)
Creatinine, Ser: 0.69 mg/dL (ref 0.57–1.00)
Glucose: 102 mg/dL — ABNORMAL HIGH (ref 70–99)
Potassium: 4.4 mmol/L (ref 3.5–5.2)
Sodium: 143 mmol/L (ref 134–144)
eGFR: 86 mL/min/{1.73_m2} (ref >59)

## 2021-08-15 ENCOUNTER — Other Ambulatory Visit: Payer: Self-pay

## 2021-08-15 ENCOUNTER — Ambulatory Visit (HOSPITAL_COMMUNITY)
Admission: RE | Admit: 2021-08-15 | Discharge: 2021-08-15 | Disposition: A | Discharge status: Home / Self Care | Payer: Medicare Other | Source: Ambulatory Visit | Attending: Cardiovascular Disease | Admitting: Cardiovascular Disease

## 2021-08-15 ENCOUNTER — Telehealth: Payer: Self-pay | Admitting: Cardiovascular Disease

## 2021-08-15 DIAGNOSIS — I700 Atherosclerosis of aorta: Secondary | ICD-10-CM | POA: Diagnosis not present

## 2021-08-15 MED ORDER — IOHEXOL 350 MG/ML SOLN
100.0000 mL | Freq: Once | INTRAVENOUS | Status: AC | PRN
  Administered 2021-08-15: 100 mL via INTRAVENOUS

## 2021-08-15 MED ORDER — NITROGLYCERIN 0.4 MG SL SUBL
0.8000 mg | SUBLINGUAL_TABLET | Freq: Once | SUBLINGUAL | Status: AC
  Administered 2021-08-15: 0.8 mg via SUBLINGUAL

## 2021-08-15 MED ORDER — SODIUM CHLORIDE 0.9 % IV SOLN: INTRAVENOUS | Status: DC

## 2021-08-15 MED ORDER — NITROGLYCERIN 0.4 MG SL SUBL
SUBLINGUAL_TABLET | SUBLINGUAL | Status: AC
  Filled 2021-08-15: qty 2

## 2021-08-15 NOTE — Telephone Encounter (Signed)
° ° °*  STAT* If patient is at the pharmacy, call can be transferred to refill team.   1. Which medications need to be refilled? (please list name of each medication and dose if known) apixaban (ELIQUIS) 2.5 MG TABS tablet  2. Which pharmacy/location (including street and city if local pharmacy) is medication to be sent to? [facility] ([place], [place]  3. Do they need a 30 day or 90 day supply? 90 days

## 2021-08-16 MED ORDER — APIXABAN 2.5 MG PO TABS: 2.5000 mg | ORAL_TABLET | Freq: Two times a day (BID) | ORAL | 1 refills | Status: DC

## 2021-08-16 NOTE — Telephone Encounter (Signed)
Prescription refill request for Eliquis received. Indication: Aflutter  Last office visit:07/12/21 (Croitoru)  Scr: 0.69 (08/11/21 via [place])  Age: 86 Weight: 46.7kg    Appropriate dose and refill sent to requested pharmacy.

## 2021-08-17 ENCOUNTER — Ambulatory Visit (INDEPENDENT_AMBULATORY_CARE_PROVIDER_SITE_OTHER): Payer: Medicare Other | Admitting: Pulmonary Disease

## 2021-08-17 ENCOUNTER — Encounter: Payer: Self-pay | Admitting: Pulmonary Disease

## 2021-08-17 ENCOUNTER — Other Ambulatory Visit: Payer: Self-pay

## 2021-08-17 VITALS — BP 120/60 | HR 78 | Temp 97.7°F | Ht 63.0 in | Wt 105.8 lb

## 2021-08-17 DIAGNOSIS — Q676 Pectus excavatum: Secondary | ICD-10-CM | POA: Diagnosis not present

## 2021-08-17 DIAGNOSIS — J984 Other disorders of lung: Secondary | ICD-10-CM

## 2021-08-17 DIAGNOSIS — R911 Solitary pulmonary nodule: Secondary | ICD-10-CM

## 2021-08-17 NOTE — Patient Instructions (Addendum)
Thank you for visiting Dr. Valeta Harms at [facility]. Today we recommend the following:  Orders Placed This Encounter  Procedures   CT Chest Wo Contrast   Repeat ct in 9 months   Return in about 9 months (around 05/17/2022).    Please do your part to reduce the spread of COVID-19.

## 2021-08-17 NOTE — Progress Notes (Signed)
Synopsis: Referred in December 2022 for lung nodule by Colon Branch, MD  Subjective:   PATIENT ID: Savannah Jimenez GENDER: female DOB: [birth year], MRN: [id number]  Chief Complaint  Patient presents with   Follow-up    Patient has no complaints. Patient says everything is going good.      This is a 86 year old female, past medical history of basal cell carcinoma of the skin, osteoporosis, arthritis, gastroesophageal reflux disease.  Patient is a never smoker.Patient had a CT scan of the chest on 06/27/2021 in the emergency department.  Patient was found to have a right middle lobe 22 mm nodular opacity concerning for possible underlying malignancy.  Addition could represent a inflammatory or infectious lesion.  Patient also has significant pectus excavatum with compression of the heart and right ventricle.  CT imaging reviewed today in the office.  CT imaging was completed after fall evaluation in the emergency department.  OV 07/08/2021: Here today for follow-up regarding abnormal CT imaging.  She also has complaints of shortness of breath.  This has been slowly progressive as she has gotten older.  Predominantly associated with exertion.  She also has had 2 episodes of passing out this month.  Seen in the ER with concern of TIA also followed up with ophthalmologist.  She has echocardiogram ordered by her primary care provider and has follow-up with PCP Dr. Larose Kells today.  OV 08/17/2021: Here today for follow-up after nuclear medicine pet imaging.  Patient last seen in the office for right middle lobe lobe 22 mm nodular opacity.Patient had a nuclear medicine PET scan on 07/22/2021.  This PET scan showed a mildly reduced size of the nodular reason of concern within the anterior portion of the right lower lobe currently now measuring 1.2 x 0.6 cm and formally was 1.5 x 0.8 cm.  There is only low-level metabolic activity with an SUV max of 1.7.  Reduction in size and change in morphology would suggest  inflammatory or benign etiology.  Patient also has significant pectus excavatum.  Also had recent work-up with cardiology for Holter monitor.   Past Medical History:  Diagnosis Date   Allergy    Arthritis    BCC (basal cell carcinoma of skin)    GERD (gastroesophageal reflux disease)    Glaucoma suspect    HOH (hard of hearing)    Osteoporosis      Family History  Problem Relation Age of Onset   Heart failure Mother    Hypertension Sister    Asthma Sister    Hypertension Brother      Past Surgical History:  Procedure Laterality Date   ABDOMINAL HYSTERECTOMY     still have cervix   ARTERY BIOPSY Right 10/06/2014   Procedure: BIOPSY TEMPORAL ARTERY;  Surgeon: Armandina Gemma, MD;  Location: [facility];  Service: General;  Laterality: Right;   BREAST EXCISIONAL BIOPSY Right    over 20 years ago; lymph nodes removed   CATARACT EXTRACTION  10/2011   Left   colonscopy  2007   INNER EAR SURGERY  1974   both stapes replaced-steal   TUBAL LIGATION      Social History   Socioeconomic History   Marital status: Married    Spouse name: Not on file   Number of children: 2   Years of education: Not on file   Highest education level: Not on file  Occupational History   Occupation: retired , day care  Tobacco Use   Smoking status: Never  Smokeless tobacco: Never  Substance and Sexual Activity   Alcohol use: Yes    Comment: rarely   Drug use: No   Sexual activity: Not on file  Other Topics Concern   Not on file  Social History Narrative   Household: pt and husband       2 g- children   2 g-g- children   Social Determinants of Health   Financial Resource Strain: Not on file  Food Insecurity: Not on file  Transportation Needs: Not on file  Physical Activity: Not on file  Stress: Not on file  Social Connections: Not on file  Intimate Partner Violence: Not on file     Allergies  Allergen Reactions   Tetanus Toxoids     Arm swelling    Penicillins     Sulfacetamide Sodium      Outpatient Medications Prior to Visit  Medication Sig Dispense Refill   apixaban (ELIQUIS) 2.5 MG TABS tablet Take 1 tablet (2.5 mg total) by mouth 2 (two) times daily. 180 tablet 1   atorvastatin (LIPITOR) 20 MG tablet Take 1 tablet (20 mg total) by mouth at bedtime. 90 tablet 0   busPIRone (BUSPAR) 7.5 MG tablet Take 1 tablet (7.5 mg total) by mouth 2 (two) times daily. 60 tablet 1   calcium citrate-vitamin D (CITRACAL+D) 315-200 MG-UNIT tablet Take 1 tablet by mouth daily.     Cyanocobalamin (B-12) 1000 MCG TABS Take by mouth.     fish oil-omega-3 fatty acids 1000 MG capsule Take 2 g by mouth daily.     fluticasone (FLONASE) 50 MCG/ACT nasal spray SHAKE WELL AND USE 2 SPRAYS IN EACH NOSTRIL DAILY 16 g 3   metoprolol succinate (TOPROL-XL) 25 MG 24 hr tablet Take 0.5 tablets (12.5 mg total) by mouth daily. 45 tablet 2   Multiple Vitamins-Minerals (PRESERVISION AREDS 2) CAPS Take 1 capsule by mouth 2 (two) times daily.      zolpidem (AMBIEN) 5 MG tablet Take 1 tablet (5 mg total) by mouth at bedtime as needed. 90 tablet 0   metoprolol tartrate (LOPRESSOR) 50 MG tablet Take one tablet two hours prior to the test 1 tablet 0   No facility-administered medications prior to visit.    Review of Systems  Constitutional:  Negative for chills, fever, malaise/fatigue and weight loss.  HENT:  Negative for hearing loss, sore throat and tinnitus.   Eyes:  Negative for blurred vision and double vision.  Respiratory:  Positive for shortness of breath. Negative for cough, hemoptysis, sputum production, wheezing and stridor.   Cardiovascular:  Negative for chest pain, palpitations, orthopnea, leg swelling and PND.  Gastrointestinal:  Negative for abdominal pain, constipation, diarrhea, heartburn, nausea and vomiting.  Genitourinary:  Negative for dysuria, hematuria and urgency.  Musculoskeletal:  Negative for joint pain and myalgias.  Skin:  Negative for itching and rash.   Neurological:  Negative for dizziness, tingling, weakness and headaches.       Lightheadedness, feeling of syncope  Endo/Heme/Allergies:  Negative for environmental allergies. Does not bruise/bleed easily.  Psychiatric/Behavioral:  Negative for depression. The patient is not nervous/anxious and does not have insomnia.   All other systems reviewed and are negative.   Objective:  Physical Exam Vitals reviewed.  Constitutional:      General: She is not in acute distress.    Appearance: She is well-developed.  HENT:     Head: Normocephalic and atraumatic.  Eyes:     General: No scleral icterus.    Conjunctiva/sclera: Conjunctivae  normal.     Pupils: Pupils are equal, round, and reactive to light.  Neck:     Vascular: No JVD.     Trachea: No tracheal deviation.  Cardiovascular:     Rate and Rhythm: Normal rate and regular rhythm.     Heart sounds: Normal heart sounds. No murmur heard. Pulmonary:     Effort: Pulmonary effort is normal. No tachypnea, accessory muscle usage or respiratory distress.     Breath sounds: No stridor. No wheezing, rhonchi or rales.     Comments: Chest with severe pectus excavatum. Abdominal:     General: There is no distension.     Palpations: Abdomen is soft.     Tenderness: There is no abdominal tenderness.  Musculoskeletal:        General: No tenderness.     Cervical back: Neck supple.  Lymphadenopathy:     Cervical: No cervical adenopathy.  Skin:    General: Skin is warm and dry.     Capillary Refill: Capillary refill takes less than 2 seconds.     Findings: No rash.  Neurological:     Mental Status: She is alert and oriented to person, place, and time.  Psychiatric:        Behavior: Behavior normal.     Vitals:   08/17/21 1108  BP: 120/60  Pulse: 78  Temp: 97.7 F (36.5 C)  TempSrc: Oral  SpO2: 97%  Weight: 105 lb 12.8 oz (48 kg)  Height: 5\' 3"  (1.6 m)   97% on RA BMI Readings from Last 3 Encounters:  08/17/21 18.74 kg/m   07/27/21 17.68 kg/m  07/12/21 18.40 kg/m   Wt Readings from Last 3 Encounters:  08/17/21 105 lb 12.8 oz (48 kg)  07/27/21 103 lb (46.7 kg)  07/12/21 107 lb 3.2 oz (48.6 kg)     CBC    Component Value Date/Time   WBC 6.2 07/06/2021 1116   RBC 3.92 07/06/2021 1116   HGB 12.2 07/06/2021 1116   HCT 37.1 07/06/2021 1116   PLT 205 07/06/2021 1116   MCV 94.6 07/06/2021 1116   MCH 31.1 07/06/2021 1116   MCHC 32.9 07/06/2021 1116   RDW 13.2 07/06/2021 1116   LYMPHSABS 0.8 07/06/2021 1116   MONOABS 0.4 07/06/2021 1116   EOSABS 0.0 07/06/2021 1116   BASOSABS 0.0 07/06/2021 1116     Chest Imaging: 06/27/2021 CT chest: 22 mm irregular shaped nodular opacity adjacent to the pleura of the right middle lobe.  Could represent underlying malignancy versus inflammatory lesion.  More of the associated changes within the area are suggestive of inflammatory lesion. The patient's images have been independently reviewed by me.    Pulmonary Functions Testing Results: No flowsheet data found.  FeNO:   Pathology:   Echocardiogram:   Heart Catheterization:     Assessment & Plan:     ICD-10-CM   1. Nodule of middle lobe of right lung  R91.1     2. Pectus excavatum  Q67.6     3. Restrictive lung disease  J98.4        Discussion:  This is an 86 year old female, initially saw me for a right middle lobe pulmonary nodule, follow-up nuclear medicine pet imaging with low-level PET uptake, SUV max of 1.7 and slight regression of the nodule.  I suspect this is inflammatory in nature. She has severe restrictive thoracic disease. She has a Pharmacologist Index of 5.275 which can be associated with hemodynamic consequences and often referred for reconstruction of the  chest but this is not possible due to the patient's age.  I would not be surprised if some of her presyncopal symptoms could be related.  Plan: Encouraged her to stay well-hydrated to prevent hypovolemia. Follow-up planned already  with cardiology. She needs to have orthostatic precautions while standing or fast movements to stand. Repeat noncontrasted CT chest in 9 months for right middle lobe lung nodule follow-up.    Current Outpatient Medications:    apixaban (ELIQUIS) 2.5 MG TABS tablet, Take 1 tablet (2.5 mg total) by mouth 2 (two) times daily., Disp: 180 tablet, Rfl: 1   atorvastatin (LIPITOR) 20 MG tablet, Take 1 tablet (20 mg total) by mouth at bedtime., Disp: 90 tablet, Rfl: 0   busPIRone (BUSPAR) 7.5 MG tablet, Take 1 tablet (7.5 mg total) by mouth 2 (two) times daily., Disp: 60 tablet, Rfl: 1   calcium citrate-vitamin D (CITRACAL+D) 315-200 MG-UNIT tablet, Take 1 tablet by mouth daily., Disp: , Rfl:    Cyanocobalamin (B-12) 1000 MCG TABS, Take by mouth., Disp: , Rfl:    fish oil-omega-3 fatty acids 1000 MG capsule, Take 2 g by mouth daily., Disp: , Rfl:    fluticasone (FLONASE) 50 MCG/ACT nasal spray, SHAKE WELL AND USE 2 SPRAYS IN EACH NOSTRIL DAILY, Disp: 16 g, Rfl: 3   metoprolol succinate (TOPROL-XL) 25 MG 24 hr tablet, Take 0.5 tablets (12.5 mg total) by mouth daily., Disp: 45 tablet, Rfl: 2   Multiple Vitamins-Minerals (PRESERVISION AREDS 2) CAPS, Take 1 capsule by mouth 2 (two) times daily. , Disp: , Rfl:    zolpidem (AMBIEN) 5 MG tablet, Take 1 tablet (5 mg total) by mouth at bedtime as needed., Disp: 90 tablet, Rfl: 0   Garner Nash, DO [facility] 08/17/2021 11:24 AM

## 2021-08-18 DIAGNOSIS — H532 Diplopia: Secondary | ICD-10-CM | POA: Diagnosis not present

## 2021-08-24 ENCOUNTER — Ambulatory Visit (INDEPENDENT_AMBULATORY_CARE_PROVIDER_SITE_OTHER): Payer: Medicare Other | Admitting: Internal Medicine

## 2021-08-24 ENCOUNTER — Encounter: Payer: Self-pay | Admitting: Internal Medicine

## 2021-08-24 VITALS — BP 124/68 | HR 82 | Temp 98.4°F | Resp 18 | Ht 64.0 in | Wt 105.5 lb

## 2021-08-24 DIAGNOSIS — E7800 Pure hypercholesterolemia, unspecified: Secondary | ICD-10-CM

## 2021-08-24 DIAGNOSIS — F419 Anxiety disorder, unspecified: Secondary | ICD-10-CM

## 2021-08-24 DIAGNOSIS — G459 Transient cerebral ischemic attack, unspecified: Secondary | ICD-10-CM

## 2021-08-24 DIAGNOSIS — I4892 Unspecified atrial flutter: Secondary | ICD-10-CM

## 2021-08-24 DIAGNOSIS — I2510 Atherosclerotic heart disease of native coronary artery without angina pectoris: Secondary | ICD-10-CM | POA: Diagnosis not present

## 2021-08-24 DIAGNOSIS — R55 Syncope and collapse: Secondary | ICD-10-CM

## 2021-08-24 DIAGNOSIS — R911 Solitary pulmonary nodule: Secondary | ICD-10-CM | POA: Diagnosis not present

## 2021-08-24 LAB — LIPID PANEL
Cholesterol: 140 mg/dL (ref 0–200)
HDL: 62.7 mg/dL (ref >39.00)
LDL Cholesterol: 61 mg/dL (ref 0–99)
NonHDL: 77.65
Total CHOL/HDL Ratio: 2
Triglycerides: 85 mg/dL (ref 0.0–149.0)
VLDL: 17 mg/dL (ref 0.0–40.0)

## 2021-08-24 LAB — AST: AST: 24 U/L (ref 0–37)

## 2021-08-24 LAB — ALT: ALT: 17 U/L (ref 0–35)

## 2021-08-24 MED ORDER — BUSPIRONE HCL 7.5 MG PO TABS: 7.5000 mg | ORAL_TABLET | Freq: Two times a day (BID) | ORAL | 1 refills | Status: DC

## 2021-08-24 NOTE — Progress Notes (Signed)
Cardiology Office Note:    Date:  08/25/2021   ID:  Savannah Jimenez, DOB Jan 29, 1936, MRN [id number]  PCP:  Colon Branch, MD [facility] Cardiologist: Quay Burow, MD   Reason for visit: 6-week follow-up  History of Present Illness:    Savannah Jimenez is a 86 y.o. female with a hx of hyperlipidemia.  Referred for syncopal episode thought secondary to orthostatic hypotension, possible TIA.    She saw Dr. Alvester Chou in December 2022.  Her blood pressure was 112/72 sitting and dropped to 76/49 with standing; had associated dizziness.  Heart monitor was placed and showed atrial flutter (~1%) and occasional PVCs (~3%).  2D echo 06/2021 showed EF over-read as ~40%, grade 1 diastolic dysfunction, normal RV, mildly dilated left atria, no significant valve disease.  CTA of the coronaries 07/2021 showed minimal plaque in the LAD, Ramus & RCA; distal LAD is patent and contains a myocardial bridge, no significant stenosis the left circumflex, pectus excavatum noted.  Today, she explains that she was put on Paxil for anxiety and depression.  Then she experienced 2 syncopal episodes.  The second, they believe she had a TIA she had vision changes and a quadrant of her eye.  She was then changed to BuSpar.  She feels like since this change her fatigue is better.  She has occasional palpitations.  She feels like for 4-5 beats she will feel a pain sensation.  This is sporadic and can occur every 2 to 3 days.  She denies chest pain.  She has dyspnea on moderate exertion.  She denies PND, orthopnea and lower extremity edema.  She saw a pulmonologist who diagnosed her with substance pectus excavatum.  He told her that this can be more prominent as she age and with lower body mass.  This likely contributes to why she has dyspnea on moderate exertion.  Before she is doing regular walking program she is slowly building back up.    She has had no bleeding issues with Eliquis.     Past Medical History:  Diagnosis  Date   Allergy    Arthritis    BCC (basal cell carcinoma of skin)    GERD (gastroesophageal reflux disease)    Glaucoma suspect    HOH (hard of hearing)    Osteoporosis     Past Surgical History:  Procedure Laterality Date   ABDOMINAL HYSTERECTOMY     still have cervix   ARTERY BIOPSY Right 10/06/2014   Procedure: BIOPSY TEMPORAL ARTERY;  Surgeon: Armandina Gemma, MD;  Location: [facility];  Service: General;  Laterality: Right;   BREAST EXCISIONAL BIOPSY Right    over 20 years ago; lymph nodes removed   CATARACT EXTRACTION  10/2011   Left   colonscopy  2007   INNER EAR SURGERY  1974   both stapes replaced-steal   TUBAL LIGATION      Current Medications: Current Meds  Medication Sig   apixaban (ELIQUIS) 2.5 MG TABS tablet Take 1 tablet (2.5 mg total) by mouth 2 (two) times daily.   atorvastatin (LIPITOR) 20 MG tablet Take 1 tablet (20 mg total) by mouth at bedtime.   busPIRone (BUSPAR) 7.5 MG tablet Take 1 tablet (7.5 mg total) by mouth 2 (two) times daily.   calcium citrate-vitamin D (CITRACAL+D) 315-200 MG-UNIT tablet Take 1 tablet by mouth daily.   Cyanocobalamin (B-12) 1000 MCG TABS Take by mouth.   fish oil-omega-3 fatty acids 1000 MG capsule Take 2 g by mouth  daily.   fluticasone (FLONASE) 50 MCG/ACT nasal spray SHAKE WELL AND USE 2 SPRAYS IN EACH NOSTRIL DAILY   metoprolol succinate (TOPROL-XL) 25 MG 24 hr tablet Take 0.5 tablets (12.5 mg total) by mouth daily.   Multiple Vitamins-Minerals (PRESERVISION AREDS 2) CAPS Take 1 capsule by mouth 2 (two) times daily.    zolpidem (AMBIEN) 5 MG tablet Take 1 tablet (5 mg total) by mouth at bedtime as needed.     Allergies:   Tetanus toxoids, Penicillins, and Sulfacetamide sodium   Social History   Socioeconomic History   Marital status: Married    Spouse name: Not on file   Number of children: 2   Years of education: Not on file   Highest education level: Not on file  Occupational History   Occupation:  retired , day care  Tobacco Use   Smoking status: Never   Smokeless tobacco: Never  Substance and Sexual Activity   Alcohol use: Yes    Comment: rarely   Drug use: No   Sexual activity: Not on file  Other Topics Concern   Not on file  Social History Narrative   Household: pt and husband       2 g- children   2 g-g- children   Social Determinants of Health   Financial Resource Strain: Not on file  Food Insecurity: Not on file  Transportation Needs: Not on file  Physical Activity: Not on file  Stress: Not on file  Social Connections: Not on file     Family History: The patient's family history includes Asthma in her sister; Heart failure in her mother; Hypertension in her brother and sister.  ROS:   Please see the history of present illness.     EKGs/Labs/Other Studies Reviewed:    Recent Labs: 06/23/2021: TSH 1.85 07/06/2021: Hemoglobin 12.2; Platelets 205 08/11/2021: BUN 20; Creatinine, Ser 0.69; Potassium 4.4; Sodium 143 08/24/2021: ALT 17   Recent Lipid Panel Lab Results  Component Value Date/Time   CHOL 140 08/24/2021 11:49 AM   TRIG 85.0 08/24/2021 11:49 AM   HDL 62.70 08/24/2021 11:49 AM   LDLCALC 61 08/24/2021 11:49 AM   LDLDIRECT 143.5 05/31/2012 10:16 AM    Physical Exam:    VS:  BP 120/78 (BP Location: Left Arm, Patient Position: Sitting, Cuff Size: Normal)    Pulse 89    Resp 20    Ht 5\' 3"  (1.6 m)    Wt 104 lb 9.6 oz (47.4 kg)    SpO2 97%    BMI 18.53 kg/m    No data found.  Wt Readings from Last 3 Encounters:  08/25/21 104 lb 9.6 oz (47.4 kg)  08/24/21 105 lb 8 oz (47.9 kg)  08/17/21 105 lb 12.8 oz (48 kg)     GEN:  thin elderly female, no acute distress HEENT: Normal NECK: No JVD; No carotid bruits CARDIAC: RRR with ectopics, no murmurs, rubs, gallops; pectus excavatum RESPIRATORY:  Clear to auscultation without rales, wheezing or rhonchi  ABDOMEN: Soft, non-tender, non-distended MUSCULOSKELETAL: No edema; No deformity  SKIN: Warm and  dry NEUROLOGIC:  Alert and oriented PSYCHIATRIC:  Normal affect     ASSESSMENT AND PLAN   Syncope  Orthostatic hypotension -without recurrence since medication change to Buspar -Likely secondary to orthostatic hypotension -Carotid duplex 06/2021 showed normal vertebral flow; no significant carotid disease. -Reviewed echo - EF likely 50 to 55%, no significant valve disease -Given nonpharmacologic instructions to prevent orthostatic hypotension.  Recommend compression stockings.   -Safe to  resume her walking program.  Paroxysmal atrial flutter -Less than 1% of the time per 2-week ZIO monitor. -Continue metoprolol for rate control -Continue Eliquis for stroke prevention; lower dose given age and weight.  CAD with no angina -CTA of the coronaries 07/2021 showed minimal plaque in the LAD, Ramus & RCA; distal LAD is patent and contains a myocardial bridge, no significant stenosis the left circumflex, pectus excavatum noted. -Continue Lipitor.  No aspirin given need for anticoagulation.  Hyperlipidemia -Continue Lipitor. -Discussed cholesterol lowering diets - Mediterranean diet, DASH diet, vegetarian diet, low-carbohydrate diet and avoidance of trans fats.  Discussed healthier choice substitutes.  Nuts, high-fiber foods, and fiber supplements may also improve lipids.    Disposition - Follow-up in 3 months with Dr. Gwenlyn Found.  Medication Adjustments/Labs and Tests Ordered: Current medicines are reviewed at length with the patient today.  Concerns regarding medicines are outlined above.  No orders of the defined types were placed in this encounter.  No orders of the defined types were placed in this encounter.   Patient Instructions  Medication Instructions:  No Changes *If you need a refill on your cardiac medications before your next appointment, please call your pharmacy*   Lab Work: No Labs If you have labs (blood work) drawn today and your tests are completely normal, you will  receive your results only by: [place] (if you have MyChart) OR A paper copy in the mail If you have any lab test that is abnormal or we need to change your treatment, we will call you to review the results.   Testing/Procedures: No Testing   Follow-Up: At [facility], you and your health needs are our priority.  As part of our continuing mission to provide you with exceptional heart care, we have created designated Provider Care Teams.  These Care Teams include your primary Cardiologist (physician) and Advanced Practice Providers (APPs -  Physician Assistants and Nurse Practitioners) who all work together to provide you with the care you need, when you need it.  We recommend signing up for the patient portal called "MyChart".  Sign up information is provided on this After Visit Summary.  MyChart is used to connect with patients for Virtual Visits (Telemedicine).  Patients are able to view lab/test results, encounter notes, upcoming appointments, etc.  Non-urgent messages can be sent to your provider as well.   To learn more about what you can do with MyChart, go to [email or web address].    Your next appointment:   3 month(s)  The format for your next appointment:   In Person  Provider:   Quay Burow, MD         Signed, Warren Lacy, PA-C  08/25/2021 10:58 AM    [facility]

## 2021-08-24 NOTE — Progress Notes (Signed)
Subjective:    Patient ID: Savannah Jimenez, female    DOB: 1935/07/30, 86 y.o.   MRN: [id number]  DOS:  08/24/2021 Type of visit - description: Follow-up, here with her daughter  Here to follow-up on multiple issues. Since her last visit, saw multiple doctors and had multiple tests, extensive chart review. She actually feels well. On BuSpar, anxiety well controlled. Syncope: No further episodes. Had a TIA: Diplopia improving.   Review of Systems  No nausea or vomiting.  No blood in the stool  Past Medical History:  Diagnosis Date   Allergy    Arthritis    BCC (basal cell carcinoma of skin)    GERD (gastroesophageal reflux disease)    Glaucoma suspect    HOH (hard of hearing)    Osteoporosis     Past Surgical History:  Procedure Laterality Date   ABDOMINAL HYSTERECTOMY     still have cervix   ARTERY BIOPSY Right 10/06/2014   Procedure: BIOPSY TEMPORAL ARTERY;  Surgeon: Armandina Gemma, MD;  Location: [facility];  Service: General;  Laterality: Right;   BREAST EXCISIONAL BIOPSY Right    over 20 years ago; lymph nodes removed   CATARACT EXTRACTION  10/2011   Left   colonscopy  2007   INNER EAR SURGERY  1974   both stapes replaced-steal   TUBAL LIGATION      Current Outpatient Medications  Medication Instructions   apixaban (ELIQUIS) 2.5 mg, Oral, 2 times daily   atorvastatin (LIPITOR) 20 mg, Oral, Daily at bedtime   busPIRone (BUSPAR) 7.5 mg, Oral, 2 times daily   calcium citrate-vitamin D (CITRACAL+D) 315-200 MG-UNIT tablet 1 tablet, Oral, Daily   Cyanocobalamin (B-12) 1000 MCG TABS Oral   fish oil-omega-3 fatty acids 2 g, Oral, Daily,     fluticasone (FLONASE) 50 MCG/ACT nasal spray SHAKE WELL AND USE 2 SPRAYS IN EACH NOSTRIL DAILY   metoprolol succinate (TOPROL-XL) 12.5 mg, Oral, Daily   Multiple Vitamins-Minerals (PRESERVISION AREDS 2) CAPS 1 capsule, Oral, 2 times daily   zolpidem (AMBIEN) 5 mg, Oral, At bedtime PRN       Objective:   Physical  Exam BP 124/68 (BP Location: Left Arm, Patient Position: Sitting, Cuff Size: Small)    Pulse 82    Temp 98.4 F (36.9 C) (Oral)    Resp 18    Ht 5\' 4"  (1.626 m)    Wt 105 lb 8 oz (47.9 kg)    SpO2 98%    BMI 18.11 kg/m  General:   Well developed, NAD, BMI noted. HEENT:  Normocephalic . Face symmetric, atraumatic Lungs:  CTA B Normal respiratory effort, no intercostal retractions, no accessory muscle use. Heart: RRR,  no murmur.  Lower extremities: no pretibial edema bilaterally  Skin: Not pale. Not jaundice Neurologic:  alert & oriented X3.  Speech normal, gait appropriate for age and unassisted Psych--  Cognition and judgment appear intact.  Cooperative with normal attention span and concentration.  Behavior appropriate. No anxious or depressed appearing.      Assessment    Assessment, transferring from Dr. Lianne Cure (01/2018)  Allergies GERD Osteoporosis: Had actonel before  -Tscore  -2.2 (10-2017) Insomnia H/o polymyalgia rheumatica; used to see Dr Charlestine Night, [facility] Rota. Bx 2016 Palpable abdominal aorta: Abdominal ultrasound  09/2018 negative for AAA B12 deficiency Ear implants: No MRI  PLAN Here w/ her daughter  Atrial flutter. Due to syncope, saw cardiology 07/12/2021, they noted the syncope happened a  when standing up, they noted severe  BP drop by standing. Subsequently, long-term monitor showed atrial flutter and started Eliquis 07/22/2021 Also had an echo, this was reviewed by cardiology, after reviewing the films they felt that the EF is 50% CT coronary 08/15/2021: No serious blockages.  + Plaque Current management includes Eliquis, metoprolol. Syncope: See above, no further episodes. Lung nodule: Saw pulmonary 08/17/2021, they noted low level PET uptake.  They are planning to repeat the CT  chest in 9 months TIA Saw neurology 07/27/2021, they felt that TIA was well managed with anticoagulation and cholesterol medication. They felt that syncope was probably orthostatic.   Next follow-up with neurology 1 year Unable to pursue brain MRI due to ear implants High cholesterol: Started atorvastatin 20 mg approximately 2 months ago, not fasting, check a lipid panel, AST ALT Anxiety: At the last visit, Paxil was switched to buspirone, good compliance, good tolerance, feels well emotionally.  RF sent. Patient education multiple questions answered for the daughter and pt, such as difference between aspirin and Eliquis. Benefits>risks RTC 4 to 5 months     This visit occurred during the SARS-CoV-2 public health emergency.  Safety protocols were in place, including screening questions prior to the visit, additional usage of staff PPE, and extensive cleaning of exam room while observing appropriate contact time as indicated for disinfecting solutions.

## 2021-08-24 NOTE — Assessment & Plan Note (Signed)
°  Here w/ her daughter  Atrial flutter. Due to syncope, saw cardiology 07/12/2021, they noted the syncope happened a  when standing up, they noted severe BP drop by standing. Subsequently, long-term monitor showed atrial flutter and started Eliquis 07/22/2021 Also had an echo, this was reviewed by cardiology, after reviewing the films they felt that the EF is 50% CT coronary 08/15/2021: No serious blockages.  + Plaque Current management includes Eliquis, metoprolol. Syncope: See above, no further episodes. Lung nodule: Saw pulmonary 08/17/2021, they noted low level PET uptake.  They are planning to repeat the CT  chest in 9 months TIA Saw neurology 07/27/2021, they felt that TIA was well managed with anticoagulation and cholesterol medication. They felt that syncope was probably orthostatic.  Next follow-up with neurology 1 year Unable to pursue brain MRI due to ear implants High cholesterol: Started atorvastatin 20 mg approximately 2 months ago, not fasting, check a lipid panel, AST ALT Anxiety: At the last visit, Paxil was switched to buspirone, good compliance, good tolerance, feels well emotionally.  RF sent. Patient education multiple questions answered for the daughter and pt, such as difference between aspirin and Eliquis. Benefits>risks RTC 4 to 5 months

## 2021-08-24 NOTE — Patient Instructions (Signed)
Check the  blood pressure regularly BP GOAL is between 110/65 and  135/85. If it is consistently higher or lower, let me know    GO TO THE LAB : Get the blood work     [place], [place] back for   checkup in 4 to 5 months

## 2021-08-25 ENCOUNTER — Other Ambulatory Visit: Payer: Self-pay

## 2021-08-25 ENCOUNTER — Ambulatory Visit (INDEPENDENT_AMBULATORY_CARE_PROVIDER_SITE_OTHER): Payer: Medicare Other | Admitting: Physician Assistant

## 2021-08-25 ENCOUNTER — Encounter: Payer: Self-pay | Admitting: Physician Assistant

## 2021-08-25 VITALS — BP 120/78 | HR 89 | Resp 20 | Ht 63.0 in | Wt 104.6 lb

## 2021-08-25 DIAGNOSIS — I2510 Atherosclerotic heart disease of native coronary artery without angina pectoris: Secondary | ICD-10-CM | POA: Diagnosis not present

## 2021-08-25 DIAGNOSIS — E7800 Pure hypercholesterolemia, unspecified: Secondary | ICD-10-CM

## 2021-08-25 DIAGNOSIS — I951 Orthostatic hypotension: Secondary | ICD-10-CM | POA: Diagnosis not present

## 2021-08-25 DIAGNOSIS — R55 Syncope and collapse: Secondary | ICD-10-CM

## 2021-08-25 DIAGNOSIS — I4892 Unspecified atrial flutter: Secondary | ICD-10-CM | POA: Diagnosis not present

## 2021-08-25 MED ORDER — ATORVASTATIN CALCIUM 20 MG PO TABS: 20.0000 mg | ORAL_TABLET | Freq: Every day | ORAL | 3 refills | Status: DC

## 2021-08-25 NOTE — Addendum Note (Signed)
Addended byDamita Dunnings D on: 08/25/2021 08:02 AM   Modules accepted: Orders

## 2021-08-25 NOTE — Patient Instructions (Signed)
Medication Instructions:  No Changes *If you need a refill on your cardiac medications before your next appointment, please call your pharmacy*   Lab Work: No Labs If you have labs (blood work) drawn today and your tests are completely normal, you will receive your results only by: [place] (if you have MyChart) OR A paper copy in the mail If you have any lab test that is abnormal or we need to change your treatment, we will call you to review the results.   Testing/Procedures: No Testing   Follow-Up: At [facility], you and your health needs are our priority.  As part of our continuing mission to provide you with exceptional heart care, we have created designated Provider Care Teams.  These Care Teams include your primary Cardiologist (physician) and Advanced Practice Providers (APPs -  Physician Assistants and Nurse Practitioners) who all work together to provide you with the care you need, when you need it.  We recommend signing up for the patient portal called "MyChart".  Sign up information is provided on this After Visit Summary.  MyChart is used to connect with patients for Virtual Visits (Telemedicine).  Patients are able to view lab/test results, encounter notes, upcoming appointments, etc.  Non-urgent messages can be sent to your provider as well.   To learn more about what you can do with MyChart, go to [email or web address].    Your next appointment:   3 month(s)  The format for your next appointment:   In Person  Provider:   Quay Burow, MD

## 2021-09-09 ENCOUNTER — Telehealth: Payer: Self-pay | Admitting: Internal Medicine

## 2021-09-09 NOTE — Telephone Encounter (Signed)
Requesting: Ambien 5mg   Contract: 06/16/2020 UDS: None Last Visit: 08/24/2021 Next Visit: 01/30/2022 Last Refill: 06/15/2021 #90 and 0RF  Please Advise

## 2021-09-09 NOTE — Telephone Encounter (Signed)
PDMP okay, Rx sent 

## 2021-10-03 ENCOUNTER — Telehealth: Payer: Self-pay | Admitting: Cardiovascular Disease

## 2021-10-03 MED ORDER — METOPROLOL SUCCINATE ER 25 MG PO TB24
12.5000 mg | ORAL_TABLET | Freq: Every day | ORAL | 2 refills | Status: DC
Start: 2021-10-03 — End: 2022-05-16

## 2021-10-03 NOTE — Telephone Encounter (Signed)
?*  STAT* If patient is at the pharmacy, call can be transferred to refill team. ? ? ?1. Which medications need to be refilled? (please list name of each medication and dose if known)  ?metoprolol succinate (TOPROL-XL) 25 MG 24 hr tablet ? ?2. Which pharmacy/location (including street and city if local pharmacy) is medication to be sent to? [facility] ([facility] ) - [place], [place] ? ?3. Do they need a 30 day or 90 day supply? 90 day supply  ?

## 2021-10-03 NOTE — Telephone Encounter (Signed)
Refills has been sent to the pharmacy. 

## 2021-10-17 DIAGNOSIS — H04123 Dry eye syndrome of bilateral lacrimal glands: Secondary | ICD-10-CM | POA: Diagnosis not present

## 2021-10-17 DIAGNOSIS — H532 Diplopia: Secondary | ICD-10-CM | POA: Diagnosis not present

## 2021-11-22 ENCOUNTER — Ambulatory Visit (INDEPENDENT_AMBULATORY_CARE_PROVIDER_SITE_OTHER): Payer: Medicare Other | Admitting: Cardiovascular Disease

## 2021-11-22 ENCOUNTER — Encounter: Payer: Self-pay | Admitting: Cardiovascular Disease

## 2021-11-22 DIAGNOSIS — I4892 Unspecified atrial flutter: Secondary | ICD-10-CM | POA: Diagnosis not present

## 2021-11-22 DIAGNOSIS — E785 Hyperlipidemia, unspecified: Secondary | ICD-10-CM | POA: Insufficient documentation

## 2021-11-22 DIAGNOSIS — E782 Mixed hyperlipidemia: Secondary | ICD-10-CM | POA: Diagnosis not present

## 2021-11-22 DIAGNOSIS — I951 Orthostatic hypotension: Secondary | ICD-10-CM | POA: Diagnosis not present

## 2021-11-22 DIAGNOSIS — I2510 Atherosclerotic heart disease of native coronary artery without angina pectoris: Secondary | ICD-10-CM

## 2021-11-22 NOTE — Assessment & Plan Note (Signed)
Less than 1% frequency on 2-week Zio patch currently on Eliquis oral anticoagulation for stroke prophylaxis. ?

## 2021-11-22 NOTE — Assessment & Plan Note (Signed)
History of orthostatic hypotension better after discontinuing Paxil.  She has been instructed to liberalize fluid intake as well as salt and change positions slowly.  This is being less of a problem since seeing Caron Presume. ?

## 2021-11-22 NOTE — Patient Instructions (Signed)

## 2021-11-22 NOTE — Progress Notes (Signed)
? ? ? ?11/22/2021 ?Savannah Jimenez   ?[birth year]  ?[id number] ? ?Primary Physician Colon Branch, MD ?Primary Cardiologist: Lorretta Harp MD Lupe Carney, [place] ? ?HPI:  Savannah Jimenez is a 86 y.o. thin-appearing married Caucasian female mother of 2, grandmother of 2 grandchildren who requested singly because she was a friend of Niel Hummer, a friend and former patient of mine.  She has seen Dr. Sallyanne Kuster back in December and Caron Presume 08/25/2021.  The original referral was because of orthostatic hypotension which is improved since discontinuing Paxil.  She is retired from working in [employer].  Her only cardiovascular risk factor are treated hyperlipidemia.  She does have a PMR which has been quiescent.  There is no family history.  She has had a TIA in the past.  Since stopping Paxil her orthostatic hypotension symptoms have improved.  She did have a 2-week Zio patch which showed a less than 1% frequency of paroxysmal atrial flutter currently on Eliquis.  She had a coronary CTA that showed minimal plaquing in the LAD, ramus and RCA.  Her hyperlipidemia is under good control on statin therapy. ? ? ?Current Meds  ?Medication Sig  ? apixaban (ELIQUIS) 2.5 MG TABS tablet Take 1 tablet (2.5 mg total) by mouth 2 (two) times daily.  ? atorvastatin (LIPITOR) 20 MG tablet Take 1 tablet (20 mg total) by mouth at bedtime.  ? busPIRone (BUSPAR) 7.5 MG tablet Take 1 tablet (7.5 mg total) by mouth 2 (two) times daily.  ? calcium citrate-vitamin D (CITRACAL+D) 315-200 MG-UNIT tablet Take 1 tablet by mouth daily.  ? Cyanocobalamin (B-12) 1000 MCG TABS Take by mouth.  ? fish oil-omega-3 fatty acids 1000 MG capsule Take 2 g by mouth daily.  ? fluticasone (FLONASE) 50 MCG/ACT nasal spray SHAKE WELL AND USE 2 SPRAYS IN EACH NOSTRIL DAILY  ? metoprolol succinate (TOPROL-XL) 25 MG 24 hr tablet Take 0.5 tablets (12.5 mg total) by mouth daily.  ? Multiple Vitamins-Minerals (PRESERVISION AREDS 2) CAPS Take 1 capsule by mouth 2 (two) times  daily.   ? zolpidem (AMBIEN) 5 MG tablet TAKE 1 TABLET BY MOUTH AT  BEDTIME AS NEEDED  ?  ? ?Allergies  ?Allergen Reactions  ? Tetanus Toxoids   ?  Arm swelling   ? Penicillins   ? Sulfacetamide Sodium   ? ? ?Social History  ? ?Socioeconomic History  ? Marital status: Married  ?  Spouse name: Not on file  ? Number of children: 2  ? Years of education: Not on file  ? Highest education level: Not on file  ?Occupational History  ? Occupation: retired , day care  ?Tobacco Use  ? Smoking status: Never  ? Smokeless tobacco: Never  ?Substance and Sexual Activity  ? Alcohol use: Yes  ?  Comment: rarely  ? Drug use: No  ? Sexual activity: Not on file  ?Other Topics Concern  ? Not on file  ?Social History Narrative  ? Household: pt and husband   ?   ? 2 g- children  ? 2 g-g- children  ? ?Social Determinants of Health  ? ?Financial Resource Strain: Not on file  ?Food Insecurity: Not on file  ?Transportation Needs: Not on file  ?Physical Activity: Not on file  ?Stress: Not on file  ?Social Connections: Not on file  ?Intimate Partner Violence: Not on file  ?  ? ?Review of Systems: ?General: negative for chills, fever, night sweats or weight changes.  ?Cardiovascular: negative for chest pain,  dyspnea on exertion, edema, orthopnea, palpitations, paroxysmal nocturnal dyspnea or shortness of breath ?Dermatological: negative for rash ?Respiratory: negative for cough or wheezing ?Urologic: negative for hematuria ?Abdominal: negative for nausea, vomiting, diarrhea, bright red blood per rectum, melena, or hematemesis ?Neurologic: negative for visual changes, syncope, or dizziness ?All other systems reviewed and are otherwise negative except as noted above. ? ? ? ?Blood pressure 134/70, pulse 76, height '5\' 3"'$  (1.6 m), weight 108 lb (49 kg), SpO2 96 %.  ?General appearance: alert and no distress ?Neck: no adenopathy, no carotid bruit, no JVD, supple, symmetrical, trachea midline, and thyroid not enlarged, symmetric, no  tenderness/mass/nodules ?Lungs: clear to auscultation bilaterally ?Heart: regular rate and rhythm, S1, S2 normal, no murmur, click, rub or gallop ?Extremities: extremities normal, atraumatic, no cyanosis or edema ?Pulses: 2+ and symmetric ?Skin: Skin color, texture, turgor normal. No rashes or lesions ?Neurologic: Grossly normal ? ?EKG sinus rhythm at 76 with septal Q waves left axis deviation.  I personally reviewed this EKG. ? ?ASSESSMENT AND PLAN:  ? ?Paroxysmal atrial flutter ([place]) ?Less than 1% frequency on 2-week Zio patch currently on Eliquis oral anticoagulation for stroke prophylaxis. ? ?Hyperlipidemia ?History of hyperlipidemia on statin therapy with lipid profile performed 08/24/2021 revealing total cholesterol 140, LDL 61 and HDL of 62. ? ?Orthostatic hypotension ?History of orthostatic hypotension better after discontinuing Paxil.  She has been instructed to liberalize fluid intake as well as salt and change positions slowly.  This is being less of a problem since seeing Caron Presume. ? ?Coronary artery disease ?History of nonobstructive CAD by coronary CTA 07/2021 with minimal plaque in the LAD, ramus and RCA with the possibility of a myocardial bridge.  The patient really denies chest pain. ? ? ? ? ?Lorretta Harp MD FACP,FACC,FAHA, FSCAI ?11/22/2021 ?10:39 AM ?

## 2021-11-22 NOTE — Assessment & Plan Note (Signed)
History of hyperlipidemia on statin therapy with lipid profile performed 08/24/2021 revealing total cholesterol 140, LDL 61 and HDL of 62. ?

## 2021-11-22 NOTE — Assessment & Plan Note (Signed)
History of nonobstructive CAD by coronary CTA 07/2021 with minimal plaque in the LAD, ramus and RCA with the possibility of a myocardial bridge.  The patient really denies chest pain. ?

## 2021-11-25 ENCOUNTER — Other Ambulatory Visit (HOSPITAL_BASED_OUTPATIENT_CLINIC_OR_DEPARTMENT_OTHER): Payer: Self-pay | Admitting: Internal Medicine

## 2021-11-25 DIAGNOSIS — Z1231 Encounter for screening mammogram for malignant neoplasm of breast: Secondary | ICD-10-CM

## 2021-12-08 ENCOUNTER — Encounter (INDEPENDENT_AMBULATORY_CARE_PROVIDER_SITE_OTHER): Payer: Medicare Other | Admitting: Ophthalmology

## 2021-12-08 DIAGNOSIS — H35343 Macular cyst, hole, or pseudohole, bilateral: Secondary | ICD-10-CM | POA: Diagnosis not present

## 2021-12-08 DIAGNOSIS — H43813 Vitreous degeneration, bilateral: Secondary | ICD-10-CM | POA: Diagnosis not present

## 2021-12-08 DIAGNOSIS — H43822 Vitreomacular adhesion, left eye: Secondary | ICD-10-CM | POA: Diagnosis not present

## 2021-12-13 ENCOUNTER — Ambulatory Visit (INDEPENDENT_AMBULATORY_CARE_PROVIDER_SITE_OTHER): Payer: Medicare Other

## 2021-12-13 DIAGNOSIS — Z0000 Encounter for general adult medical examination without abnormal findings: Secondary | ICD-10-CM | POA: Diagnosis not present

## 2021-12-13 DIAGNOSIS — Z780 Asymptomatic menopausal state: Secondary | ICD-10-CM | POA: Diagnosis not present

## 2021-12-13 NOTE — Progress Notes (Addendum)
Subjective:   Savannah Jimenez is a 86 y.o. female who presents for Medicare Annual (Subsequent) preventive examination.  I connected with  Savannah Jimenez on 12/13/21 by a audio enabled telemedicine application and verified that I am speaking with the correct person using two identifiers.  Patient Location: Home  Provider Location: Office/Clinic  I discussed the limitations of evaluation and management by telemedicine. The patient expressed understanding and agreed to proceed.   Review of Systems     Cardiac Risk Factors include: advanced age (>65mn, >>62women);hypertension     Objective:    There were no vitals filed for this visit. There is no height or weight on file to calculate BMI.     12/13/2021   10:25 AM 07/06/2021   11:00 AM 11/12/2020    9:32 AM 07/08/2020   10:26 AM 07/07/2019   10:05 AM 07/04/2018    9:09 AM 10/06/2014    9:59 AM  Advanced Directives  Does Patient Have a Medical Advance Directive? Yes Yes No Yes Yes Yes Yes  Type of [place]amed[place] of facility DNR (pink MOST or yellow form);Living will [place] will  [place] Hills[place] will   Does patient want to make changes to medical advance directive? No - Patient declined No - Patient declined   No - Patient declined    Copy of [place] Chart? Yes - validated most recent copy scanned in chart (See row information) No - copy requested  No - copy requested Yes - validated most recent copy scanned in chart (See row information) Yes - validated most recent copy scanned in chart (See row information) No - copy requested  Would patient like information on creating a medical advance directive?  No - Patient declined         Current Medications (verified) Outpatient Encounter Medications as of 12/13/2021  Medication Sig   apixaban  (ELIQUIS) 2.5 MG TABS tablet Take 1 tablet (2.5 mg total) by mouth 2 (two) times daily.   atorvastatin (LIPITOR) 20 MG tablet Take 1 tablet (20 mg total) by mouth at bedtime.   busPIRone (BUSPAR) 7.5 MG tablet Take 1 tablet (7.5 mg total) by mouth 2 (two) times daily.   calcium citrate-vitamin D (CITRACAL+D) 315-200 MG-UNIT tablet Take 1 tablet by mouth daily.   Cyanocobalamin (B-12) 1000 MCG TABS Take by mouth.   fish oil-omega-3 fatty acids 1000 MG capsule Take 2 g by mouth daily.   fluticasone (FLONASE) 50 MCG/ACT nasal spray SHAKE WELL AND USE 2 SPRAYS IN EACH NOSTRIL DAILY   metoprolol succinate (TOPROL-XL) 25 MG 24 hr tablet Take 0.5 tablets (12.5 mg total) by mouth daily.   Multiple Vitamins-Minerals (PRESERVISION AREDS 2) CAPS Take 1 capsule by mouth 2 (two) times daily.    zolpidem (AMBIEN) 5 MG tablet TAKE 1 TABLET BY MOUTH AT  BEDTIME AS NEEDED   No facility-administered encounter medications on file as of 12/13/2021.    Allergies (verified) Tetanus toxoids, Penicillins, and Sulfacetamide sodium   History: Past Medical History:  Diagnosis Date   Allergy    Arthritis    BCC (basal cell carcinoma of skin)    GERD (gastroesophageal reflux disease)    Glaucoma suspect    HOH (hard of hearing)    Osteoporosis    Past Surgical History:  Procedure Laterality Date   ABDOMINAL HYSTERECTOMY     still  have cervix   ARTERY BIOPSY Right 10/06/2014   Procedure: BIOPSY TEMPORAL ARTERY;  Surgeon: Armandina Gemma, MD;  Location: [place];  Service: General;  Laterality: Right;   BREAST EXCISIONAL BIOPSY Right    over 20 years ago; lymph nodes removed   CATARACT EXTRACTION  10/2011   Left   colonscopy  2007   INNER EAR SURGERY  1974   both stapes replaced-steal   TUBAL LIGATION     Family History  Problem Relation Age of Onset   Heart failure Mother    Hypertension Sister    Asthma Sister    Hypertension Brother    Social History   Socioeconomic History    Marital status: Married    Spouse name: Not on file   Number of children: 2   Years of education: Not on file   Highest education level: Not on file  Occupational History   Occupation: retired , day care  Tobacco Use   Smoking status: Never   Smokeless tobacco: Never  Substance and Sexual Activity   Alcohol use: Yes    Comment: rarely   Drug use: No   Sexual activity: Not on file  Other Topics Concern   Not on file  Social History Narrative   Household: pt and husband       2 g- children   2 g-g- children   Social Determinants of Health   Financial Resource Strain: Low Risk    Difficulty of Paying Living Expenses: Not hard at all  Food Insecurity: No Food Insecurity   Worried About Charity fundraiser in the Last Year: Never true   [place] in the Last Year: Never true  Transportation Needs: No Transportation Needs   Lack of Transportation (Medical): No   Lack of Transportation (Non-Medical): No  Physical Activity: Insufficiently Active   Days of Exercise per Week: 1 day   Minutes of Exercise per Session: 20 min  Stress: No Stress Concern Present   Feeling of Stress : Not at all  Social Connections: Moderately Integrated   Frequency of Communication with Friends and Family: Twice a week   Frequency of Social Gatherings with Friends and Family: Once a week   Attends Religious Services: More than 4 times per year   Active Member of [employer] or Organizations: No   Attends Music therapist: Never   Marital Status: Married    Tobacco Counseling Counseling given: Not Answered   Clinical Intake:  Pre-visit preparation completed: Yes  Pain : No/denies pain     Nutritional Risks: None Diabetes: No  How often do you need to have someone help you when you read instructions, pamphlets, or other written materials from your doctor or pharmacy?: 1 - Never  Diabetic?No  Interpreter Needed?: No  Information entered by :: [place] of Daily Living    12/13/2021   10:28 AM 07/08/2021    4:20 PM  In your present state of health, do you have any difficulty performing the following activities:  Hearing? 1 0  Comment hearing aids   Vision? 0 0  Difficulty concentrating or making decisions? 0 0  Walking or climbing stairs? 0 0  Dressing or bathing? 0 0  Doing errands, shopping? 0 0  Preparing Food and eating ? N   Using the Toilet? N   In the past six months, have you accidently leaked urine? Y   Do you have problems with loss of  bowel control? N   Managing your Medications? N   Managing your Finances? N   Housekeeping or managing your Housekeeping? N     Patient Care Team: Colon Branch, MD as PCP - General (Internal Medicine) Lorretta Harp, MD as PCP - Cardiology (Cardiology) Hayden Pedro, MD as Consulting Physician (Ophthalmology) Hortencia Pilar, MD as Consulting Physician (Ophthalmology) Jari Pigg, MD as Consulting Physician (Dermatology)  Indicate any recent Medical Services you may have received from other than [facility] providers in the past year (date may be approximate).     Assessment:   This is a routine wellness examination for Savannah Jimenez.  Hearing/Vision screen No results found.  Dietary issues and exercise activities discussed: Current Exercise Habits: Home exercise routine, Type of exercise: walking;stretching, Time (Minutes): 20, Frequency (Times/Week): 1, Weekly Exercise (Minutes/Week): 20, Intensity: Mild   Goals Addressed             This Visit's Progress    Patient Stated   On track    Maintain current health and independence.       Patient Stated   On track    Drink more water       Depression Screen    12/13/2021   10:26 AM 06/15/2021    8:50 AM 07/08/2020   10:29 AM 07/07/2019   10:09 AM 07/04/2018    9:09 AM 01/28/2018    2:22 PM 11/22/2016   11:01 AM  PHQ 2/9 Scores  PHQ - 2 Score 0 0 0 0 0 0 0    Fall Risk    12/13/2021   10:25 AM  06/15/2021    8:50 AM 07/08/2020   10:27 AM 07/07/2019   10:08 AM 07/04/2018    9:09 AM  Fall Risk   Falls in the past year? 1 0 0 0 0  Number falls in past yr: 0 0 0    Injury with Fall? 1 0 0    Risk for fall due to : History of fall(s)      Follow up Falls evaluation completed Falls evaluation completed Falls prevention discussed Education provided;Falls prevention discussed     FALL RISK PREVENTION PERTAINING TO THE HOME:  Any stairs in or around the home? Yes  If so, are there any without handrails? No  Home free of loose throw rugs in walkways, pet beds, electrical cords, etc? Yes  Adequate lighting in your home to reduce risk of falls? Yes   ASSISTIVE DEVICES UTILIZED TO PREVENT FALLS:  Life alert? No  Use of a cane, walker or w/c? No  Grab bars in the bathroom? Yes  Shower chair or bench in shower? Yes  Elevated toilet seat or a handicapped toilet? Yes   TIMED UP AND GO:  Was the test performed? No .    Cognitive Function:        12/13/2021   10:30 AM 07/08/2020   10:38 AM  6CIT Screen  What Year? 0 points 0 points  What month? 0 points 0 points  What time? 0 points 0 points  Count back from 20 0 points 0 points  Months in reverse 0 points 0 points  Repeat phrase 0 points   Total Score 0 points     Immunizations Immunization History  Administered Date(s) Administered   Fluad Quad(high Dose 65+) 05/26/2021   Influenza Whole 04/05/2010   Influenza, High Dose Seasonal PF 04/30/2014, 05/04/2015, 04/25/2016, 04/11/2019   Influenza-Unspecified 05/01/2017, 04/25/2018, 04/23/2020   PFIZER(Purple Top)SARS-COV-2 Vaccination 08/13/2019, 09/01/2019,  06/04/2020   Pneumococcal Conjugate-13 06/03/2014   Pneumococcal Polysaccharide-23 04/19/2009, 01/29/2019   Zoster Recombinat (Shingrix) 08/25/2020, 11/09/2020   Zoster, Live 11/04/2007, 12/24/2009    TDAP status: Due, Education has been provided regarding the importance of this vaccine. Advised may receive this  vaccine at local pharmacy or Health Dept. Aware to provide a copy of the vaccination record if obtained from local pharmacy or Health Dept. Verbalized acceptance and understanding.  Flu Vaccine status: Up to date  Pneumococcal vaccine status: Up to date  Covid-19 vaccine status: Declined, Education has been provided regarding the importance of this vaccine but patient still declined. Advised may receive this vaccine at local pharmacy or Health Dept.or vaccine clinic. Aware to provide a copy of the vaccination record if obtained from local pharmacy or Health Dept. Verbalized acceptance and understanding.  Qualifies for Shingles Vaccine? Yes   Zostavax completed No   Shingrix Completed?: Yes  Screening Tests Health Maintenance  Topic Date Due   COVID-19 Vaccine (4 - Booster for Pfizer series) 07/30/2020   INFLUENZA VACCINE  02/21/2022   Pneumonia Vaccine 50+ Years old  Completed   DEXA SCAN  Completed   Zoster Vaccines- Shingrix  Completed   HPV VACCINES  Aged Out   TETANUS/TDAP  Discontinued    Health Maintenance  Health Maintenance Due  Topic Date Due   COVID-19 Vaccine (4 - Booster for [place] series) 07/30/2020    Colorectal cancer screening: No longer required.   Mammogram status: Completed 12/27/20. Repeat every year  Bone Density status: Ordered 12/13/21. Pt provided with contact info and advised to call to schedule appt.  Lung Cancer Screening: (Low Dose CT Chest recommended if Age 49-80 years, 30 pack-year currently smoking OR have quit w/in 15years.) does not qualify.   Lung Cancer Screening Referral: N/A  Additional Screening:  Hepatitis C Screening: does not qualify; Completed aged out  Vision Screening: Recommended annual ophthalmology exams for early detection of glaucoma and other disorders of the eye. Is the patient up to date with their annual eye exam?  Yes  Who is the provider or what is the name of the office in which the patient attends annual eye exams?  Dr. Herbert Deaner If pt is not established with a provider, would they like to be referred to a provider to establish care? No .   Dental Screening: Recommended annual dental exams for proper oral hygiene  Community Resource Referral / Chronic Care Management: CRR required this visit?  No   CCM required this visit?  No      Plan:     I have personally reviewed and noted the following in the patient's chart:   Medical and social history Use of alcohol, tobacco or illicit drugs  Current medications and supplements including opioid prescriptions.  Functional ability and status Nutritional status Physical activity Advanced directives List of other physicians Hospitalizations, surgeries, and ER visits in previous 12 months Vitals Screenings to include cognitive, depression, and falls Referrals and appointments  In addition, I have reviewed and discussed with patient certain preventive protocols, quality metrics, and best practice recommendations. A written personalized care plan for preventive services as well as general preventive health recommendations were provided to patient.   Due to this being a telephonic visit, the after visit summary with patients personalized plan was offered to patient via mail or my-chart.  Patient would like to access on my-chart.  Duard Brady Jorja Empie, [place]   12/13/2021   Nurse Notes: dexa scan  I have reviewed  and agree with [facility] documentation.  Kathlene November, MD

## 2021-12-13 NOTE — Patient Instructions (Signed)
Ms. Savannah Jimenez , Thank you for taking time to come for your Medicare Wellness Visit. I appreciate your ongoing commitment to your health goals. Please review the following plan we discussed and let me know if I can assist you in the future.   Screening recommendations/referrals: Colonoscopy: no longer needed Mammogram: 12/27/20 due 12/27/21 Bone Density: ordered 12/13/21 Recommended yearly ophthalmology/optometry visit for glaucoma screening and checkup Recommended yearly dental visit for hygiene and checkup  Vaccinations: Influenza vaccine: up to date Pneumococcal vaccine: up to date Tdap vaccine: declined Shingles vaccine: up to date   Covid-19:Due-May obtain vaccine at your local pharmacy.   Advanced directives: yes, on file  Conditions/risks identified: see problem list   Next appointment: Follow up in one year for your annual wellness visit    Preventive Care 65 Years and Older, Female Preventive care refers to lifestyle choices and visits with your health care provider that can promote health and wellness. What does preventive care include? A yearly physical exam. This is also called an annual well check. Dental exams once or twice a year. Routine eye exams. Ask your health care provider how often you should have your eyes checked. Personal lifestyle choices, including: Daily care of your teeth and gums. Regular physical activity. Eating a healthy diet. Avoiding tobacco and drug use. Limiting alcohol use. Practicing safe sex. Taking low-dose aspirin every day. Taking vitamin and mineral supplements as recommended by your health care provider. What happens during an annual well check? The services and screenings done by your health care provider during your annual well check will depend on your age, overall health, lifestyle risk factors, and family history of disease. Counseling  Your health care provider may ask you questions about your: Alcohol use. Tobacco use. Drug  use. Emotional well-being. Home and relationship well-being. Sexual activity. Eating habits. History of falls. Memory and ability to understand (cognition). Work and work Statistician. Reproductive health. Screening  You may have the following tests or measurements: Height, weight, and BMI. Blood pressure. Lipid and cholesterol levels. These may be checked every 5 years, or more frequently if you are over 65 years old. Skin check. Lung cancer screening. You may have this screening every year starting at age 72 if you have a 30-pack-year history of smoking and currently smoke or have quit within the past 15 years. Fecal occult blood test (FOBT) of the stool. You may have this test every year starting at age 68. Flexible sigmoidoscopy or colonoscopy. You may have a sigmoidoscopy every 5 years or a colonoscopy every 10 years starting at age 78. Hepatitis C blood test. Hepatitis B blood test. Sexually transmitted disease (STD) testing. Diabetes screening. This is done by checking your blood sugar (glucose) after you have not eaten for a while (fasting). You may have this done every 1-3 years. Bone density scan. This is done to screen for osteoporosis. You may have this done starting at age 48. Mammogram. This may be done every 1-2 years. Talk to your health care provider about how often you should have regular mammograms. Talk with your health care provider about your test results, treatment options, and if necessary, the need for more tests. Vaccines  Your health care provider may recommend certain vaccines, such as: Influenza vaccine. This is recommended every year. Tetanus, diphtheria, and acellular pertussis (Tdap, Td) vaccine. You may need a Td booster every 10 years. Zoster vaccine. You may need this after age 20. Pneumococcal 13-valent conjugate (PCV13) vaccine. One dose is recommended after age 49.  Pneumococcal polysaccharide (PPSV23) vaccine. One dose is recommended after age  59. Talk to your health care provider about which screenings and vaccines you need and how often you need them. This information is not intended to replace advice given to you by your health care provider. Make sure you discuss any questions you have with your health care provider. Document Released: 08/06/2015 Document Revised: 03/29/2016 Document Reviewed: 05/11/2015 Elsevier Interactive Patient Education  2017 [place] Prevention in the Home Falls can cause injuries. They can happen to people of all ages. There are many things you can do to make your home safe and to help prevent falls. What can I do on the outside of my home? Regularly fix the edges of walkways and driveways and fix any cracks. Remove anything that might make you trip as you walk through a door, such as a raised step or threshold. Trim any bushes or trees on the path to your home. Use bright outdoor lighting. Clear any walking paths of anything that might make someone trip, such as rocks or tools. Regularly check to see if handrails are loose or broken. Make sure that both sides of any steps have handrails. Any raised decks and porches should have guardrails on the edges. Have any leaves, snow, or ice cleared regularly. Use sand or salt on walking paths during winter. Clean up any spills in your garage right away. This includes oil or grease spills. What can I do in the bathroom? Use night lights. Install grab bars by the toilet and in the tub and shower. Do not use towel bars as grab bars. Use non-skid mats or decals in the tub or shower. If you need to sit down in the shower, use a plastic, non-slip stool. Keep the floor dry. Clean up any water that spills on the floor as soon as it happens. Remove soap buildup in the tub or shower regularly. Attach bath mats securely with double-sided non-slip rug tape. Do not have throw rugs and other things on the floor that can make you trip. What can I do in the  bedroom? Use night lights. Make sure that you have a light by your bed that is easy to reach. Do not use any sheets or blankets that are too big for your bed. They should not hang down onto the floor. Have a firm chair that has side arms. You can use this for support while you get dressed. Do not have throw rugs and other things on the floor that can make you trip. What can I do in the kitchen? Clean up any spills right away. Avoid walking on wet floors. Keep items that you use a lot in easy-to-reach places. If you need to reach something above you, use a strong step stool that has a grab bar. Keep electrical cords out of the way. Do not use floor polish or wax that makes floors slippery. If you must use wax, use non-skid floor wax. Do not have throw rugs and other things on the floor that can make you trip. What can I do with my stairs? Do not leave any items on the stairs. Make sure that there are handrails on both sides of the stairs and use them. Fix handrails that are broken or loose. Make sure that handrails are as long as the stairways. Check any carpeting to make sure that it is firmly attached to the stairs. Fix any carpet that is loose or worn. Avoid having throw rugs at the  top or bottom of the stairs. If you do have throw rugs, attach them to the floor with carpet tape. Make sure that you have a light switch at the top of the stairs and the bottom of the stairs. If you do not have them, ask someone to add them for you. What else can I do to help prevent falls? Wear shoes that: Do not have high heels. Have rubber bottoms. Are comfortable and fit you well. Are closed at the toe. Do not wear sandals. If you use a stepladder: Make sure that it is fully opened. Do not climb a closed stepladder. Make sure that both sides of the stepladder are locked into place. Ask someone to hold it for you, if possible. Clearly mark and make sure that you can see: Any grab bars or  handrails. First and last steps. Where the edge of each step is. Use tools that help you move around (mobility aids) if they are needed. These include: Canes. Walkers. Scooters. Crutches. Turn on the lights when you go into a dark area. Replace any light bulbs as soon as they burn out. Set up your furniture so you have a clear path. Avoid moving your furniture around. If any of your floors are uneven, fix them. If there are any pets around you, be aware of where they are. Review your medicines with your doctor. Some medicines can make you feel dizzy. This can increase your chance of falling. Ask your doctor what other things that you can do to help prevent falls. This information is not intended to replace advice given to you by your health care provider. Make sure you discuss any questions you have with your health care provider. Document Released: 05/06/2009 Document Revised: 12/16/2015 Document Reviewed: 08/14/2014 Elsevier Interactive Patient Education  2017 [employer].

## 2022-01-02 ENCOUNTER — Ambulatory Visit (HOSPITAL_BASED_OUTPATIENT_CLINIC_OR_DEPARTMENT_OTHER)
Admission: RE | Admit: 2022-01-02 | Discharge: 2022-01-02 | Disposition: A | Discharge status: Home / Self Care | Payer: Medicare Other | Source: Ambulatory Visit | Attending: Internal Medicine | Admitting: Internal Medicine

## 2022-01-02 ENCOUNTER — Encounter (HOSPITAL_BASED_OUTPATIENT_CLINIC_OR_DEPARTMENT_OTHER): Payer: Self-pay

## 2022-01-02 DIAGNOSIS — Z1231 Encounter for screening mammogram for malignant neoplasm of breast: Secondary | ICD-10-CM | POA: Insufficient documentation

## 2022-01-02 DIAGNOSIS — Z780 Asymptomatic menopausal state: Secondary | ICD-10-CM | POA: Insufficient documentation

## 2022-01-02 DIAGNOSIS — M8589 Other specified disorders of bone density and structure, multiple sites: Secondary | ICD-10-CM | POA: Diagnosis not present

## 2022-01-16 ENCOUNTER — Telehealth: Payer: Self-pay

## 2022-01-16 NOTE — Telephone Encounter (Signed)
A user error has taken place: encounter opened in error, closed for administrative reasons.

## 2022-01-16 NOTE — Telephone Encounter (Signed)
Conrad Spray, CMA  Dierdre Searles, CMA Can you get [place] a price on Prolia for this Pt please?   Thank you.

## 2022-01-19 ENCOUNTER — Other Ambulatory Visit: Payer: Self-pay | Admitting: Cardiovascular Disease

## 2022-01-19 NOTE — Telephone Encounter (Signed)
Prescription refill request for Eliquis received. Indication: PAF Last office visit: 11/22/21  Adora Fridge MD Scr: 0.69 on 08/11/21 Age: 86 Weight: 49kg  Based on above findings Eliquis 2.'5mg'$  twice daily is the appropriate dose.  Refill approved.

## 2022-01-20 NOTE — Telephone Encounter (Signed)
Prolia VOB initiated via [email or web address] ? ?New start ? ?

## 2022-01-29 NOTE — Telephone Encounter (Addendum)
Pt ready for scheduling on or after 01/29/22  Out-of-pocket cost due at time of visit: $0  Primary: Medicare Prolia co-insurance: 20% (approximately $276) Admin fee co-insurance: 20% (approximately $25)  Secondary: AARP Medicare Supp Prolia co-insurance: Covers Medicare Part B co-insurance Admin fee co-insurance: Covers Medicare Part B co-insurance  Deductible:  Covered by secondary  Prior Auth: not required PA# Valid:   ** This summary of benefits is an estimation of the patient's out-of-pocket cost. Exact cost may vary based on individual plan coverage.

## 2022-01-30 ENCOUNTER — Encounter: Payer: Self-pay | Admitting: Internal Medicine

## 2022-01-30 ENCOUNTER — Ambulatory Visit (INDEPENDENT_AMBULATORY_CARE_PROVIDER_SITE_OTHER): Payer: Medicare Other | Admitting: Internal Medicine

## 2022-01-30 VITALS — BP 120/76 | HR 73 | Temp 98.0°F | Resp 16 | Ht 63.0 in | Wt 107.1 lb

## 2022-01-30 DIAGNOSIS — M810 Age-related osteoporosis without current pathological fracture: Secondary | ICD-10-CM

## 2022-01-30 DIAGNOSIS — F419 Anxiety disorder, unspecified: Secondary | ICD-10-CM

## 2022-01-30 DIAGNOSIS — G4700 Insomnia, unspecified: Secondary | ICD-10-CM | POA: Diagnosis not present

## 2022-01-30 DIAGNOSIS — I2510 Atherosclerotic heart disease of native coronary artery without angina pectoris: Secondary | ICD-10-CM

## 2022-01-30 MED ORDER — ZOLPIDEM TARTRATE 5 MG PO TABS: 5.0000 mg | ORAL_TABLET | Freq: Every evening | ORAL | 0 refills | Status: DC | PRN

## 2022-01-30 NOTE — Progress Notes (Unsigned)
Subjective:    Patient ID: Savannah Jimenez, female    DOB: 04-May-1936, 86 y.o.   MRN: [id number]  DOS:  01/30/2022 Type of visit - description: Follow-up, here by herself   Since the last office visit is feeling well and has no major concerns. Anxiety is well controlled Needs a refill of Ambien.  Had a recent bone density test, this was discussed with the patient.  Saw cardiology, note reviewed.  Review of Systems Denies chest pain or difficulty breathing No lower extremity edema or palpitation  Past Medical History:  Diagnosis Date   Allergy    Arthritis    BCC (basal cell carcinoma of skin)    GERD (gastroesophageal reflux disease)    Glaucoma suspect    HOH (hard of hearing)    Osteoporosis     Past Surgical History:  Procedure Laterality Date   ABDOMINAL HYSTERECTOMY     still have cervix   ARTERY BIOPSY Right 10/06/2014   Procedure: BIOPSY TEMPORAL ARTERY;  Surgeon: Armandina Gemma, MD;  Location: [facility];  Service: General;  Laterality: Right;   BREAST EXCISIONAL BIOPSY Right    over 20 years ago; lymph nodes removed   CATARACT EXTRACTION  10/2011   Left   colonscopy  2007   INNER EAR SURGERY  1974   both stapes replaced-steal   TUBAL LIGATION      Current Outpatient Medications  Medication Instructions   apixaban (ELIQUIS) 2.5 MG TABS tablet TAKE 1 TABLET BY MOUTH TWICE  DAILY   atorvastatin (LIPITOR) 20 mg, Oral, Daily at bedtime   busPIRone (BUSPAR) 7.5 mg, Oral, 2 times daily   calcium citrate-vitamin D (CITRACAL+D) 315-200 MG-UNIT tablet 1 tablet, Oral, Daily   Cyanocobalamin (B-12) 1000 MCG TABS Oral   fish oil-omega-3 fatty acids 2 g, Oral, Daily,     fluticasone (FLONASE) 50 MCG/ACT nasal spray SHAKE WELL AND USE 2 SPRAYS IN EACH NOSTRIL DAILY   metoprolol succinate (TOPROL-XL) 12.5 mg, Oral, Daily   Multiple Vitamins-Minerals (PRESERVISION AREDS 2) CAPS 1 capsule, Oral, 2 times daily   zolpidem (AMBIEN) 5 mg, Oral, At bedtime PRN        Objective:   Physical Exam BP 120/76   Pulse 73   Temp 98 F (36.7 C) (Oral)   Resp 16   Ht '5\' 3"'$  (1.6 m)   Wt 107 lb 2 oz (48.6 kg)   SpO2 93%   BMI 18.98 kg/m  General:   Well developed, NAD, BMI noted. HEENT:  Normocephalic . Face symmetric, atraumatic Lungs:  CTA B Normal respiratory effort, no intercostal retractions, no accessory muscle use. Heart: RRR,  no murmur.  Lower extremities: no pretibial edema bilaterally  Skin: Not pale. Not jaundice Neurologic:  alert & oriented X3.  Speech normal, gait appropriate for age and unassisted Psych--  Cognition and judgment appear intact.  Cooperative with normal attention span and concentration.  Behavior appropriate. No anxious or depressed appearing.      Assessment    Assessment, transferring from Dr. Lianne Cure (01/2018)  Allergies GERD Osteoporosis: Had actonel before  -Tscore  -2.2 (10-2017), -2.3 (June 2023), offered prolia  Anxiety, insomnia H/o polymyalgia rheumatica; used to see Dr Charlestine Night, [facility] Rota. Bx 2016 Palpable abdominal aorta: Abdominal ultrasound  09/2018 negative for AAA B12 deficiency Ear implants: No MRI  PLAN Osteoporosis: 01/02/2022, DEXA show T score of -2.3, risk of major fracture elevated at 25%, qualify for treatment, the patient previously was intolerant to Fosamax and does not  like a rechallenge.  I advised Prolia, she declines, plans to discuss with her daughter and let me know if interested.  Risk of fractures discussed. Atrial fibrillation: LOV cardiology 11-22-2021, A-fib burden less than 1%, continue anticoagulation. She also had orthostatic hypotension, was rx to drink plenty of fluids. Anxiety, insomnia: Well-controlled on BuSpar and Ambien.  Contract signed, PDMP okay, RF for Ambien sent Preventive care: [place] booster, declined it.  Benefits discussed RTC 4 months

## 2022-01-30 NOTE — Telephone Encounter (Signed)
Discussed w/ Pt- she declines Prolia for now.

## 2022-01-30 NOTE — Patient Instructions (Addendum)
Think about getting the injection every 6 months of a medication called PROLIA to help with osteoporosis   GO TO THE FRONT DESK, [place] back for a checkup in 4 months

## 2022-01-30 NOTE — Telephone Encounter (Signed)
Pt can receive at appointment today.

## 2022-01-31 NOTE — Assessment & Plan Note (Signed)
Osteoporosis: 01/02/2022, DEXA show T score of -2.3, risk of major fracture elevated at 25%, qualify for treatment, the patient previously was intolerant to Fosamax and does not like a rechallenge.  I advised Prolia, she declines, plans to discuss with her daughter and let me know if interested.  Risk of fractures discussed. Atrial fibrillation: LOV cardiology 11-22-2021, A-fib burden less than 1%, continue anticoagulation. She also had orthostatic hypotension, was rx to drink plenty of fluids. Anxiety, insomnia: Well-controlled on BuSpar and Ambien.  Contract signed, PDMP okay, RF for Ambien sent Preventive care: [place] booster, declined it.  Benefits discussed RTC 4 months

## 2022-02-07 ENCOUNTER — Telehealth: Payer: Self-pay | Admitting: Internal Medicine

## 2022-02-07 DIAGNOSIS — H9193 Unspecified hearing loss, bilateral: Secondary | ICD-10-CM

## 2022-02-07 NOTE — Telephone Encounter (Signed)
Pt called stating she needs a referral sent to her audiologist for a hearing test. The Info for the office is as follows:  [facility] [address] [address], [place], [place] [address] P: [phone] F: [phone]

## 2022-02-07 NOTE — Telephone Encounter (Signed)
Audiology referral placed.

## 2022-02-16 ENCOUNTER — Other Ambulatory Visit: Payer: Self-pay | Admitting: Internal Medicine

## 2022-02-22 DIAGNOSIS — H906 Mixed conductive and sensorineural hearing loss, bilateral: Secondary | ICD-10-CM | POA: Diagnosis not present

## 2022-04-10 ENCOUNTER — Ambulatory Visit (HOSPITAL_BASED_OUTPATIENT_CLINIC_OR_DEPARTMENT_OTHER)
Admission: RE | Admit: 2022-04-10 | Discharge: 2022-04-10 | Disposition: A | Discharge status: Home / Self Care | Payer: Medicare Other | Source: Ambulatory Visit | Attending: Pulmonary Disease | Admitting: Pulmonary Disease

## 2022-04-10 DIAGNOSIS — R911 Solitary pulmonary nodule: Secondary | ICD-10-CM | POA: Insufficient documentation

## 2022-04-10 DIAGNOSIS — Q676 Pectus excavatum: Secondary | ICD-10-CM | POA: Insufficient documentation

## 2022-04-10 DIAGNOSIS — J984 Other disorders of lung: Secondary | ICD-10-CM | POA: Insufficient documentation

## 2022-04-26 DIAGNOSIS — Z23 Encounter for immunization: Secondary | ICD-10-CM | POA: Diagnosis not present

## 2022-05-10 DIAGNOSIS — Z85828 Personal history of other malignant neoplasm of skin: Secondary | ICD-10-CM | POA: Diagnosis not present

## 2022-05-10 DIAGNOSIS — L578 Other skin changes due to chronic exposure to nonionizing radiation: Secondary | ICD-10-CM | POA: Diagnosis not present

## 2022-05-10 DIAGNOSIS — L570 Actinic keratosis: Secondary | ICD-10-CM | POA: Diagnosis not present

## 2022-05-10 DIAGNOSIS — Z86018 Personal history of other benign neoplasm: Secondary | ICD-10-CM | POA: Diagnosis not present

## 2022-05-10 DIAGNOSIS — D225 Melanocytic nevi of trunk: Secondary | ICD-10-CM | POA: Diagnosis not present

## 2022-05-10 DIAGNOSIS — L814 Other melanin hyperpigmentation: Secondary | ICD-10-CM | POA: Diagnosis not present

## 2022-05-15 ENCOUNTER — Other Ambulatory Visit: Payer: Self-pay | Admitting: Cardiovascular Disease

## 2022-05-17 ENCOUNTER — Encounter: Payer: Self-pay | Admitting: Pulmonary Disease

## 2022-05-17 ENCOUNTER — Ambulatory Visit (INDEPENDENT_AMBULATORY_CARE_PROVIDER_SITE_OTHER): Payer: Medicare Other | Admitting: Pulmonary Disease

## 2022-05-17 VITALS — BP 120/60 | HR 72 | Ht 63.0 in | Wt 110.0 lb

## 2022-05-17 DIAGNOSIS — J984 Other disorders of lung: Secondary | ICD-10-CM

## 2022-05-17 DIAGNOSIS — Q676 Pectus excavatum: Secondary | ICD-10-CM

## 2022-05-17 DIAGNOSIS — R0609 Other forms of dyspnea: Secondary | ICD-10-CM | POA: Diagnosis not present

## 2022-05-17 DIAGNOSIS — R911 Solitary pulmonary nodule: Secondary | ICD-10-CM | POA: Diagnosis not present

## 2022-05-17 NOTE — Progress Notes (Signed)
Synopsis: Referred in December 2022 for lung nodule by Colon Branch, MD  Subjective:   PATIENT ID: Savannah Jimenez GENDER: female DOB: March 31, 1936, MRN: [id number]  Chief Complaint  Patient presents with   Follow-up    CT     This is a 86 year old female, past medical history of basal cell carcinoma of the skin, osteoporosis, arthritis, gastroesophageal reflux disease.  Patient is a never smoker.Patient had a CT scan of the chest on 06/27/2021 in the emergency department.  Patient was found to have a right middle lobe 22 mm nodular opacity concerning for possible underlying malignancy.  Addition could represent a inflammatory or infectious lesion.  Patient also has significant pectus excavatum with compression of the heart and right ventricle.  CT imaging reviewed today in the office.  CT imaging was completed after fall evaluation in the emergency department.  OV 07/08/2021: Here today for follow-up regarding abnormal CT imaging.  She also has complaints of shortness of breath.  This has been slowly progressive as she has gotten older.  Predominantly associated with exertion.  She also has had 2 episodes of passing out this month.  Seen in the ER with concern of TIA also followed up with ophthalmologist.  She has echocardiogram ordered by her primary care provider and has follow-up with PCP Dr. Larose Kells today.  OV 08/17/2021: Here today for follow-up after nuclear medicine pet imaging.  Patient last seen in the office for right middle lobe lobe 22 mm nodular opacity.Patient had a nuclear medicine PET scan on 07/22/2021.  This PET scan showed a mildly reduced size of the nodular reason of concern within the anterior portion of the right lower lobe currently now measuring 1.2 x 0.6 cm and formally was 1.5 x 0.8 cm.  There is only low-level metabolic activity with an SUV max of 1.7.  Reduction in size and change in morphology would suggest inflammatory or benign etiology.  Patient also has significant pectus  excavatum.  Also had recent work-up with cardiology for Holter monitor.  OV 05/17/2022: Here today for follow-up.  She had a repeat CT scan of the chest which shows stability of the nodules.  She has evidence of possible MAI.  Pectus excavatum still present.  Little round density in the right middle lobe ovoid in shape stable.    Past Medical History:  Diagnosis Date   Allergy    Arthritis    BCC (basal cell carcinoma of skin)    GERD (gastroesophageal reflux disease)    Glaucoma suspect    HOH (hard of hearing)    Osteoporosis      Family History  Problem Relation Age of Onset   Heart failure Mother    Hypertension Sister    Asthma Sister    Hypertension Brother      Past Surgical History:  Procedure Laterality Date   ABDOMINAL HYSTERECTOMY     still have cervix   ARTERY BIOPSY Right 10/06/2014   Procedure: BIOPSY TEMPORAL ARTERY;  Surgeon: Armandina Gemma, MD;  Location: [facility];  Service: General;  Laterality: Right;   BREAST EXCISIONAL BIOPSY Right    over 20 years ago; lymph nodes removed   CATARACT EXTRACTION  10/2011   Left   colonscopy  2007   INNER EAR SURGERY  1974   both stapes replaced-steal   TUBAL LIGATION      Social History   Socioeconomic History   Marital status: Married    Spouse name: Not on file  Number of children: 2   Years of education: Not on file   Highest education level: Not on file  Occupational History   Occupation: retired , day care  Tobacco Use   Smoking status: Never   Smokeless tobacco: Never  Substance and Sexual Activity   Alcohol use: Yes    Comment: rarely   Drug use: No   Sexual activity: Not on file  Other Topics Concern   Not on file  Social History Narrative   Household: pt and husband       2 g- children   2 g-g- children   Social Determinants of Health   Financial Resource Strain: Low Risk  (12/13/2021)   Overall Financial Resource Strain (CARDIA)    Difficulty of Paying Living Expenses:  Not hard at all  Food Insecurity: No Food Insecurity (12/13/2021)   Hunger Vital Sign    Worried About Running Out of Food in the Last Year: Never true    [place] in the Last Year: Never true  Transportation Needs: No Transportation Needs (12/13/2021)   PRAPARE - Hydrologist (Medical): No    Lack of Transportation (Non-Medical): No  Physical Activity: Insufficiently Active (12/13/2021)   Exercise Vital Sign    Days of Exercise per Week: 1 day    Minutes of Exercise per Session: 20 min  Stress: No Stress Concern Present (12/13/2021)   [place]    Feeling of Stress : Not at all  Social Connections: Moderately Integrated (12/13/2021)   Social Connection and Isolation Panel [NHANES]    Frequency of Communication with Friends and Family: Twice a week    Frequency of Social Gatherings with Friends and Family: Once a week    Attends Religious Services: More than 4 times per year    Active Member of [employer] or Organizations: No    Attends Archivist Meetings: Never    Marital Status: Married  Human resources officer Violence: Not At Risk (12/13/2021)   Humiliation, Afraid, Rape, and Kick questionnaire    Fear of Current or Ex-Partner: No    Emotionally Abused: No    Physically Abused: No    Sexually Abused: No     Allergies  Allergen Reactions   Tetanus Toxoids     Arm swelling    Penicillins    Sulfacetamide Sodium      Outpatient Medications Prior to Visit  Medication Sig Dispense Refill   fluticasone (FLONASE) 50 MCG/ACT nasal spray SHAKE WELL AND USE 2 SPRAYS IN EACH NOSTRIL DAILY 16 g 3   apixaban (ELIQUIS) 2.5 MG TABS tablet TAKE 1 TABLET BY MOUTH TWICE  DAILY 180 tablet 1   atorvastatin (LIPITOR) 20 MG tablet Take 1 tablet (20 mg total) by mouth at bedtime. 90 tablet 3   busPIRone (BUSPAR) 7.5 MG tablet TAKE 1 TABLET BY MOUTH TWICE  DAILY 180 tablet 1   calcium  citrate-vitamin D (CITRACAL+D) 315-200 MG-UNIT tablet Take 1 tablet by mouth daily.     Cyanocobalamin (B-12) 1000 MCG TABS Take by mouth.     fish oil-omega-3 fatty acids 1000 MG capsule Take 2 g by mouth daily.     metoprolol succinate (TOPROL-XL) 25 MG 24 hr tablet TAKE ONE-HALF TABLET BY MOUTH  DAILY 45 tablet 3   Multiple Vitamins-Minerals (PRESERVISION AREDS 2) CAPS Take 1 capsule by mouth 2 (two) times daily.      zolpidem (AMBIEN) 5 MG tablet  Take 1 tablet (5 mg total) by mouth at bedtime as needed. 90 tablet 0   No facility-administered medications prior to visit.    Review of Systems  Constitutional:  Negative for chills, fever, malaise/fatigue and weight loss.  HENT:  Negative for hearing loss, sore throat and tinnitus.   Eyes:  Negative for blurred vision and double vision.  Respiratory:  Positive for shortness of breath. Negative for cough, hemoptysis, sputum production, wheezing and stridor.   Cardiovascular:  Negative for chest pain, palpitations, orthopnea, leg swelling and PND.  Gastrointestinal:  Negative for abdominal pain, constipation, diarrhea, heartburn, nausea and vomiting.  Genitourinary:  Negative for dysuria, hematuria and urgency.  Musculoskeletal:  Negative for joint pain and myalgias.  Skin:  Negative for itching and rash.  Neurological:  Negative for dizziness, tingling, weakness and headaches.  Endo/Heme/Allergies:  Negative for environmental allergies. Does not bruise/bleed easily.  Psychiatric/Behavioral:  Negative for depression. The patient is not nervous/anxious and does not have insomnia.   All other systems reviewed and are negative.    Objective:  Physical Exam Vitals reviewed.  Constitutional:      General: She is not in acute distress.    Appearance: She is well-developed.  HENT:     Head: Normocephalic and atraumatic.  Eyes:     General: No scleral icterus.    Conjunctiva/sclera: Conjunctivae normal.     Pupils: Pupils are equal, round,  and reactive to light.  Neck:     Vascular: No JVD.     Trachea: No tracheal deviation.  Cardiovascular:     Rate and Rhythm: Normal rate and regular rhythm.     Heart sounds: Normal heart sounds. No murmur heard. Pulmonary:     Effort: Pulmonary effort is normal. No tachypnea, accessory muscle usage or respiratory distress.     Breath sounds: No stridor. No wheezing, rhonchi or rales.  Abdominal:     General: There is no distension.     Palpations: Abdomen is soft.     Tenderness: There is no abdominal tenderness.  Musculoskeletal:        General: No tenderness.     Cervical back: Neck supple.     Comments: Severe pectus excavatum  Lymphadenopathy:     Cervical: No cervical adenopathy.  Skin:    General: Skin is warm and dry.     Capillary Refill: Capillary refill takes less than 2 seconds.     Findings: No rash.  Neurological:     Mental Status: She is alert and oriented to person, place, and time.  Psychiatric:        Behavior: Behavior normal.      Vitals:   05/17/22 1054  BP: 120/60  Pulse: 72  SpO2: 95%  Weight: 110 lb (49.9 kg)  Height: '5\' 3"'$  (1.6 m)   95% on RA BMI Readings from Last 3 Encounters:  05/17/22 19.49 kg/m  01/30/22 18.98 kg/m  11/22/21 19.13 kg/m   Wt Readings from Last 3 Encounters:  05/17/22 110 lb (49.9 kg)  01/30/22 107 lb 2 oz (48.6 kg)  11/22/21 108 lb (49 kg)     CBC    Component Value Date/Time   WBC 6.2 07/06/2021 1116   RBC 3.92 07/06/2021 1116   HGB 12.2 07/06/2021 1116   HCT 37.1 07/06/2021 1116   PLT 205 07/06/2021 1116   MCV 94.6 07/06/2021 1116   MCH 31.1 07/06/2021 1116   MCHC 32.9 07/06/2021 1116   RDW 13.2 07/06/2021 1116   LYMPHSABS  0.8 07/06/2021 1116   MONOABS 0.4 07/06/2021 1116   EOSABS 0.0 07/06/2021 1116   BASOSABS 0.0 07/06/2021 1116     Chest Imaging: 06/27/2021 CT chest: 22 mm irregular shaped nodular opacity adjacent to the pleura of the right middle lobe.  Could represent underlying  malignancy versus inflammatory lesion.  More of the associated changes within the area are suggestive of inflammatory lesion. The patient's images have been independently reviewed by me.    04/10/2022 CT chest: Elliptical/ovoid 1.3 x 0.7 cm opacity in the anterior right lower lobe continues to be seen.  Stable in size. The patient's images have been independently reviewed by me.    Pulmonary Functions Testing Results:     No data to display          FeNO:   Pathology:   Echocardiogram:   Heart Catheterization:     Assessment & Plan:     ICD-10-CM   1. Lung nodule  R91.1 CT CHEST WO CONTRAST    2. Nodule of middle lobe of right lung  R91.1     3. Pectus excavatum  Q67.6     4. Restrictive lung disease  J98.4     5. DOE (dyspnea on exertion)  R06.09        Discussion:  This is an 86 year old female initially saw with a right middle lobe pulmonary nodule nuclear medicine imaging shows slight progression of the nodule with an SUV of 1.7.  Follow-up CT imaging with nodule that is stable.  Likely inflammatory in nature.  Has other evidence of possible underlying MAI.  She does have severe pectus excavatum with a Haller index of 5.275.  These are often associated with hemodynamic consequences but due to her age she is not a surgical candidate for reconstruction.  With significant exertion I think she could potentially develop presyncopal symptoms.  We talked about this today in the office.  Plan: Encouraged to stay well-hydrated Repeat noncontrasted CT scan of the chest in 18 months. She can follow-up with [place] in 18 months after the CT is complete for nodule. RTC, SG, NP or me.   Current Outpatient Medications:    fluticasone (FLONASE) 50 MCG/ACT nasal spray, SHAKE WELL AND USE 2 SPRAYS IN EACH NOSTRIL DAILY, Disp: 16 g, Rfl: 3   apixaban (ELIQUIS) 2.5 MG TABS tablet, TAKE 1 TABLET BY MOUTH TWICE  DAILY, Disp: 180 tablet, Rfl: 1   atorvastatin (LIPITOR) 20 MG tablet,  Take 1 tablet (20 mg total) by mouth at bedtime., Disp: 90 tablet, Rfl: 3   busPIRone (BUSPAR) 7.5 MG tablet, TAKE 1 TABLET BY MOUTH TWICE  DAILY, Disp: 180 tablet, Rfl: 1   calcium citrate-vitamin D (CITRACAL+D) 315-200 MG-UNIT tablet, Take 1 tablet by mouth daily., Disp: , Rfl:    Cyanocobalamin (B-12) 1000 MCG TABS, Take by mouth., Disp: , Rfl:    fish oil-omega-3 fatty acids 1000 MG capsule, Take 2 g by mouth daily., Disp: , Rfl:    metoprolol succinate (TOPROL-XL) 25 MG 24 hr tablet, TAKE ONE-HALF TABLET BY MOUTH  DAILY, Disp: 45 tablet, Rfl: 3   Multiple Vitamins-Minerals (PRESERVISION AREDS 2) CAPS, Take 1 capsule by mouth 2 (two) times daily. , Disp: , Rfl:    zolpidem (AMBIEN) 5 MG tablet, Take 1 tablet (5 mg total) by mouth at bedtime as needed., Disp: 90 tablet, Rfl: 0   Garner Nash, DO [facility] 05/17/2022 11:26 AM

## 2022-05-17 NOTE — Patient Instructions (Addendum)
Thank you for visiting Dr. Valeta Harms at [facility]. Today we recommend the following:  Orders Placed This Encounter  Procedures   CT CHEST WO CONTRAST   Return in about 18 months (around 11/16/2023) for with Eric Form, NP, or Dr. Valeta Harms.    Please do your part to reduce the spread of COVID-19.

## 2022-05-26 ENCOUNTER — Ambulatory Visit (INDEPENDENT_AMBULATORY_CARE_PROVIDER_SITE_OTHER): Payer: Medicare Other | Admitting: Internal Medicine

## 2022-05-26 ENCOUNTER — Encounter: Payer: Self-pay | Admitting: Internal Medicine

## 2022-05-26 VITALS — BP 120/76 | HR 68 | Temp 98.2°F | Resp 16 | Ht 63.0 in | Wt 107.4 lb

## 2022-05-26 DIAGNOSIS — Z7185 Encounter for immunization safety counseling: Secondary | ICD-10-CM | POA: Diagnosis not present

## 2022-05-26 DIAGNOSIS — I4891 Unspecified atrial fibrillation: Secondary | ICD-10-CM | POA: Diagnosis not present

## 2022-05-26 DIAGNOSIS — R911 Solitary pulmonary nodule: Secondary | ICD-10-CM | POA: Diagnosis not present

## 2022-05-26 DIAGNOSIS — F419 Anxiety disorder, unspecified: Secondary | ICD-10-CM | POA: Diagnosis not present

## 2022-05-26 DIAGNOSIS — M810 Age-related osteoporosis without current pathological fracture: Secondary | ICD-10-CM | POA: Diagnosis not present

## 2022-05-26 DIAGNOSIS — I2510 Atherosclerotic heart disease of native coronary artery without angina pectoris: Secondary | ICD-10-CM | POA: Diagnosis not present

## 2022-05-26 DIAGNOSIS — E782 Mixed hyperlipidemia: Secondary | ICD-10-CM | POA: Diagnosis not present

## 2022-05-26 LAB — BASIC METABOLIC PANEL
BUN: 23 mg/dL (ref 6–23)
CO2: 31 mEq/L (ref 19–32)
Calcium: 9.8 mg/dL (ref 8.4–10.5)
Chloride: 101 mEq/L (ref 96–112)
Creatinine, Ser: 0.7 mg/dL (ref 0.40–1.20)
GFR: 79.71 mL/min (ref >60.00)
Glucose, Bld: 92 mg/dL (ref 70–99)
Potassium: 5 mEq/L (ref 3.5–5.1)
Sodium: 140 mEq/L (ref 135–145)

## 2022-05-26 LAB — CBC WITH DIFFERENTIAL/PLATELET
Basophils Absolute: 0 10*3/uL (ref 0.0–0.1)
Basophils Relative: 0.8 % (ref 0.0–3.0)
Eosinophils Absolute: 0 10*3/uL (ref 0.0–0.7)
Eosinophils Relative: 0.4 % (ref 0.0–5.0)
HCT: 40.4 % (ref 36.0–46.0)
Hemoglobin: 13.1 g/dL (ref 12.0–15.0)
Lymphocytes Relative: 28.4 % (ref 12.0–46.0)
Lymphs Abs: 1.3 10*3/uL (ref 0.7–4.0)
MCHC: 32.4 g/dL (ref 30.0–36.0)
MCV: 95.7 fl (ref 78.0–100.0)
Monocytes Absolute: 0.3 10*3/uL (ref 0.1–1.0)
Monocytes Relative: 7 % (ref 3.0–12.0)
Neutro Abs: 3 10*3/uL (ref 1.4–7.7)
Neutrophils Relative %: 63.4 % (ref 43.0–77.0)
Platelets: 198 10*3/uL (ref 150.0–400.0)
RBC: 4.22 Mil/uL (ref 3.87–5.11)
RDW: 13.6 % (ref 11.5–15.5)
WBC: 4.7 10*3/uL (ref 4.0–10.5)

## 2022-05-26 LAB — LIPID PANEL
Cholesterol: 146 mg/dL (ref 0–200)
HDL: 69 mg/dL (ref >39.00)
LDL Cholesterol: 60 mg/dL (ref 0–99)
NonHDL: 77.06
Total CHOL/HDL Ratio: 2
Triglycerides: 87 mg/dL (ref 0.0–149.0)
VLDL: 17.4 mg/dL (ref 0.0–40.0)

## 2022-05-26 NOTE — Patient Instructions (Addendum)
Vaccines I recommend:  Covid booster RSV vaccine  GO TO THE LAB : Get the blood work     GO TO THE FRONT DESK, [place] back for   checkup in 4 to 6 months

## 2022-05-26 NOTE — Progress Notes (Unsigned)
Subjective:    Patient ID: Savannah Jimenez, female    DOB: Nov 05, 1935, 86 y.o.   MRN: [id number]  DOS:  05/26/2022 Type of visit - description: Routine checkup  History of A-fib, anticoagulated, denies chest pain or difficulty breathing.  No edema No blood in the urine or blood in the stools. History of pulmonary nodule: Pulmonary note reviewed, denies cough or sputum production.  Review of Systems See above   Past Medical History:  Diagnosis Date   Allergy    Arthritis    BCC (basal cell carcinoma of skin)    GERD (gastroesophageal reflux disease)    Glaucoma suspect    HOH (hard of hearing)    Osteoporosis     Past Surgical History:  Procedure Laterality Date   ABDOMINAL HYSTERECTOMY     still have cervix   ARTERY BIOPSY Right 10/06/2014   Procedure: BIOPSY TEMPORAL ARTERY;  Surgeon: Armandina Gemma, MD;  Location: [place];  Service: General;  Laterality: Right;   BREAST EXCISIONAL BIOPSY Right    over 20 years ago; lymph nodes removed   CATARACT EXTRACTION  10/2011   Left   colonscopy  2007   INNER EAR SURGERY  1974   both stapes replaced-steal   TUBAL LIGATION      Current Outpatient Medications  Medication Instructions   apixaban (ELIQUIS) 2.5 MG TABS tablet TAKE 1 TABLET BY MOUTH TWICE  DAILY   atorvastatin (LIPITOR) 20 mg, Oral, Daily at bedtime   busPIRone (BUSPAR) 7.5 mg, Oral, 2 times daily   calcium citrate-vitamin D (CITRACAL+D) 315-200 MG-UNIT tablet 1 tablet, Daily   Cholecalciferol (VITAMIN D) 50 MCG (2000 UT) CAPS Oral   Cyanocobalamin (B-12) 1000 MCG TABS Oral   fish oil-omega-3 fatty acids 2 g, Oral, Daily,     fluticasone (FLONASE) 50 MCG/ACT nasal spray SHAKE WELL AND USE 2 SPRAYS IN EACH NOSTRIL DAILY   metoprolol succinate (TOPROL-XL) 12.5 mg, Oral, Daily   Multiple Vitamins-Minerals (PRESERVISION AREDS 2) CAPS 1 capsule, Oral, 2 times daily   zolpidem (AMBIEN) 5 mg, Oral, At bedtime PRN       Objective:   Physical Exam BP  120/76   Pulse 68   Temp 98.2 F (36.8 C) (Oral)   Resp 16   Ht '5\' 3"'$  (1.6 m)   Wt 107 lb 6 oz (48.7 kg)   SpO2 98%   BMI 19.02 kg/m  General:   Well developed, NAD, BMI noted. HEENT:  Normocephalic . Face symmetric, atraumatic Lungs:  CTA B Normal respiratory effort, no intercostal retractions, no accessory muscle use. Heart: RRR,  no murmur.  Lower extremities: no pretibial edema bilaterally  Skin: Not pale. Not jaundice Neurologic:  alert & oriented X3.  Speech normal, gait appropriate for age and unassisted Psych--  Cognition and judgment appear intact.  Cooperative with normal attention span and concentration.  Behavior appropriate. No anxious or depressed appearing.      Assessment    Assessment, transferring from Dr. Lianne Cure (01/2018)  Allergies GERD Anxiety depression Atrial fibrillation/flutter Dx 2022 Osteoporosis: - rx  actonel before  -Tscore  -2.2 (10-2017), -2.3 (June 2023), offered prolia   Palpable abdominal aorta: Abdominal ultrasound  09/2018 negative for AAA B12 deficiency Pulmonary nodule - sees  pulmonary H/o polymyalgia rheumatica; used to see Dr Charlestine Night, Carolee Rota. Bx 2016 Ear implants: No MRI  PLAN Anxiety depression: Well-controlled on BuSpar. Atrial fibrillation: Asymptomatic, anticoagulated, on metoprolol.  Checking labs. High cholesterol: Last LFTs normal, on atorvastatin, check  FLP Osteoporosis: Today states she is not interested in treatment Lung nodule: Saw pulmonary 05/17/2022, felt to be inflammatory in nature, possible MAI. Due to pectus excavatum she may be prone to presyncopal symptoms thus good hydration encouraged Last  CT chest 03-2022, next ~ 1.5 years Preventive care: -Had a flu shot - Strongly declines COVID-vaccine, advised about RSV. RTC 4 to 6 months

## 2022-05-27 DIAGNOSIS — R911 Solitary pulmonary nodule: Secondary | ICD-10-CM | POA: Insufficient documentation

## 2022-05-27 DIAGNOSIS — R918 Other nonspecific abnormal finding of lung field: Secondary | ICD-10-CM | POA: Insufficient documentation

## 2022-05-27 NOTE — Assessment & Plan Note (Signed)
Anxiety depression: Well-controlled on BuSpar. Atrial fibrillation: Asymptomatic, anticoagulated, on metoprolol.  Checking labs. High cholesterol: Last LFTs normal, on atorvastatin, check FLP Osteoporosis: Today states she is not interested in treatment Lung nodule: Saw pulmonary 05/17/2022, felt to be inflammatory in nature, possible MAI. Due to pectus excavatum she may be prone to presyncopal symptoms thus good hydration encouraged Last  CT chest 03-2022, next ~ 1.5 years Preventive care: -Had a flu shot - Strongly declines COVID-vaccine, advised about RSV. RTC 4 to 6 months

## 2022-06-02 ENCOUNTER — Ambulatory Visit: Payer: Medicare Other | Admitting: Internal Medicine

## 2022-06-22 ENCOUNTER — Telehealth: Payer: Self-pay | Admitting: Internal Medicine

## 2022-06-22 NOTE — Telephone Encounter (Signed)
Medication: zolpidem (AMBIEN) 5 MG tablet  Has the patient contacted their pharmacy? No.   Preferred Pharmacy:   [facility] ([place]) - [place], [place] [facility] [address] [place] [address], [place] [address] Phone: [phone]  Fax: [phone]

## 2022-06-23 ENCOUNTER — Other Ambulatory Visit: Payer: Self-pay | Admitting: Family

## 2022-06-23 MED ORDER — ZOLPIDEM TARTRATE 5 MG PO TABS
5.0000 mg | ORAL_TABLET | Freq: Every evening | ORAL | 0 refills | Status: DC | PRN
Start: 2022-06-23 — End: 2022-07-05

## 2022-06-28 ENCOUNTER — Other Ambulatory Visit: Payer: Self-pay | Admitting: Internal Medicine

## 2022-07-05 ENCOUNTER — Other Ambulatory Visit: Payer: Self-pay | Admitting: Internal Medicine

## 2022-07-05 MED ORDER — ZOLPIDEM TARTRATE 5 MG PO TABS: 5.0000 mg | ORAL_TABLET | Freq: Every evening | ORAL | 0 refills | Status: DC | PRN

## 2022-07-05 NOTE — Progress Notes (Signed)
PDMP reviewed, Ambien sent to [facility]

## 2022-07-05 NOTE — Telephone Encounter (Signed)
Rx was mistakenly sent to local pharmacy- Pt wants it sent to [facility]. Rx sent by PCP.    Called and cancelled at [facility].

## 2022-07-20 ENCOUNTER — Other Ambulatory Visit: Payer: Self-pay | Admitting: Cardiovascular Disease

## 2022-07-20 NOTE — Telephone Encounter (Signed)
Prescription refill request for Eliquis received. Indication: PAF Last office visit: 11/22/21  Adora Fridge MD Scr: 0.70 on 05/26/22 Age: 86 Weight: 49kg  Based on above findings Eliquis 2.'5mg'$  twice daily is the appropriate dose.  Refill approved.

## 2022-08-10 ENCOUNTER — Other Ambulatory Visit: Payer: Self-pay | Admitting: Internal Medicine

## 2022-08-13 ENCOUNTER — Other Ambulatory Visit: Payer: Self-pay | Admitting: Internal Medicine

## 2022-10-26 ENCOUNTER — Ambulatory Visit: Payer: Medicare Other | Admitting: Internal Medicine

## 2022-11-20 ENCOUNTER — Other Ambulatory Visit (HOSPITAL_BASED_OUTPATIENT_CLINIC_OR_DEPARTMENT_OTHER): Payer: Self-pay | Admitting: Internal Medicine

## 2022-11-20 DIAGNOSIS — Z1231 Encounter for screening mammogram for malignant neoplasm of breast: Secondary | ICD-10-CM

## 2022-11-22 ENCOUNTER — Encounter: Payer: Self-pay | Admitting: Internal Medicine

## 2022-11-22 ENCOUNTER — Ambulatory Visit (INDEPENDENT_AMBULATORY_CARE_PROVIDER_SITE_OTHER): Payer: Medicare Other | Admitting: Internal Medicine

## 2022-11-22 VITALS — BP 132/80 | HR 74 | Temp 97.6°F | Resp 16 | Ht 63.0 in | Wt 112.0 lb

## 2022-11-22 DIAGNOSIS — F419 Anxiety disorder, unspecified: Secondary | ICD-10-CM

## 2022-11-22 DIAGNOSIS — I4891 Unspecified atrial fibrillation: Secondary | ICD-10-CM | POA: Diagnosis not present

## 2022-11-22 MED ORDER — ZOLPIDEM TARTRATE 5 MG PO TABS: 5.0000 mg | ORAL_TABLET | Freq: Every evening | ORAL | 0 refills | Status: DC | PRN

## 2022-11-22 NOTE — Patient Instructions (Addendum)
Vaccines I recommend: Covid booster Tdap (tetanus) RSV vaccine Flu shot every fall       GO TO THE FRONT DESK, PLEASE SCHEDULE YOUR APPOINTMENTS Come back for a physical exam in 4 months

## 2022-11-22 NOTE — Progress Notes (Unsigned)
Subjective:    Patient ID: Savannah Jimenez, female    DOB: 1935-08-20, 87 y.o.   MRN: [id number]  DOS:  11/22/2022 Type of visit - description: f/u  Since the last office visit is doing well. Other than chronic, mild fatigue she feels well. Good med compliance. Needs a refill on Ambien. Denies chest pain, difficulty breathing. No lower extremity edema No nausea vomiting or blood in the stools  Review of Systems See above   Past Medical History:  Diagnosis Date   Allergy    Arthritis    BCC (basal cell carcinoma of skin)    GERD (gastroesophageal reflux disease)    Glaucoma suspect    HOH (hard of hearing)    Osteoporosis     Past Surgical History:  Procedure Laterality Date   ABDOMINAL HYSTERECTOMY     still have cervix   ARTERY BIOPSY Right 10/06/2014   Procedure: BIOPSY TEMPORAL ARTERY;  Surgeon: Darnell Level, MD;  Location: [facility];  Service: General;  Laterality: Right;   BREAST EXCISIONAL BIOPSY Right    over 20 years ago; lymph nodes removed   CATARACT EXTRACTION  10/2011   Left   colonscopy  2007   INNER EAR SURGERY  1974   both stapes replaced-steal   TUBAL LIGATION      Current Outpatient Medications  Medication Instructions   apixaban (ELIQUIS) 2.5 MG TABS tablet TAKE 1 TABLET BY MOUTH TWICE  DAILY   atorvastatin (LIPITOR) 20 mg, Oral, Daily at bedtime   busPIRone (BUSPAR) 7.5 mg, Oral, 2 times daily   calcium citrate-vitamin D (CITRACAL+D) 315-200 MG-UNIT tablet 1 tablet, Daily   Cholecalciferol (VITAMIN D) 50 MCG (2000 UT) CAPS Oral   Cyanocobalamin (B-12) 1000 MCG TABS Oral   fish oil-omega-3 fatty acids 2 g, Oral, Daily,     fluticasone (FLONASE) 50 MCG/ACT nasal spray SHAKE WELL AND USE 2 SPRAYS IN EACH NOSTRIL DAILY   metoprolol succinate (TOPROL-XL) 12.5 mg, Oral, Daily   Multiple Vitamins-Minerals (PRESERVISION AREDS 2) CAPS 1 capsule, Oral, 2 times daily   zolpidem (AMBIEN) 5 mg, Oral, At bedtime PRN       Objective:    Physical Exam BP 132/80   Pulse 74   Temp 97.6 F (36.4 C) (Oral)   Resp 16   Ht 5\' 3"  (1.6 m)   Wt 112 lb (50.8 kg)   SpO2 96%   BMI 19.84 kg/m  General:   Well developed, NAD, BMI noted. HEENT:  Normocephalic . Face symmetric, atraumatic Lungs:  CTA B Normal respiratory effort, no intercostal retractions, no accessory muscle use. Heart: Seems to be regular today Lower extremities: no pretibial edema bilaterally  Skin: Not pale. Not jaundice Neurologic:  alert & oriented X3.  Speech normal, gait appropriate for age and unassisted Psych--  Cognition and judgment appear intact.  Cooperative with normal attention span and concentration.  Behavior appropriate. No anxious or depressed appearing.      Assessment     Assessment, transferring from Dr. Clifton Custard (01/2018)  Allergies GERD Anxiety depression Atrial fibrillation/flutter Dx 2022 Osteoporosis: - rx  actonel before  -Tscore  -2.2 (10-2017), -2.3 (June 2023), offered prolia   Palpable abdominal aorta: Abdominal ultrasound  09/2018 negative for AAA B12 deficiency Pulmonary nodule - sees  pulmonary H/o polymyalgia rheumatica; used to see Dr Kellie Simmering, [facility] [place]. Bx 2016 Ear implants: No MRI  PLAN  Atrial fibrillation.  LOV cardiology 11/2021, next visit in few days.  Good compliance with Eliquis and  metoprolol without apparent problems. Anxiety, depression: Well-controlled on BuSpar Insomnia: PDMP okay, Ambien refilled. Vaccine advice provided, see AVS. RTC 4 months CPX 11-3 Anxiety depression: Well-controlled on BuSpar. Atrial fibrillation: Asymptomatic, anticoagulated, on metoprolol.  Checking labs. High cholesterol: Last LFTs normal, on atorvastatin, check FLP Osteoporosis: Today states she is not interested in treatment Lung nodule: Saw pulmonary 05/17/2022, felt to be inflammatory in nature, possible MAI. Due to pectus excavatum she may be prone to presyncopal symptoms thus good hydration encouraged Last  CT  chest 03-2022, next ~ 1.5 years Preventive care: -Had a flu shot - Strongly declines COVID-vaccine, advised about RSV. RTC 4 to 6 months

## 2022-11-23 NOTE — Assessment & Plan Note (Signed)
Atrial fibrillation.  LOV cardiology 11/2021, next visit in few days.  Good compliance with Eliquis and metoprolol without apparent problems. Anxiety, depression: Well-controlled on BuSpar Insomnia: PDMP okay, Ambien refilled. Vaccine advice provided, see AVS. RTC 4 months CPX

## 2022-11-28 ENCOUNTER — Encounter: Payer: Self-pay | Admitting: Cardiovascular Disease

## 2022-11-28 ENCOUNTER — Ambulatory Visit: Payer: Medicare Other | Attending: Cardiovascular Disease | Admitting: Cardiovascular Disease

## 2022-11-28 VITALS — Ht 63.0 in | Wt 111.8 lb

## 2022-11-28 DIAGNOSIS — I4892 Unspecified atrial flutter: Secondary | ICD-10-CM | POA: Diagnosis not present

## 2022-11-28 DIAGNOSIS — E782 Mixed hyperlipidemia: Secondary | ICD-10-CM | POA: Diagnosis not present

## 2022-11-28 DIAGNOSIS — I2510 Atherosclerotic heart disease of native coronary artery without angina pectoris: Secondary | ICD-10-CM

## 2022-11-28 NOTE — Assessment & Plan Note (Signed)
History of paroxysmal atrial flutter maintaining sinus rhythm on Eliquis oral anticoagulation. 

## 2022-11-28 NOTE — Assessment & Plan Note (Signed)
History of CAD by coronary CTA performed 08/15/2021 with a coronary calcium score of 196 and mild nonobstructive CAD.  Patient denies chest pain.

## 2022-11-28 NOTE — Progress Notes (Signed)
11/28/2022 Savannah Jimenez   01-May-1936  [id number]  Primary Physician Wanda Plump, MD Primary Cardiologist: Runell Gess MD Nicholes Calamity, [place]  HPI:  Savannah Jimenez is a 87 y.o.   thin-appearing married Caucasian female mother of 2, grandmother of 2 grandchildren who requested provider transferred to me because she was a friend of Savannah Jimenez, a friend and former patient of mine.  She has seen Dr. Royann Shivers back in December and Juanda Crumble 08/25/2021.  She is accompanied by one of her daughters Savannah Jimenez today who is best friends with Savannah Jimenez.  The original referral was because of orthostatic hypotension which is improved since discontinuing Paxil.  She is retired from working in Audiological scientist.  Her only cardiovascular risk factor are treated hyperlipidemia.  She does have a PMR which has been quiescent.  There is no family history.  She has had a TIA in the past.  Since stopping Paxil her orthostatic hypotension symptoms have improved.  She did have a 2-week Zio patch which showed a less than 1% frequency of paroxysmal atrial flutter currently on Eliquis.  She had a coronary CTA that showed minimal plaquing in the LAD, ramus and RCA.  Her hyperlipidemia is under good control on statin therapy.  Since I saw her a year ago she is remained stable.  She no longer has symptoms of orthostatic hypotension.  She is fairly active although she feels somewhat fatigued.  She denies chest pain or shortness of breath.     Current Meds  Medication Sig   apixaban (ELIQUIS) 2.5 MG TABS tablet TAKE 1 TABLET BY MOUTH TWICE  DAILY   atorvastatin (LIPITOR) 20 MG tablet Take 1 tablet (20 mg total) by mouth at bedtime.   busPIRone (BUSPAR) 7.5 MG tablet Take 1 tablet (7.5 mg total) by mouth 2 (two) times daily.   Cholecalciferol (VITAMIN D) 50 MCG (2000 UT) CAPS Take by mouth.   Cyanocobalamin (B-12) 1000 MCG TABS Take by mouth.   fish oil-omega-3 fatty acids 1000 MG capsule Take 2 g by mouth daily.    fluticasone (FLONASE) 50 MCG/ACT nasal spray SHAKE WELL AND USE 2 SPRAYS IN EACH NOSTRIL DAILY   metoprolol succinate (TOPROL-XL) 25 MG 24 hr tablet TAKE ONE-HALF TABLET BY MOUTH  DAILY   Multiple Vitamins-Minerals (PRESERVISION AREDS 2) CAPS Take 1 capsule by mouth 2 (two) times daily.    zolpidem (AMBIEN) 5 MG tablet Take 1 tablet (5 mg total) by mouth at bedtime as needed.     Allergies  Allergen Reactions   Tetanus Toxoids     Arm swelling    Penicillins    Sulfacetamide Sodium    Sulfamethoxazole-Trimethoprim Other (See Comments)    Social History   Socioeconomic History   Marital status: Married    Spouse name: Not on file   Number of children: 2   Years of education: Not on file   Highest education level: 12th grade  Occupational History   Occupation: retired , day care  Tobacco Use   Smoking status: Never   Smokeless tobacco: Never  Substance and Sexual Activity   Alcohol use: Yes    Comment: rarely   Drug use: No   Sexual activity: Not on file  Other Topics Concern   Not on file  Social History Narrative   Household: pt and husband       2 g- children   2 g-g- children   Social Determinants of Health   Financial  Resource Strain: Low Risk  (11/15/2022)   Overall Financial Resource Strain (CARDIA)    Difficulty of Paying Living Expenses: Not hard at all  Food Insecurity: No Food Insecurity (11/15/2022)   Hunger Vital Sign    Worried About Running Out of Food in the Last Year: Never true    Ran Out of Food in the Last Year: Never true  Transportation Needs: No Transportation Needs (11/15/2022)   PRAPARE - Administrator, Civil Service (Medical): No    Lack of Transportation (Non-Medical): No  Physical Activity: Insufficiently Active (11/15/2022)   Exercise Vital Sign    Days of Exercise per Week: 1 day    Minutes of Exercise per Session: 10 min  Stress: No Stress Concern Present (11/15/2022)   [employer] of Occupational Health -  Occupational Stress Questionnaire    Feeling of Stress : Only a little  Social Connections: Moderately Integrated (11/15/2022)   Social Connection and Isolation Panel [NHANES]    Frequency of Communication with Friends and Family: Once a week    Frequency of Social Gatherings with Friends and Family: Once a week    Attends Religious Services: More than 4 times per year    Active Member of [employer] or Organizations: Yes    Attends Engineer, structural: More than 4 times per year    Marital Status: Married  Catering manager Violence: Not At Risk (12/13/2021)   Humiliation, Afraid, Rape, and Kick questionnaire    Fear of Current or Ex-Partner: No    Emotionally Abused: No    Physically Abused: No    Sexually Abused: No     Review of Systems: General: negative for chills, fever, night sweats or weight changes.  Cardiovascular: negative for chest pain, dyspnea on exertion, edema, orthopnea, palpitations, paroxysmal nocturnal dyspnea or shortness of breath Dermatological: negative for rash Respiratory: negative for cough or wheezing Urologic: negative for hematuria Abdominal: negative for nausea, vomiting, diarrhea, bright red blood per rectum, melena, or hematemesis Neurologic: negative for visual changes, syncope, or dizziness All other systems reviewed and are otherwise negative except as noted above.    Height 5\' 3"  (1.6 m), weight 111 lb 12.8 oz (50.7 kg).  General appearance: alert and no distress Neck: no adenopathy, no carotid bruit, no JVD, supple, symmetrical, trachea midline, and thyroid not enlarged, symmetric, no tenderness/mass/nodules Lungs: clear to auscultation bilaterally Heart: regular rate and rhythm, S1, S2 normal, no murmur, click, rub or gallop Extremities: extremities normal, atraumatic, no cyanosis or edema Pulses: 2+ and symmetric Skin: Skin color, texture, turgor normal. No rashes or lesions Neurologic: Grossly normal  EKG sinus rhythm at 70 without ST  or T wave changes.  I personally reviewed EKG  ASSESSMENT AND PLAN:   Hyperlipidemia History of hyperlipidemia on atorvastatin with lipid profile performed 05/26/2021 revealing total cholesterol 146, LDL 60 and HDL 69.  Coronary artery disease History of CAD by coronary CTA performed 08/15/2021 with a coronary calcium score of 196 and mild nonobstructive CAD.  Patient denies chest pain.  Paroxysmal atrial flutter (HCC) History of paroxysmal atrial flutter maintaining sinus rhythm on Eliquis oral anticoagulation.     Runell Gess MD FACP,FACC,FAHA, [facility] 11/28/2022 11:39 AM

## 2022-11-28 NOTE — Patient Instructions (Signed)
Medication Instructions:  Your physician recommends that you continue on your current medications as directed. Please refer to the Current Medication list given to you today.  *If you need a refill on your cardiac medications before your next appointment, please call your pharmacy*   Follow-Up: At [facility], you and your health needs are our priority.  As part of our continuing mission to provide you with exceptional heart care, we have created designated Provider Care Teams.  These Care Teams include your primary Cardiologist (physician) and Advanced Practice Providers (APPs -  Physician Assistants and Nurse Practitioners) who all work together to provide you with the care you need, when you need it.  We recommend signing up for the patient portal called "MyChart".  Sign up information is provided on this After Visit Summary.  MyChart is used to connect with patients for Virtual Visits (Telemedicine).  Patients are able to view lab/test results, encounter notes, upcoming appointments, etc.  Non-urgent messages can be sent to your provider as well.   To learn more about what you can do with MyChart, go to [email or web address].    Your next appointment:   12 month(s)  Provider:   Jonathan Berry, MD    

## 2022-11-28 NOTE — Assessment & Plan Note (Signed)
History of hyperlipidemia on atorvastatin with lipid profile performed 05/26/2021 revealing total cholesterol 146, LDL 60 and HDL 69.

## 2022-12-11 ENCOUNTER — Encounter (INDEPENDENT_AMBULATORY_CARE_PROVIDER_SITE_OTHER): Payer: Medicare Other | Admitting: Ophthalmology

## 2022-12-11 DIAGNOSIS — H43822 Vitreomacular adhesion, left eye: Secondary | ICD-10-CM

## 2022-12-11 DIAGNOSIS — H353131 Nonexudative age-related macular degeneration, bilateral, early dry stage: Secondary | ICD-10-CM

## 2022-12-11 DIAGNOSIS — H43813 Vitreous degeneration, bilateral: Secondary | ICD-10-CM

## 2022-12-11 DIAGNOSIS — H35342 Macular cyst, hole, or pseudohole, left eye: Secondary | ICD-10-CM | POA: Diagnosis not present

## 2022-12-19 ENCOUNTER — Ambulatory Visit (INDEPENDENT_AMBULATORY_CARE_PROVIDER_SITE_OTHER): Payer: Medicare Other | Admitting: *Deleted

## 2022-12-19 DIAGNOSIS — Z0000 Encounter for general adult medical examination without abnormal findings: Secondary | ICD-10-CM | POA: Diagnosis not present

## 2022-12-19 NOTE — Progress Notes (Signed)
Subjective:   Savannah Jimenez is a 87 y.o. female who presents for Medicare Annual (Subsequent) preventive examination.  I connected with  Pricilla Loveless on 12/19/22 by a audio enabled telemedicine application and verified that I am speaking with the correct person using two identifiers.  Patient Location: Home  Provider Location: Office/Clinic  I discussed the limitations of evaluation and management by telemedicine. The patient expressed understanding and agreed to proceed.   Review of Systems     Cardiac Risk Factors include: advanced age (>36men, >50 women);dyslipidemia     Objective:    There were no vitals filed for this visit. There is no height or weight on file to calculate BMI.     12/19/2022   10:21 AM 12/13/2021   10:25 AM 07/06/2021   11:00 AM 11/12/2020    9:32 AM 07/08/2020   10:26 AM 07/07/2019   10:05 AM 07/04/2018    9:09 AM  Advanced Directives  Does Patient Have a Medical Advance Directive? Yes Yes Yes No Yes Yes Yes  Type of Estate agent of [place];Living will Healthcare Power of [place];Out of facility DNR (pink MOST or yellow form);Living will Healthcare Power of [place];Living will  Healthcare Power of [place];Living will Healthcare Power of [place];Living will Healthcare Power of [place];Living will  Does patient want to make changes to medical advance directive?  No - Patient declined No - Patient declined   No - Patient declined   Copy of Healthcare Power of Attorney in Chart? Yes - validated most recent copy scanned in chart (See row information) Yes - validated most recent copy scanned in chart (See row information) No - copy requested  No - copy requested Yes - validated most recent copy scanned in chart (See row information) Yes - validated most recent copy scanned in chart (See row information)  Would patient like information on creating a medical advance directive?   No - Patient declined        Current Medications  (verified) Outpatient Encounter Medications as of 12/19/2022  Medication Sig   apixaban (ELIQUIS) 2.5 MG TABS tablet TAKE 1 TABLET BY MOUTH TWICE  DAILY   atorvastatin (LIPITOR) 20 MG tablet Take 1 tablet (20 mg total) by mouth at bedtime.   busPIRone (BUSPAR) 7.5 MG tablet Take 1 tablet (7.5 mg total) by mouth 2 (two) times daily.   Cholecalciferol (VITAMIN D) 50 MCG (2000 UT) CAPS Take by mouth.   Cyanocobalamin (B-12) 1000 MCG TABS Take by mouth.   fish oil-omega-3 fatty acids 1000 MG capsule Take 2 g by mouth daily.   fluticasone (FLONASE) 50 MCG/ACT nasal spray SHAKE WELL AND USE 2 SPRAYS IN EACH NOSTRIL DAILY   metoprolol succinate (TOPROL-XL) 25 MG 24 hr tablet TAKE ONE-HALF TABLET BY MOUTH  DAILY   Multiple Vitamins-Minerals (PRESERVISION AREDS 2) CAPS Take 1 capsule by mouth 2 (two) times daily.    zolpidem (AMBIEN) 5 MG tablet Take 1 tablet (5 mg total) by mouth at bedtime as needed.   No facility-administered encounter medications on file as of 12/19/2022.    Allergies (verified) Tetanus toxoids, Penicillins, Sulfacetamide sodium, and Sulfamethoxazole-trimethoprim   History: Past Medical History:  Diagnosis Date   Allergy    Arthritis    BCC (basal cell carcinoma of skin)    GERD (gastroesophageal reflux disease)    Glaucoma suspect    HOH (hard of hearing)    Osteoporosis    Past Surgical History:  Procedure Laterality Date   ABDOMINAL  HYSTERECTOMY     still have cervix   ARTERY BIOPSY Right 10/06/2014   Procedure: BIOPSY TEMPORAL ARTERY;  Surgeon: Darnell Level, MD;  Location: [facility];  Service: General;  Laterality: Right;   BREAST EXCISIONAL BIOPSY Right    over 20 years ago; lymph nodes removed   CATARACT EXTRACTION  10/2011   Left   colonscopy  2007   INNER EAR SURGERY  1974   both stapes replaced-steal   TUBAL LIGATION     Family History  Problem Relation Age of Onset   Heart failure Mother    Hypertension Sister    Asthma Sister     Hypertension Brother    Social History   Socioeconomic History   Marital status: Married    Spouse name: Not on file   Number of children: 2   Years of education: Not on file   Highest education level: 12th grade  Occupational History   Occupation: retired , day care  Tobacco Use   Smoking status: Never   Smokeless tobacco: Never  Substance and Sexual Activity   Alcohol use: Yes    Comment: rarely   Drug use: No   Sexual activity: Not on file  Other Topics Concern   Not on file  Social History Narrative   Household: pt and husband       2 g- children   2 g-g- children   Social Determinants of Health   Financial Resource Strain: Low Risk  (11/15/2022)   Overall Financial Resource Strain (CARDIA)    Difficulty of Paying Living Expenses: Not hard at all  Food Insecurity: No Food Insecurity (11/15/2022)   Hunger Vital Sign    Worried About Running Out of Food in the Last Year: Never true    Ran Out of Food in the Last Year: Never true  Transportation Needs: No Transportation Needs (11/15/2022)   PRAPARE - Administrator, Civil Service (Medical): No    Lack of Transportation (Non-Medical): No  Physical Activity: Insufficiently Active (11/15/2022)   Exercise Vital Sign    Days of Exercise per Week: 1 day    Minutes of Exercise per Session: 10 min  Stress: No Stress Concern Present (11/15/2022)   [employer] of Occupational Health - Occupational Stress Questionnaire    Feeling of Stress : Only a little  Social Connections: Moderately Integrated (11/15/2022)   Social Connection and Isolation Panel [NHANES]    Frequency of Communication with Friends and Family: Once a week    Frequency of Social Gatherings with Friends and Family: Once a week    Attends Religious Services: More than 4 times per year    Active Member of [employer] or Organizations: Yes    Attends Engineer, structural: More than 4 times per year    Marital Status: Married    Tobacco  Counseling Counseling given: Not Answered   Clinical Intake:  Pre-visit preparation completed: Yes  Pain : No/denies pain  Nutritional Risks: None Diabetes: No  How often do you need to have someone help you when you read instructions, pamphlets, or other written materials from your doctor or pharmacy?: 1 - Never   Activities of Daily Living    12/19/2022   10:23 AM  In your present state of health, do you have any difficulty performing the following activities:  Hearing? 1  Comment wears hearing aids  Vision? 0  Difficulty concentrating or making decisions? 0  Walking or climbing stairs? 0  Dressing  or bathing? 0  Doing errands, shopping? 0  Preparing Food and eating ? N  Using the Toilet? N  In the past six months, have you accidently leaked urine? Y  Do you have problems with loss of bowel control? N  Managing your Medications? N  Managing your Finances? N  Housekeeping or managing your Housekeeping? N    Patient Care Team: Wanda Plump, MD as PCP - General (Internal Medicine) Runell Gess, MD as PCP - Cardiology (Cardiology) Sherrie George, MD as Consulting Physician (Ophthalmology) Dimitri Ped, MD as Consulting Physician (Ophthalmology) Elmon Else, MD as Consulting Physician (Dermatology)  Indicate any recent Medical Services you may have received from other than [facility] providers in the past year (date may be approximate).     Assessment:   This is a routine wellness examination for Damiah.  Hearing/Vision screen No results found.  Dietary issues and exercise activities discussed: Current Exercise Habits: The patient does not participate in regular exercise at present, Exercise limited by: cardiac condition(s);respiratory conditions(s)   Goals Addressed   None    Depression Screen    12/19/2022   10:23 AM 11/22/2022   11:13 AM 05/26/2022    9:18 AM 12/13/2021   10:26 AM 06/15/2021    8:50 AM 07/08/2020   10:29 AM 07/07/2019   10:09 AM   PHQ 2/9 Scores  PHQ - 2 Score 0 0 0 0 0 0 0    Fall Risk    12/19/2022   10:22 AM 11/22/2022   11:13 AM 05/26/2022    9:18 AM 12/13/2021   10:25 AM 06/15/2021    8:50 AM  Fall Risk   Falls in the past year? 0 0 0 1 0  Number falls in past yr: 0 0 0 0 0  Injury with Fall? 0 0 0 1 0  Risk for fall due to : No Fall Risks   History of fall(s)   Follow up Falls evaluation completed Falls evaluation completed Falls evaluation completed Falls evaluation completed Falls evaluation completed    FALL RISK PREVENTION PERTAINING TO THE HOME:  Any stairs in or around the home? Yes  If so, are there any without handrails? No  Home free of loose throw rugs in walkways, pet beds, electrical cords, etc? Yes  Adequate lighting in your home to reduce risk of falls? Yes   ASSISTIVE DEVICES UTILIZED TO PREVENT FALLS:  Life alert? No  Use of a cane, walker or w/c? No  Grab bars in the bathroom? Yes  Shower chair or bench in shower?  Built in seat Elevated toilet seat or a handicapped toilet? Yes   TIMED UP AND GO:  Was the test performed?  No, audio visit .    Cognitive Function:        12/19/2022   10:27 AM 12/13/2021   10:30 AM 07/08/2020   10:38 AM  6CIT Screen  What Year? 0 points 0 points 0 points  What month? 0 points 0 points 0 points  What time? 0 points 0 points 0 points  Count back from 20 0 points 0 points 0 points  Months in reverse 0 points 0 points 0 points  Repeat phrase 0 points 0 points   Total Score 0 points 0 points     Immunizations Immunization History  Administered Date(s) Administered   Fluad Quad(high Dose 65+) 05/26/2021   Influenza Split 04/26/2022   Influenza Whole 04/05/2010   Influenza, High Dose Seasonal PF 04/30/2014, 05/04/2015, 04/25/2016,  04/11/2019   Influenza-Unspecified 05/01/2017, 04/25/2018, 04/23/2020   PFIZER(Purple Top)SARS-COV-2 Vaccination 08/13/2019, 09/01/2019, 06/04/2020   Pneumococcal Conjugate-13 06/03/2014   Pneumococcal  Polysaccharide-23 04/19/2009, 01/29/2019   Zoster Recombinat (Shingrix) 08/25/2020, 11/09/2020   Zoster, Live 11/04/2007, 12/24/2009    TDAP status: Due, Education has been provided regarding the importance of this vaccine. Advised may receive this vaccine at local pharmacy or Health Dept. Aware to provide a copy of the vaccination record if obtained from local pharmacy or Health Dept. Verbalized acceptance and understanding.  Flu Vaccine status: Up to date  Pneumococcal vaccine status: Up to date  Covid-19 vaccine status: Information provided on how to obtain vaccines.   Qualifies for Shingles Vaccine? Yes   Zostavax completed Yes   Shingrix Completed?: Yes  Screening Tests Health Maintenance  Topic Date Due   DTaP/Tdap/Td (1 - Tdap) Never done   COVID-19 Vaccine (4 - 2023-24 season) 03/24/2022   Medicare Annual Wellness (AWV)  12/14/2022   INFLUENZA VACCINE  02/22/2023   Pneumonia Vaccine 9+ Years old  Completed   DEXA SCAN  Completed   Zoster Vaccines- Shingrix  Completed   HPV VACCINES  Aged Out    Health Maintenance  Health Maintenance Due  Topic Date Due   DTaP/Tdap/Td (1 - Tdap) Never done   COVID-19 Vaccine (4 - 2023-24 season) 03/24/2022   Medicare Annual Wellness (AWV)  12/14/2022    Colorectal cancer screening: No longer required.   Mammogram status: Completed 01/02/22. Repeat every year  Bone Density status: Completed 01/02/22. Results reflect: Bone density results: OSTEOPENIA. Repeat every 2 years.  Lung Cancer Screening: (Low Dose CT Chest recommended if Age 36-80 years, 30 pack-year currently smoking OR have quit w/in 15years.) does not qualify.   Additional Screening:  Hepatitis C Screening: does not qualify  Vision Screening: Recommended annual ophthalmology exams for early detection of glaucoma and other disorders of the eye. Is the patient up to date with their annual eye exam?  Yes  Who is the provider or what is the name of the office in which  the patient attends annual eye exams? Dr. Zenaida Niece If pt is not established with a provider, would they like to be referred to a provider to establish care? No .   Dental Screening: Recommended annual dental exams for proper oral hygiene  Community Resource Referral / Chronic Care Management: CRR required this visit?  No   CCM required this visit?  No      Plan:     I have personally reviewed and noted the following in the patient's chart:   Medical and social history Use of alcohol, tobacco or illicit drugs  Current medications and supplements including opioid prescriptions. Patient is not currently taking opioid prescriptions. Functional ability and status Nutritional status Physical activity Advanced directives List of other physicians Hospitalizations, surgeries, and ER visits in previous 12 months Vitals Screenings to include cognitive, depression, and falls Referrals and appointments  In addition, I have reviewed and discussed with patient certain preventive protocols, quality metrics, and best practice recommendations. A written personalized care plan for preventive services as well as general preventive health recommendations were provided to patient.   Due to this being a telephonic visit, the after visit summary with patients personalized plan was offered to patient via mail or my-chart.  Patient would like to access on my-chart.  Donne Anon, [place]   12/19/2022   Nurse Notes: None

## 2022-12-19 NOTE — Patient Instructions (Signed)
Savannah Jimenez , Thank you for taking time to come for your Medicare Wellness Visit. I appreciate your ongoing commitment to your health goals. Please review the following plan we discussed and let me know if I can assist you in the future.     This is a list of the screening recommended for you and due dates:  Health Maintenance  Topic Date Due   DTaP/Tdap/Td vaccine (1 - Tdap) Never done   COVID-19 Vaccine (4 - 2023-24 season) 03/24/2022   Flu Shot  02/22/2023   Medicare Annual Wellness Visit  12/19/2023   Pneumonia Vaccine  Completed   DEXA scan (bone density measurement)  Completed   Zoster (Shingles) Vaccine  Completed   HPV Vaccine  Aged Out   Next appointment: Follow up in one year for your annual wellness visit.   Preventive Care 85 Years and Older, Female Preventive care refers to lifestyle choices and visits with your health care provider that can promote health and wellness. What does preventive care include? A yearly physical exam. This is also called an annual well check. Dental exams once or twice a year. Routine eye exams. Ask your health care provider how often you should have your eyes checked. Personal lifestyle choices, including: Daily care of your teeth and gums. Regular physical activity. Eating a healthy diet. Avoiding tobacco and drug use. Limiting alcohol use. Practicing safe sex. Taking low-dose aspirin every day. Taking vitamin and mineral supplements as recommended by your health care provider. What happens during an annual well check? The services and screenings done by your health care provider during your annual well check will depend on your age, overall health, lifestyle risk factors, and family history of disease. Counseling  Your health care provider may ask you questions about your: Alcohol use. Tobacco use. Drug use. Emotional well-being. Home and relationship well-being. Sexual activity. Eating habits. History of falls. Memory and  ability to understand (cognition). Work and work Astronomer. Reproductive health. Screening  You may have the following tests or measurements: Height, weight, and BMI. Blood pressure. Lipid and cholesterol levels. These may be checked every 5 years, or more frequently if you are over 85 years old. Skin check. Lung cancer screening. You may have this screening every year starting at age 65 if you have a 30-pack-year history of smoking and currently smoke or have quit within the past 15 years. Fecal occult blood test (FOBT) of the stool. You may have this test every year starting at age 85. Flexible sigmoidoscopy or colonoscopy. You may have a sigmoidoscopy every 5 years or a colonoscopy every 10 years starting at age 85. Hepatitis C blood test. Hepatitis B blood test. Sexually transmitted disease (STD) testing. Diabetes screening. This is done by checking your blood sugar (glucose) after you have not eaten for a while (fasting). You may have this done every 1-3 years. Bone density scan. This is done to screen for osteoporosis. You may have this done starting at age 85. Mammogram. This may be done every 1-2 years. Talk to your health care provider about how often you should have regular mammograms. Talk with your health care provider about your test results, treatment options, and if necessary, the need for more tests. Vaccines  Your health care provider may recommend certain vaccines, such as: Influenza vaccine. This is recommended every year. Tetanus, diphtheria, and acellular pertussis (Tdap, Td) vaccine. You may need a Td booster every 10 years. Zoster vaccine. You may need this after age 85. Pneumococcal 13-valent conjugate (  PCV13) vaccine. One dose is recommended after age 85. Pneumococcal polysaccharide (PPSV23) vaccine. One dose is recommended after age 85. Talk to your health care provider about which screenings and vaccines you need and how often you need them. This information is  not intended to replace advice given to you by your health care provider. Make sure you discuss any questions you have with your health care provider. Document Released: 08/06/2015 Document Revised: 03/29/2016 Document Reviewed: 05/11/2015 Elsevier Interactive Patient Education  2017 [employer].  Fall Prevention in the Home Falls can cause injuries. They can happen to people of all ages. There are many things you can do to make your home safe and to help prevent falls. What can I do on the outside of my home? Regularly fix the edges of walkways and driveways and fix any cracks. Remove anything that might make you trip as you walk through a door, such as a raised step or threshold. Trim any bushes or trees on the path to your home. Use bright outdoor lighting. Clear any walking paths of anything that might make someone trip, such as rocks or tools. Regularly check to see if handrails are loose or broken. Make sure that both sides of any steps have handrails. Any raised decks and porches should have guardrails on the edges. Have any leaves, snow, or ice cleared regularly. Use sand or salt on walking paths during winter. Clean up any spills in your garage right away. This includes oil or grease spills. What can I do in the bathroom? Use night lights. Install grab bars by the toilet and in the tub and shower. Do not use towel bars as grab bars. Use non-skid mats or decals in the tub or shower. If you need to sit down in the shower, use a plastic, non-slip stool. Keep the floor dry. Clean up any water that spills on the floor as soon as it happens. Remove soap buildup in the tub or shower regularly. Attach bath mats securely with double-sided non-slip rug tape. Do not have throw rugs and other things on the floor that can make you trip. What can I do in the bedroom? Use night lights. Make sure that you have a light by your bed that is easy to reach. Do not use any sheets or blankets that  are too big for your bed. They should not hang down onto the floor. Have a firm chair that has side arms. You can use this for support while you get dressed. Do not have throw rugs and other things on the floor that can make you trip. What can I do in the kitchen? Clean up any spills right away. Avoid walking on wet floors. Keep items that you use a lot in easy-to-reach places. If you need to reach something above you, use a strong step stool that has a grab bar. Keep electrical cords out of the way. Do not use floor polish or wax that makes floors slippery. If you must use wax, use non-skid floor wax. Do not have throw rugs and other things on the floor that can make you trip. What can I do with my stairs? Do not leave any items on the stairs. Make sure that there are handrails on both sides of the stairs and use them. Fix handrails that are broken or loose. Make sure that handrails are as long as the stairways. Check any carpeting to make sure that it is firmly attached to the stairs. Fix any carpet that is  loose or worn. Avoid having throw rugs at the top or bottom of the stairs. If you do have throw rugs, attach them to the floor with carpet tape. Make sure that you have a light switch at the top of the stairs and the bottom of the stairs. If you do not have them, ask someone to add them for you. What else can I do to help prevent falls? Wear shoes that: Do not have high heels. Have rubber bottoms. Are comfortable and fit you well. Are closed at the toe. Do not wear sandals. If you use a stepladder: Make sure that it is fully opened. Do not climb a closed stepladder. Make sure that both sides of the stepladder are locked into place. Ask someone to hold it for you, if possible. Clearly mark and make sure that you can see: Any grab bars or handrails. First and last steps. Where the edge of each step is. Use tools that help you move around (mobility aids) if they are needed. These  include: Canes. Walkers. Scooters. Crutches. Turn on the lights when you go into a dark area. Replace any light bulbs as soon as they burn out. Set up your furniture so you have a clear path. Avoid moving your furniture around. If any of your floors are uneven, fix them. If there are any pets around you, be aware of where they are. Review your medicines with your doctor. Some medicines can make you feel dizzy. This can increase your chance of falling. Ask your doctor what other things that you can do to help prevent falls. This information is not intended to replace advice given to you by your health care provider. Make sure you discuss any questions you have with your health care provider. Document Released: 05/06/2009 Document Revised: 12/16/2015 Document Reviewed: 08/14/2014 Elsevier Interactive Patient Education  2017 [employer].

## 2023-01-08 ENCOUNTER — Ambulatory Visit (HOSPITAL_BASED_OUTPATIENT_CLINIC_OR_DEPARTMENT_OTHER)
Admission: RE | Admit: 2023-01-08 | Discharge: 2023-01-08 | Disposition: A | Discharge status: Home / Self Care | Payer: Medicare Other | Source: Ambulatory Visit | Attending: Internal Medicine | Admitting: Internal Medicine

## 2023-01-08 ENCOUNTER — Encounter (HOSPITAL_BASED_OUTPATIENT_CLINIC_OR_DEPARTMENT_OTHER): Payer: Self-pay

## 2023-01-08 DIAGNOSIS — Z1231 Encounter for screening mammogram for malignant neoplasm of breast: Secondary | ICD-10-CM | POA: Diagnosis present

## 2023-01-20 ENCOUNTER — Other Ambulatory Visit: Payer: Self-pay | Admitting: Cardiovascular Disease

## 2023-01-22 NOTE — Telephone Encounter (Signed)
Prescription refill request for Eliquis received. Indication:aflutter Last office visit:5/24 Scr:0.7 11/23 Age: 87 Weight:50.7  kg  Prescription refilled

## 2023-01-26 ENCOUNTER — Other Ambulatory Visit: Payer: Self-pay | Admitting: Internal Medicine

## 2023-02-06 ENCOUNTER — Other Ambulatory Visit: Payer: Self-pay | Admitting: Internal Medicine

## 2023-03-08 ENCOUNTER — Encounter (INDEPENDENT_AMBULATORY_CARE_PROVIDER_SITE_OTHER): Payer: Self-pay

## 2023-03-28 ENCOUNTER — Ambulatory Visit (INDEPENDENT_AMBULATORY_CARE_PROVIDER_SITE_OTHER): Payer: Medicare Other | Admitting: Internal Medicine

## 2023-03-28 ENCOUNTER — Encounter: Payer: Self-pay | Admitting: Internal Medicine

## 2023-03-28 VITALS — BP 128/84 | HR 70 | Temp 98.4°F | Resp 16 | Ht 63.0 in | Wt 111.5 lb

## 2023-03-28 DIAGNOSIS — F419 Anxiety disorder, unspecified: Secondary | ICD-10-CM | POA: Diagnosis not present

## 2023-03-28 DIAGNOSIS — Z0001 Encounter for general adult medical examination with abnormal findings: Secondary | ICD-10-CM

## 2023-03-28 DIAGNOSIS — I4892 Unspecified atrial flutter: Secondary | ICD-10-CM | POA: Diagnosis not present

## 2023-03-28 DIAGNOSIS — Z0000 Encounter for general adult medical examination without abnormal findings: Secondary | ICD-10-CM

## 2023-03-28 DIAGNOSIS — M810 Age-related osteoporosis without current pathological fracture: Secondary | ICD-10-CM

## 2023-03-28 DIAGNOSIS — E782 Mixed hyperlipidemia: Secondary | ICD-10-CM | POA: Diagnosis not present

## 2023-03-28 LAB — LIPID PANEL
Cholesterol: 145 mg/dL (ref 0–200)
HDL: 56.4 mg/dL (ref >39.00)
LDL Cholesterol: 68 mg/dL (ref 0–99)
NonHDL: 88.34
Total CHOL/HDL Ratio: 3
Triglycerides: 100 mg/dL (ref 0.0–149.0)
VLDL: 20 mg/dL (ref 0.0–40.0)

## 2023-03-28 LAB — CBC WITH DIFFERENTIAL/PLATELET
Basophils Absolute: 0 K/uL (ref 0.0–0.1)
Basophils Relative: 0.4 % (ref 0.0–3.0)
Eosinophils Absolute: 0 K/uL (ref 0.0–0.7)
Eosinophils Relative: 0.4 % (ref 0.0–5.0)
HCT: 40.4 % (ref 36.0–46.0)
Hemoglobin: 13 g/dL (ref 12.0–15.0)
Lymphocytes Relative: 24.7 % (ref 12.0–46.0)
Lymphs Abs: 1.1 K/uL (ref 0.7–4.0)
MCHC: 32.1 g/dL (ref 30.0–36.0)
MCV: 96.8 fl (ref 78.0–100.0)
Monocytes Absolute: 0.3 K/uL (ref 0.1–1.0)
Monocytes Relative: 7.3 % (ref 3.0–12.0)
Neutro Abs: 3.1 K/uL (ref 1.4–7.7)
Neutrophils Relative %: 67.2 % (ref 43.0–77.0)
Platelets: 214 K/uL (ref 150.0–400.0)
RBC: 4.18 Mil/uL (ref 3.87–5.11)
RDW: 13.8 % (ref 11.5–15.5)
WBC: 4.6 K/uL (ref 4.0–10.5)

## 2023-03-28 LAB — COMPREHENSIVE METABOLIC PANEL WITH GFR
ALT: 22 U/L (ref 0–35)
AST: 26 U/L (ref 0–37)
Albumin: 4 g/dL (ref 3.5–5.2)
Alkaline Phosphatase: 61 U/L (ref 39–117)
BUN: 14 mg/dL (ref 6–23)
CO2: 31 meq/L (ref 19–32)
Calcium: 9.4 mg/dL (ref 8.4–10.5)
Chloride: 101 meq/L (ref 96–112)
Creatinine, Ser: 0.63 mg/dL (ref 0.40–1.20)
GFR: 81.28 mL/min
Glucose, Bld: 88 mg/dL (ref 70–99)
Potassium: 4.2 meq/L (ref 3.5–5.1)
Sodium: 140 meq/L (ref 135–145)
Total Bilirubin: 1 mg/dL (ref 0.2–1.2)
Total Protein: 6.7 g/dL (ref 6.0–8.3)

## 2023-03-28 NOTE — Progress Notes (Signed)
Subjective:    Patient ID: Savannah Jimenez, female    DOB: Apr 29, 1936, 87 y.o.   MRN: [id number]  DOS:  03/28/2023 Type of visit - description: CPX  Since the last office visit is doing well. Has chronic DOE at baseline.  Denies cough or wheezing. No chest pain.  No other concerns.   Review of Systems  Other than above, a 14 point review of systems is negative      Past Medical History:  Diagnosis Date   Allergy    Arthritis    BCC (basal cell carcinoma of skin)    GERD (gastroesophageal reflux disease)    Glaucoma suspect    HOH (hard of hearing)    Osteoporosis     Past Surgical History:  Procedure Laterality Date   ABDOMINAL HYSTERECTOMY     still have cervix   ARTERY BIOPSY Right 10/06/2014   Procedure: BIOPSY TEMPORAL ARTERY;  Surgeon: Darnell Level, MD;  Location: [facility];  Service: General;  Laterality: Right;   BREAST EXCISIONAL BIOPSY Right    over 20 years ago; lymph nodes removed   CATARACT EXTRACTION  10/2011   Left   colonscopy  2007   INNER EAR SURGERY  1974   both stapes replaced-steal   TUBAL LIGATION     Social History   Socioeconomic History   Marital status: Married    Spouse name: Not on file   Number of children: 2   Years of education: Not on file   Highest education level: 12th grade  Occupational History   Occupation: retired , day care  Tobacco Use   Smoking status: Never   Smokeless tobacco: Never  Substance and Sexual Activity   Alcohol use: Yes    Comment: rarely   Drug use: No   Sexual activity: Not on file  Other Topics Concern   Not on file  Social History Narrative   Household: pt and husband       2 g- children   2 g-g- children   Social Determinants of Health   Financial Resource Strain: Low Risk  (11/15/2022)   Overall Financial Resource Strain (CARDIA)    Difficulty of Paying Living Expenses: Not hard at all  Food Insecurity: No Food Insecurity (11/15/2022)   Hunger Vital Sign    Worried About  Running Out of Food in the Last Year: Never true    Ran Out of Food in the Last Year: Never true  Transportation Needs: No Transportation Needs (11/15/2022)   PRAPARE - Administrator, Civil Service (Medical): No    Lack of Transportation (Non-Medical): No  Physical Activity: Insufficiently Active (11/15/2022)   Exercise Vital Sign    Days of Exercise per Week: 1 day    Minutes of Exercise per Session: 10 min  Stress: No Stress Concern Present (11/15/2022)   [employer] of Occupational Health - Occupational Stress Questionnaire    Feeling of Stress : Only a little  Social Connections: Moderately Integrated (11/15/2022)   Social Connection and Isolation Panel [NHANES]    Frequency of Communication with Friends and Family: Once a week    Frequency of Social Gatherings with Friends and Family: Once a week    Attends Religious Services: More than 4 times per year    Active Member of [employer] or Organizations: Yes    Attends Engineer, structural: More than 4 times per year    Marital Status: Married  Catering manager Violence: Not At  Risk (12/19/2022)   Humiliation, Afraid, Rape, and Kick questionnaire    Fear of Current or Ex-Partner: No    Emotionally Abused: No    Physically Abused: No    Sexually Abused: No     Current Outpatient Medications  Medication Instructions   atorvastatin (LIPITOR) 20 mg, Oral, Daily at bedtime   busPIRone (BUSPAR) 7.5 mg, Oral, 2 times daily   Cholecalciferol (VITAMIN D) 50 MCG (2000 UT) CAPS Oral   Cyanocobalamin (B-12) 1000 MCG TABS Oral   ELIQUIS 2.5 MG TABS tablet TAKE 1 TABLET BY MOUTH TWICE  DAILY   fish oil-omega-3 fatty acids 2 g, Oral, Daily,     fluticasone (FLONASE) 50 MCG/ACT nasal spray SHAKE WELL AND USE 2 SPRAYS IN EACH NOSTRIL DAILY   metoprolol succinate (TOPROL-XL) 12.5 mg, Oral, Daily   Multiple Vitamins-Minerals (PRESERVISION AREDS 2) CAPS 1 capsule, Oral, 2 times daily   zolpidem (AMBIEN) 5 mg, Oral, At bedtime  PRN       Objective:   Physical Exam BP 128/84   Pulse 70   Temp 98.4 F (36.9 C) (Oral)   Resp 16   Ht 5\' 3"  (1.6 m)   Wt 111 lb 8 oz (50.6 kg)   SpO2 98%   BMI 19.75 kg/m  General: Well developed, NAD, BMI noted Neck: No  thyromegaly  HEENT:  Normocephalic . Face symmetric, atraumatic Lungs:  CTA B Normal respiratory effort, no intercostal retractions, no accessory muscle use. Heart: RRR,  no murmur.  Abdomen:  Not distended, soft, non-tender. No rebound or rigidity.   Lower extremities: no pretibial edema bilaterally  Skin: Exposed areas without rash. Not pale. Not jaundice Neurologic:  alert & oriented X3.  Speech normal, gait appropriate for age and unassisted Strength symmetric and appropriate for age.  Psych: Cognition and judgment appear intact.  Cooperative with normal attention span and concentration.  Behavior appropriate. No anxious or depressed appearing.     Assessment    Assessment, transferring from Dr. Clifton Custard (01/2018)  Allergies GERD Anxiety depression Atrial fibrillation/flutter Dx 2022 TIA: Dx 2022. Osteoporosis: - rx  actonel before  -Tscore  -2.2 (10-2017), -2.3 (June 2023), declines treatment as of 03/28/2023.  Aware of risks. Palpable abdominal aorta: Abdominal ultrasound  09/2018 negative for AAA B12 deficiency Pulmonary nodule - sees  pulmonary H/o polymyalgia rheumatica; used to see Dr Kellie Simmering, [facility] [place]. Bx 2016 Ear implants: No MRI Pectus excavatum-- chronic DOE   PLAN Here for CPX Td: allergic PNM 13:  2015.  PNM 23:  2010 and 01-2019, will need PNM 20 on 2025 S/p  Zostavax & shingrix. Had a RSV (per pt) Vaccine advice: new COVID-vaccine booster, flu shot every fall  CCS: Last  colonoscopy 2007, negative Cologuard 06/2017, negative Cologuard 2022.Marland Kitchen Last mammogram 12/2022 per [facility]. No further Pap smears, had a partial hysterectomy, still has a cervix.  Labs: CMP [facility] POA: On file Lifestyle: Doing well, trying to  stay active within her house, walking, stretching, doing some lifting. CAD, paroxysmal A-fib: 11/28/2022, saw cardiology, felt to be stable, no changes made.  On anticoagulants Check a CMP, CBC. High cholesterol: Continue atorvastatin.  Checking labs. Anxiety: On BuSpar.  Seems controlled. Osteoporosis: Again declines treatment, she is very aware of risk of falls and severe consequences such as hip fractures.  Last vitamin D level satisfactory.  Preventive fall discussed RTC 6 months.

## 2023-03-28 NOTE — Assessment & Plan Note (Signed)
Here for CPX CAD, paroxysmal A-fib: 11/28/2022, saw cardiology, felt to be stable, no changes made.  On anticoagulants Check a CMP, CBC. High cholesterol: Continue atorvastatin.  Checking labs. Anxiety: On BuSpar.  Seems controlled. Osteoporosis: Again declines treatment, she is very aware of risk of falls and severe consequences such as hip fractures.  Last vitamin D level satisfactory.  Preventive fall discussed RTC 6 months.

## 2023-03-28 NOTE — Assessment & Plan Note (Signed)
Here for CPX Td: allergic PNM 13:  2015.  PNM 23:  2010 and 01-2019, will need PNM 20 on 2025 S/p  Zostavax & shingrix. Had a RSV (per pt) Vaccine advice: new COVID-vaccine booster, flu shot every fall  CCS: Last  colonoscopy 2007, negative Cologuard 06/2017, negative Cologuard 2022.Marland Kitchen Last mammogram 12/2022 per [facility]. No further Pap smears, had a partial hysterectomy, still has a cervix.  Labs: CMP FLP [facility] POA: On file Lifestyle: Doing well, trying to stay active within her house, walking, stretching, doing some lifting.

## 2023-03-28 NOTE — Patient Instructions (Addendum)
Vaccines I recommend: Covid booster-new this fall Flu shot this fall  Read the information about fall prevention as below    GO TO THE LAB : Get the blood work     GO TO THE FRONT DESK, PLEASE SCHEDULE YOUR APPOINTMENTS Come back for   checkup in 6 months   Fall Prevention in the Home, Adult Falls can cause injuries and affect people of all ages. There are many simple things that you can do to make your home safe and to help prevent falls. If you need it, ask for help making these changes. What actions can I take to prevent falls? General information Use good lighting in all rooms. Make sure to: Replace any light bulbs that burn out. Turn on lights if it is dark and use night-lights. Keep items that you use often in easy-to-reach places. Lower the shelves around your home if needed. Move furniture so that there are clear paths around it. Do not keep throw rugs or other things on the floor that can make you trip. If any of your floors are uneven, fix them. Add color or contrast paint or tape to clearly mark and help you see: Grab bars or handrails. First and last steps of staircases. Where the edge of each step is. If you use a ladder or stepladder: Make sure that it is fully opened. Do not climb a closed ladder. Make sure the sides of the ladder are locked in place. Have someone hold the ladder while you use it. Know where your pets are as you move through your home. What can I do in the bathroom?     Keep the floor dry. Clean up any water that is on the floor right away. Remove soap buildup in the bathtub or shower. Buildup makes bathtubs and showers slippery. Use non-skid mats or decals on the floor of the bathtub or shower. Attach bath mats securely with double-sided, non-slip rug tape. If you need to sit down while you are in the shower, use a non-slip stool. Install grab bars by the toilet and in the bathtub and shower. Do not use towel bars as grab bars. What can I  do in the bedroom? Make sure that you have a light by your bed that is easy to reach. Do not use any sheets or blankets on your bed that hang to the floor. Have a firm bench or chair with side arms that you can use for support when you get dressed. What can I do in the kitchen? Clean up any spills right away. If you need to reach something above you, use a sturdy step stool that has a grab bar. Keep electrical cables out of the way. Do not use floor polish or wax that makes floors slippery. What can I do with my stairs? Do not leave anything on the stairs. Make sure that you have a light switch at the top and the bottom of the stairs. Have them installed if you do not have them. Make sure that there are handrails on both sides of the stairs. Fix handrails that are broken or loose. Make sure that handrails are as long as the staircases. Install non-slip stair treads on all stairs in your home if they do not have carpet. Avoid having throw rugs at the top or bottom of stairs, or secure the rugs with carpet tape to prevent them from moving. Choose a carpet design that does not hide the edge of steps on the stairs. Make sure  that carpet is firmly attached to the stairs. Fix any carpet that is loose or worn. What can I do on the outside of my home? Use bright outdoor lighting. Repair the edges of walkways and driveways and fix any cracks. Clear paths of anything that can make you trip, such as tools or rocks. Add color or contrast paint or tape to clearly mark and help you see high doorway thresholds. Trim any bushes or trees on the main path into your home. Check that handrails are securely fastened and in good repair. Both sides of all steps should have handrails. Install guardrails along the edges of any raised decks or porches. Have leaves, snow, and ice cleared regularly. Use sand, salt, or ice melt on walkways during winter months if you live where there is ice and snow. In the garage, clean  up any spills right away, including grease or oil spills. What other actions can I take? Review your medicines with your health care provider. Some medicines can make you confused or feel dizzy. This can increase your chance of falling. Wear closed-toe shoes that fit well and support your feet. Wear shoes that have rubber soles and low heels. Use a cane, walker, scooter, or crutches that help you move around if needed. Talk with your provider about other ways that you can decrease your risk of falls. This may include seeing a physical therapist to learn to do exercises to improve movement and strength. Where to find more information Centers for Disease Control and Prevention, STEADI: TonerPromos.no [employer] on Aging: BaseRingTones.pl National Institute on Aging: BaseRingTones.pl Contact a health care provider if: You are afraid of falling at home. You feel weak, drowsy, or dizzy at home. You fall at home. Get help right away if you: Lose consciousness or have trouble moving after a fall. Have a fall that causes a head injury. These symptoms may be an emergency. Get help right away. Call 911. Do not wait to see if the symptoms will go away. Do not drive yourself to the hospital. This information is not intended to replace advice given to you by your health care provider. Make sure you discuss any questions you have with your health care provider. Document Revised: 03/13/2022 Document Reviewed: 03/13/2022 Elsevier Patient Education  2024 [employer].

## 2023-05-01 ENCOUNTER — Ambulatory Visit (INDEPENDENT_AMBULATORY_CARE_PROVIDER_SITE_OTHER): Payer: Medicare Other | Admitting: Physician Assistant

## 2023-05-01 ENCOUNTER — Encounter: Payer: Self-pay | Admitting: Physician Assistant

## 2023-05-01 VITALS — BP 124/80 | HR 81 | Temp 97.7°F | Ht 63.0 in | Wt 110.2 lb

## 2023-05-01 DIAGNOSIS — S41112A Laceration without foreign body of left upper arm, initial encounter: Secondary | ICD-10-CM | POA: Diagnosis not present

## 2023-05-01 NOTE — Progress Notes (Signed)
Established patient visit   Patient: Savannah Jimenez   DOB: August 07, 1935   87 y.o. Female  MRN: [id number] Visit Date: 05/01/2023  Today's healthcare provider: Alfredia Ferguson, PA-C   Chief Complaint  Patient presents with   Rash    Left arm small skin lesion. States she got the flu shot on Friday and took her bandaid off the next day it was there.  Patient did states she also had a reaction to epi.   Subjective    Rash Pertinent negatives include no cough, fatigue, fever or shortness of breath.   Pt reports a small skin tear where her flu shot was given Friday 10/4 and wants to ensure this isn't a reaction. Denies itching.  Medications: Outpatient Medications Prior to Visit  Medication Sig   atorvastatin (LIPITOR) 20 MG tablet Take 1 tablet (20 mg total) by mouth at bedtime.   busPIRone (BUSPAR) 7.5 MG tablet Take 1 tablet (7.5 mg total) by mouth 2 (two) times daily.   Cholecalciferol (VITAMIN D) 50 MCG (2000 UT) CAPS Take by mouth.   Cyanocobalamin (B-12) 1000 MCG TABS Take by mouth.   ELIQUIS 2.5 MG TABS tablet TAKE 1 TABLET BY MOUTH TWICE  DAILY   fish oil-omega-3 fatty acids 1000 MG capsule Take 2 g by mouth daily.   fluticasone (FLONASE) 50 MCG/ACT nasal spray SHAKE WELL AND USE 2 SPRAYS IN EACH NOSTRIL DAILY   metoprolol succinate (TOPROL-XL) 25 MG 24 hr tablet TAKE ONE-HALF TABLET BY MOUTH  DAILY   Multiple Vitamins-Minerals (PRESERVISION AREDS 2) CAPS Take 1 capsule by mouth 2 (two) times daily.    zolpidem (AMBIEN) 5 MG tablet Take 1 tablet (5 mg total) by mouth at bedtime as needed.   No facility-administered medications prior to visit.    Review of Systems  Constitutional:  Negative for fatigue and fever.  Respiratory:  Negative for cough and shortness of breath.   Cardiovascular:  Negative for chest pain and leg swelling.  Gastrointestinal:  Negative for abdominal pain.  Skin:  Positive for rash.  Neurological:  Negative for dizziness and headaches.       Objective    BP 124/80 (BP Location: Left Arm, Patient Position: Sitting, Cuff Size: Normal)   Pulse 81   Temp 97.7 F (36.5 C) (Oral)   Ht 5\' 3"  (1.6 m)   Wt 110 lb 3.2 oz (50 kg)   SpO2 93%   BMI 19.52 kg/m   Physical Exam Vitals reviewed.  Constitutional:      Appearance: She is not ill-appearing.  HENT:     Head: Normocephalic.  Eyes:     Conjunctiva/sclera: Conjunctivae normal.  Cardiovascular:     Rate and Rhythm: Normal rate.  Pulmonary:     Effort: Pulmonary effort is normal. No respiratory distress.  Skin:    Comments: Left delt, medial, with a 5 mm skin tear. Next to this, lateral delt with bruising.  Neurological:     General: No focal deficit present.     Mental Status: She is alert and oriented to person, place, and time.  Psychiatric:        Mood and Affect: Mood normal.        Behavior: Behavior normal.      No results found for any visits on 05/01/23.  Assessment & Plan     1. Skin tear of left upper arm without complication, initial encounter Likely from the bandage rather than the shot. Presumably where the bruising is  laterally, is where the vaccine was administered. Reassured, advised to use vaseline/aquaphor and keep covered.  Return if symptoms worsen or fail to improve.      I, Alfredia Ferguson, PA-C have reviewed all documentation for this visit. The documentation on  05/01/23   for the exam, diagnosis, procedures, and orders are all accurate and complete.    Alfredia Ferguson, PA-C  [facility] [phone] (phone) [phone] (fax)  [facility]

## 2023-05-02 ENCOUNTER — Telehealth: Payer: Self-pay | Admitting: Internal Medicine

## 2023-05-02 NOTE — Telephone Encounter (Signed)
PDMP okay, next sent

## 2023-05-02 NOTE — Telephone Encounter (Signed)
Requesting: Ambien 5mg   Contract: 12/05/22 UDS: Ambien only Last Visit: 03/28/23 Next Visit:  09/26/23 Last Refill: 11/22/22 #90 and 0RF   Please Advise

## 2023-05-07 ENCOUNTER — Other Ambulatory Visit: Payer: Self-pay | Admitting: Internal Medicine

## 2023-05-27 ENCOUNTER — Other Ambulatory Visit: Payer: Self-pay | Admitting: Cardiovascular Disease

## 2023-07-29 ENCOUNTER — Emergency Department (HOSPITAL_BASED_OUTPATIENT_CLINIC_OR_DEPARTMENT_OTHER)
Admission: EM | Admit: 2023-07-29 | Discharge: 2023-07-29 | Disposition: A | Discharge status: Home / Self Care | Payer: Medicare Other | Attending: Emergency Medicine | Admitting: Emergency Medicine

## 2023-07-29 ENCOUNTER — Encounter (HOSPITAL_BASED_OUTPATIENT_CLINIC_OR_DEPARTMENT_OTHER): Payer: Self-pay

## 2023-07-29 ENCOUNTER — Emergency Department (HOSPITAL_BASED_OUTPATIENT_CLINIC_OR_DEPARTMENT_OTHER): Payer: Medicare Other

## 2023-07-29 ENCOUNTER — Other Ambulatory Visit: Payer: Self-pay

## 2023-07-29 DIAGNOSIS — R627 Adult failure to thrive: Secondary | ICD-10-CM | POA: Insufficient documentation

## 2023-07-29 DIAGNOSIS — I517 Cardiomegaly: Secondary | ICD-10-CM | POA: Diagnosis not present

## 2023-07-29 DIAGNOSIS — R509 Fever, unspecified: Secondary | ICD-10-CM | POA: Insufficient documentation

## 2023-07-29 DIAGNOSIS — J101 Influenza due to other identified influenza virus with other respiratory manifestations: Secondary | ICD-10-CM | POA: Insufficient documentation

## 2023-07-29 DIAGNOSIS — R0602 Shortness of breath: Secondary | ICD-10-CM | POA: Diagnosis not present

## 2023-07-29 DIAGNOSIS — Z20822 Contact with and (suspected) exposure to covid-19: Secondary | ICD-10-CM | POA: Insufficient documentation

## 2023-07-29 DIAGNOSIS — R059 Cough, unspecified: Secondary | ICD-10-CM | POA: Diagnosis present

## 2023-07-29 DIAGNOSIS — J111 Influenza due to unidentified influenza virus with other respiratory manifestations: Secondary | ICD-10-CM

## 2023-07-29 LAB — RESP PANEL BY RT-PCR (RSV, FLU A&B, COVID)  RVPGX2
Influenza A by PCR: POSITIVE — AB
Influenza B by PCR: NEGATIVE
Resp Syncytial Virus by PCR: NEGATIVE
SARS Coronavirus 2 by RT PCR: NEGATIVE

## 2023-07-29 LAB — CBC
HCT: 35.3 % — ABNORMAL LOW (ref 36.0–46.0)
Hemoglobin: 11.6 g/dL — ABNORMAL LOW (ref 12.0–15.0)
MCH: 30.9 pg (ref 26.0–34.0)
MCHC: 32.9 g/dL (ref 30.0–36.0)
MCV: 93.9 fL (ref 80.0–100.0)
Platelets: 167 10*3/uL (ref 150–400)
RBC: 3.76 MIL/uL — ABNORMAL LOW (ref 3.87–5.11)
RDW: 13.1 % (ref 11.5–15.5)
WBC: 5.5 10*3/uL (ref 4.0–10.5)
nRBC: 0 % (ref 0.0–0.2)

## 2023-07-29 LAB — TROPONIN I (HIGH SENSITIVITY)
Troponin I (High Sensitivity): 6 ng/L (ref <18)
Troponin I (High Sensitivity): 6 ng/L (ref <18)
Troponin I (High Sensitivity): 5 ng/L (ref <18)

## 2023-07-29 LAB — BASIC METABOLIC PANEL
Anion gap: 10 (ref 5–15)
BUN: 19 mg/dL (ref 8–23)
CO2: 27 mmol/L (ref 22–32)
Calcium: 8.8 mg/dL — ABNORMAL LOW (ref 8.9–10.3)
Chloride: 95 mmol/L — ABNORMAL LOW (ref 98–111)
Creatinine, Ser: 0.82 mg/dL (ref 0.44–1.00)
GFR, Estimated: >60 mL/min (ref >60)
Glucose, Bld: 106 mg/dL — ABNORMAL HIGH (ref 70–99)
Potassium: 4.1 mmol/L (ref 3.5–5.1)
Sodium: 132 mmol/L — ABNORMAL LOW (ref 135–145)

## 2023-07-29 LAB — BRAIN NATRIURETIC PEPTIDE: B Natriuretic Peptide: 65.4 pg/mL (ref 0.0–100.0)

## 2023-07-29 MED ORDER — METHYLPREDNISOLONE SODIUM SUCC 125 MG IJ SOLR
125.0000 mg | Freq: Once | INTRAMUSCULAR | Status: AC
  Administered 2023-07-29: 125 mg via INTRAVENOUS
  Filled 2023-07-29: qty 2

## 2023-07-29 MED ORDER — ONDANSETRON HCL 4 MG/2ML IJ SOLN
4.0000 mg | Freq: Once | INTRAMUSCULAR | Status: AC
  Administered 2023-07-29: 4 mg via INTRAVENOUS
  Filled 2023-07-29: qty 2

## 2023-07-29 MED ORDER — ONDANSETRON HCL 4 MG PO TABS: 4.0000 mg | ORAL_TABLET | Freq: Four times a day (QID) | ORAL | 0 refills | Status: AC

## 2023-07-29 MED ORDER — ALBUTEROL SULFATE HFA 108 (90 BASE) MCG/ACT IN AERS
2.0000 | INHALATION_SPRAY | Freq: Once | RESPIRATORY_TRACT | Status: AC
  Administered 2023-07-29: 2 via RESPIRATORY_TRACT
  Filled 2023-07-29: qty 6.7

## 2023-07-29 MED ORDER — OSELTAMIVIR PHOSPHATE 75 MG PO CAPS: 75.0000 mg | ORAL_CAPSULE | Freq: Two times a day (BID) | ORAL | 0 refills | Status: DC

## 2023-07-29 MED ORDER — IPRATROPIUM-ALBUTEROL 0.5-2.5 (3) MG/3ML IN SOLN
3.0000 mL | RESPIRATORY_TRACT | Status: DC | PRN
  Administered 2023-07-29: 3 mL via RESPIRATORY_TRACT
  Filled 2023-07-29: qty 3

## 2023-07-29 MED ORDER — OSELTAMIVIR PHOSPHATE 75 MG PO CAPS
75.0000 mg | ORAL_CAPSULE | Freq: Once | ORAL | Status: AC
  Administered 2023-07-29: 75 mg via ORAL
  Filled 2023-07-29: qty 1

## 2023-07-29 NOTE — ED Provider Notes (Signed)
  EMERGENCY DEPARTMENT AT [facility] Provider Note   CSN: [id number] Arrival date & time: 07/29/23  1529     History Chief Complaint  Patient presents with   Cough    HPI Savannah Jimenez is a 88 y.o. female presenting for chief complaint of cough and shortness of breath.  88 year old female with failure to thrive. Family was out of town just came back today and the patient has been sitting in the bed for the last 2 days. Multiple sick contacts recently.  Patient's recorded medical, surgical, social, medication list and allergies were reviewed in the Snapshot window as part of the initial history.   Review of Systems   Review of Systems  Constitutional:  Positive for fever. Negative for chills.  HENT:  Positive for congestion. Negative for ear pain and sore throat.   Eyes:  Negative for pain and visual disturbance.  Respiratory:  Positive for shortness of breath. Negative for cough and choking.   Cardiovascular:  Negative for chest pain and palpitations.  Gastrointestinal:  Negative for abdominal pain and vomiting.  Genitourinary:  Negative for dysuria and hematuria.  Musculoskeletal:  Negative for arthralgias and back pain.  Skin:  Negative for color change and rash.  Neurological:  Positive for weakness. Negative for seizures and syncope.  All other systems reviewed and are negative.   Physical Exam Updated Vital Signs BP (!) 166/75   Pulse 85   Temp 97.8 F (36.6 C) (Oral)   Resp 20   Wt 48.5 kg   SpO2 91%   BMI 18.95 kg/m  Physical Exam Vitals and nursing note reviewed.  Constitutional:      General: She is not in acute distress.    Appearance: She is well-developed.  HENT:     Head: Normocephalic and atraumatic.  Eyes:     Conjunctiva/sclera: Conjunctivae normal.  Cardiovascular:     Rate and Rhythm: Normal rate and regular rhythm.     Heart sounds: No murmur heard. Pulmonary:     Effort: Pulmonary effort is normal. No respiratory  distress.     Breath sounds: Normal breath sounds.  Abdominal:     General: There is no distension.     Palpations: Abdomen is soft.     Tenderness: There is no abdominal tenderness. There is no right CVA tenderness or left CVA tenderness.  Musculoskeletal:        General: No swelling or tenderness. Normal range of motion.     Cervical back: Neck supple.  Skin:    General: Skin is warm and dry.  Neurological:     General: No focal deficit present.     Mental Status: She is alert and oriented to person, place, and time. Mental status is at baseline.     Cranial Nerves: No cranial nerve deficit.      ED Course/ Medical Decision Making/ A&P    Procedures Procedures   Medications Ordered in ED Medications  ipratropium-albuterol  (DUONEB) 0.5-2.5 (3) MG/3ML nebulizer solution 3 mL (3 mLs Nebulization Given 07/29/23 1632)  ondansetron  (ZOFRAN ) injection 4 mg (has no administration in time range)  albuterol  (VENTOLIN  HFA) 108 (90 Base) MCG/ACT inhaler 2 puff (has no administration in time range)  methylPREDNISolone  sodium succinate (SOLU-MEDROL ) 125 mg/2 mL injection 125 mg (125 mg Intravenous Given 07/29/23 1640)  oseltamivir  (TAMIFLU ) capsule 75 mg (75 mg Oral Given 07/29/23 1655)    Medical Decision Making:    Savannah Jimenez is a 88 y.o. female who presented  to the ED today with shortness of breath detailed above.     Complete initial physical exam performed, notably the patient  was hemodynamically stable no acute distress.  She is borderline low saturations at 90%.      Reviewed and confirmed nursing documentation for past medical history, family history, social history.    Initial Assessment:   With the patient's presentation of shortness of breath, most likely diagnosis is nonspecific etiology including viral upper respiratory infection causing respiratory distress. Other diagnoses were considered including (but not limited to) pneumonia, pneumothorax, CAD, ACS. These are  considered less likely due to history of present illness and physical exam findings.   This is most consistent with an acute life/limb threatening illness complicated by underlying chronic conditions.  Initial Plan:  Screening labs including CBC and Metabolic panel to evaluate for infectious or metabolic etiology of disease.  CXR to evaluate for structural/infectious intrathoracic pathology.  EKG to evaluate for cardiac pathology. Viral screening to evaluate for infectious pathology Objective evaluation as below reviewed with plan for close reassessment  Initial Study Results:   Laboratory  All laboratory results reviewed without evidence of clinically relevant pathology.   Exceptions include: Influenza A  EKG EKG was reviewed independently. Rate, rhythm, axis, intervals all examined and without medically relevant abnormality. ST segments without concerns for elevations.    Radiology  All images reviewed independently. Agree with radiology report at this time.   DG Chest 2 View Result Date: 07/29/2023 CLINICAL DATA:  Shortness of breath, productive cough. EXAM: CHEST - 2 VIEW COMPARISON:  Chest radiograph dated 07/06/2021 and CT chest dated 04/10/2022 FINDINGS: The heart is enlarged. Vascular calcifications are seen in the aortic arch. There is mild left basilar atelectasis. No pleural effusion or pneumothorax. Degenerative changes are seen in the spine. IMPRESSION: Mild left basilar atelectasis. Electronically Signed   By: Norman Hopper M.D.   On: 07/29/2023 16:18     Reassessment and Plan:   Patient's history present illness physicals and findings most consistent with acute influenza infection. She initially was desatting but after breathing treatment she is gradually improved.  She had Medical Decision Making.  I recommended admission for close observation.  However patient and family member at bedside strongly request outpatient management.  This patient is in no acute distress, she  could likely be cared for in the outpatient setting has not she has 24-hour assist which the family will provide.  Tamiflu  and Zofran  sent to patient's pharmacy for management of her symptoms and albuterol  given to her here in the emergency room for as needed utilization at home. Strict return precautions reinforced patient stable for outpatient care management at this time.    Disposition:  I have considered need for hospitalization, however, considering all of the above, I believe this patient is stable for discharge at this time.  Patient/family educated about specific return precautions for given chief complaint and symptoms.  Patient/family educated about follow-up with PCP.     Patient/family expressed understanding of return precautions and need for follow-up. Patient spoken to regarding all imaging and laboratory results and appropriate follow up for these results. All education provided in verbal form with additional information in written form. Time was allowed for answering of patient questions. Patient discharged.    Emergency Department Medication Summary:   Medications  ipratropium-albuterol  (DUONEB) 0.5-2.5 (3) MG/3ML nebulizer solution 3 mL (3 mLs Nebulization Given 07/29/23 1632)  ondansetron  (ZOFRAN ) injection 4 mg (has no administration in time range)  albuterol  (VENTOLIN   HFA) 108 (90 Base) MCG/ACT inhaler 2 puff (has no administration in time range)  methylPREDNISolone  sodium succinate (SOLU-MEDROL ) 125 mg/2 mL injection 125 mg (125 mg Intravenous Given 07/29/23 1640)  oseltamivir  (TAMIFLU ) capsule 75 mg (75 mg Oral Given 07/29/23 1655)         Clinical Impression:  1. Flu      Discharge   Final Clinical Impression(s) / ED Diagnoses Final diagnoses:  Flu    Rx / DC Orders ED Discharge Orders          Ordered    ondansetron  (ZOFRAN ) 4 MG tablet  Every 6 hours        07/29/23 1808    oseltamivir  (TAMIFLU ) 75 MG capsule  Every 12 hours        07/29/23 1808               Jerral Meth, MD 07/29/23 1809

## 2023-07-29 NOTE — ED Notes (Signed)
 Pt alert and oriented X 4 at the time of discharge. RR even and unlabored. No acute distress noted. Pt verbalized understanding of discharge instructions as discussed. Pt ambulatory to lobby at time of discharge.

## 2023-07-29 NOTE — ED Notes (Signed)
 RT Note: Patient oxygen saturation on room air while at rest 97%, HR 86, RR 16 Patient oxygen saturation on room air while ambulating 92-93%, HR97, RR 18  Patient tolerated the walk well

## 2023-07-29 NOTE — ED Triage Notes (Signed)
 Pt arrives with c/o productive cough, congestion, and SOB that started about a week ago. Pt denies fevers or CP/back pain. Pt reports nausea, decrease PO intake, and fatigue.

## 2023-08-09 ENCOUNTER — Telehealth: Payer: Self-pay | Admitting: Internal Medicine

## 2023-08-09 NOTE — Telephone Encounter (Signed)
PDMP reviewed, Rx sent 

## 2023-08-09 NOTE — Telephone Encounter (Signed)
Requesting: Ambien 5mg   Contract: 12/05/22 UDS: Ambien only  Last Visit: 03/28/23 Next Visit: 09/26/23 Last Refill: 05/02/23 #90 and 0RF   Please Advise

## 2023-08-10 ENCOUNTER — Inpatient Hospital Stay (HOSPITAL_BASED_OUTPATIENT_CLINIC_OR_DEPARTMENT_OTHER)
Admission: EM | Admit: 2023-08-10 | Discharge: 2023-08-18 | DRG: 871 | Disposition: A | Discharge status: Home Health | Payer: Medicare Other | Attending: Internal Medicine | Admitting: Internal Medicine

## 2023-08-10 ENCOUNTER — Encounter (HOSPITAL_BASED_OUTPATIENT_CLINIC_OR_DEPARTMENT_OTHER): Payer: Self-pay | Admitting: Emergency Medicine

## 2023-08-10 ENCOUNTER — Emergency Department (HOSPITAL_BASED_OUTPATIENT_CLINIC_OR_DEPARTMENT_OTHER): Payer: Medicare Other

## 2023-08-10 ENCOUNTER — Other Ambulatory Visit: Payer: Self-pay

## 2023-08-10 DIAGNOSIS — K219 Gastro-esophageal reflux disease without esophagitis: Secondary | ICD-10-CM | POA: Diagnosis present

## 2023-08-10 DIAGNOSIS — E785 Hyperlipidemia, unspecified: Secondary | ICD-10-CM | POA: Diagnosis present

## 2023-08-10 DIAGNOSIS — Z79899 Other long term (current) drug therapy: Secondary | ICD-10-CM

## 2023-08-10 DIAGNOSIS — Z8619 Personal history of other infectious and parasitic diseases: Secondary | ICD-10-CM

## 2023-08-10 DIAGNOSIS — J189 Pneumonia, unspecified organism: Principal | ICD-10-CM | POA: Diagnosis present

## 2023-08-10 DIAGNOSIS — E878 Other disorders of electrolyte and fluid balance, not elsewhere classified: Secondary | ICD-10-CM | POA: Diagnosis present

## 2023-08-10 DIAGNOSIS — D649 Anemia, unspecified: Secondary | ICD-10-CM | POA: Diagnosis present

## 2023-08-10 DIAGNOSIS — J159 Unspecified bacterial pneumonia: Secondary | ICD-10-CM | POA: Diagnosis present

## 2023-08-10 DIAGNOSIS — H40009 Preglaucoma, unspecified, unspecified eye: Secondary | ICD-10-CM | POA: Diagnosis present

## 2023-08-10 DIAGNOSIS — Z90710 Acquired absence of both cervix and uterus: Secondary | ICD-10-CM

## 2023-08-10 DIAGNOSIS — E860 Dehydration: Secondary | ICD-10-CM | POA: Diagnosis present

## 2023-08-10 DIAGNOSIS — Z1152 Encounter for screening for COVID-19: Secondary | ICD-10-CM

## 2023-08-10 DIAGNOSIS — A419 Sepsis, unspecified organism: Secondary | ICD-10-CM | POA: Diagnosis not present

## 2023-08-10 DIAGNOSIS — Z882 Allergy status to sulfonamides status: Secondary | ICD-10-CM

## 2023-08-10 DIAGNOSIS — E871 Hypo-osmolality and hyponatremia: Secondary | ICD-10-CM | POA: Diagnosis present

## 2023-08-10 DIAGNOSIS — Z85828 Personal history of other malignant neoplasm of skin: Secondary | ICD-10-CM

## 2023-08-10 DIAGNOSIS — Z7901 Long term (current) use of anticoagulants: Secondary | ICD-10-CM

## 2023-08-10 DIAGNOSIS — E876 Hypokalemia: Secondary | ICD-10-CM | POA: Diagnosis present

## 2023-08-10 DIAGNOSIS — Z8249 Family history of ischemic heart disease and other diseases of the circulatory system: Secondary | ICD-10-CM

## 2023-08-10 DIAGNOSIS — Z880 Allergy status to penicillin: Secondary | ICD-10-CM

## 2023-08-10 DIAGNOSIS — R531 Weakness: Secondary | ICD-10-CM | POA: Diagnosis not present

## 2023-08-10 DIAGNOSIS — I480 Paroxysmal atrial fibrillation: Secondary | ICD-10-CM | POA: Diagnosis present

## 2023-08-10 DIAGNOSIS — J9601 Acute respiratory failure with hypoxia: Secondary | ICD-10-CM | POA: Diagnosis present

## 2023-08-10 DIAGNOSIS — Z887 Allergy status to serum and vaccine status: Secondary | ICD-10-CM

## 2023-08-10 DIAGNOSIS — J188 Other pneumonia, unspecified organism: Secondary | ICD-10-CM | POA: Diagnosis present

## 2023-08-10 DIAGNOSIS — M810 Age-related osteoporosis without current pathological fracture: Secondary | ICD-10-CM | POA: Diagnosis present

## 2023-08-10 DIAGNOSIS — E875 Hyperkalemia: Secondary | ICD-10-CM | POA: Diagnosis present

## 2023-08-10 DIAGNOSIS — I10 Essential (primary) hypertension: Secondary | ICD-10-CM | POA: Diagnosis present

## 2023-08-10 DIAGNOSIS — M1990 Unspecified osteoarthritis, unspecified site: Secondary | ICD-10-CM | POA: Diagnosis present

## 2023-08-10 DIAGNOSIS — F32A Depression, unspecified: Secondary | ICD-10-CM | POA: Diagnosis present

## 2023-08-10 LAB — URINALYSIS, W/ REFLEX TO CULTURE (INFECTION SUSPECTED)
Bilirubin Urine: NEGATIVE
Glucose, UA: NEGATIVE mg/dL
Ketones, ur: 15 mg/dL — AB
Nitrite: POSITIVE — AB
Protein, ur: NEGATIVE mg/dL
Specific Gravity, Urine: 1.01 (ref 1.005–1.030)
pH: 7 (ref 5.0–8.0)

## 2023-08-10 LAB — COMPREHENSIVE METABOLIC PANEL
ALT: 18 U/L (ref 0–44)
AST: 20 U/L (ref 15–41)
Albumin: 3.2 g/dL — ABNORMAL LOW (ref 3.5–5.0)
Alkaline Phosphatase: 63 U/L (ref 38–126)
Anion gap: 11 (ref 5–15)
BUN: 14 mg/dL (ref 8–23)
CO2: 24 mmol/L (ref 22–32)
Calcium: 8.5 mg/dL — ABNORMAL LOW (ref 8.9–10.3)
Chloride: 95 mmol/L — ABNORMAL LOW (ref 98–111)
Creatinine, Ser: 0.45 mg/dL (ref 0.44–1.00)
GFR, Estimated: >60 mL/min (ref >60)
Glucose, Bld: 118 mg/dL — ABNORMAL HIGH (ref 70–99)
Potassium: 3.9 mmol/L (ref 3.5–5.1)
Sodium: 130 mmol/L — ABNORMAL LOW (ref 135–145)
Total Bilirubin: 1.4 mg/dL — ABNORMAL HIGH (ref 0.0–1.2)
Total Protein: 7.1 g/dL (ref 6.5–8.1)

## 2023-08-10 LAB — CBC WITH DIFFERENTIAL/PLATELET
Abs Immature Granulocytes: 0.04 10*3/uL (ref 0.00–0.07)
Basophils Absolute: 0 10*3/uL (ref 0.0–0.1)
Basophils Relative: 0 %
Eosinophils Absolute: 0 10*3/uL (ref 0.0–0.5)
Eosinophils Relative: 0 %
HCT: 34.7 % — ABNORMAL LOW (ref 36.0–46.0)
Hemoglobin: 11.4 g/dL — ABNORMAL LOW (ref 12.0–15.0)
Immature Granulocytes: 0 %
Lymphocytes Relative: 6 %
Lymphs Abs: 0.7 10*3/uL (ref 0.7–4.0)
MCH: 30.8 pg (ref 26.0–34.0)
MCHC: 32.9 g/dL (ref 30.0–36.0)
MCV: 93.8 fL (ref 80.0–100.0)
Monocytes Absolute: 0.6 10*3/uL (ref 0.1–1.0)
Monocytes Relative: 5 %
Neutro Abs: 10.8 10*3/uL — ABNORMAL HIGH (ref 1.7–7.7)
Neutrophils Relative %: 89 %
Platelets: 311 10*3/uL (ref 150–400)
RBC: 3.7 MIL/uL — ABNORMAL LOW (ref 3.87–5.11)
RDW: 13 % (ref 11.5–15.5)
WBC: 12.1 10*3/uL — ABNORMAL HIGH (ref 4.0–10.5)
nRBC: 0 % (ref 0.0–0.2)

## 2023-08-10 LAB — RESP PANEL BY RT-PCR (RSV, FLU A&B, COVID)  RVPGX2
Influenza A by PCR: NEGATIVE
Influenza B by PCR: NEGATIVE
Resp Syncytial Virus by PCR: NEGATIVE
SARS Coronavirus 2 by RT PCR: NEGATIVE

## 2023-08-10 LAB — PROTIME-INR
INR: 1.2 (ref 0.8–1.2)
Prothrombin Time: 15.6 s — ABNORMAL HIGH (ref 11.4–15.2)

## 2023-08-10 LAB — LACTIC ACID, PLASMA: Lactic Acid, Venous: 1 mmol/L (ref 0.5–1.9)

## 2023-08-10 LAB — APTT: aPTT: 29 s (ref 24–36)

## 2023-08-10 MED ORDER — LACTATED RINGERS IV SOLN: INTRAVENOUS | Status: AC

## 2023-08-10 MED ORDER — SODIUM CHLORIDE 0.9 % IV SOLN
2.0000 g | Freq: Once | INTRAVENOUS | Status: AC
  Administered 2023-08-10: 2 g via INTRAVENOUS

## 2023-08-10 MED ORDER — VANCOMYCIN HCL IN DEXTROSE 1-5 GM/200ML-% IV SOLN
1000.0000 mg | Freq: Once | INTRAVENOUS | Status: AC
  Administered 2023-08-10: 1000 mg via INTRAVENOUS
  Filled 2023-08-10: qty 200

## 2023-08-10 MED ORDER — LACTATED RINGERS IV BOLUS (SEPSIS)
1000.0000 mL | Freq: Once | INTRAVENOUS | Status: AC
  Administered 2023-08-10: 1000 mL via INTRAVENOUS

## 2023-08-10 MED ORDER — LACTATED RINGERS IV BOLUS (SEPSIS)
500.0000 mL | Freq: Once | INTRAVENOUS | Status: AC
  Administered 2023-08-10: 500 mL via INTRAVENOUS

## 2023-08-10 MED ORDER — METRONIDAZOLE 500 MG/100ML IV SOLN
500.0000 mg | Freq: Once | INTRAVENOUS | Status: AC
  Administered 2023-08-10: 500 mg via INTRAVENOUS
  Filled 2023-08-10: qty 100

## 2023-08-10 NOTE — ED Provider Notes (Signed)
[place] EMERGENCY DEPARTMENT AT [facility] [place] Provider Note   CSN: [id number] Arrival date & time: 08/10/23  1911     History  No chief complaint on file.   Savannah Jimenez is a 88 y.o. female.  The history is provided by the patient and medical records. No language interpreter was used.  Shortness of Breath Severity:  Moderate Onset quality:  Gradual Duration:  2 days Timing:  Constant Progression:  Worsening Chronicity:  Recurrent Context: URI   Relieved by:  Nothing Worsened by:  Coughing Ineffective treatments:  None tried Associated symptoms: cough, fever and sputum production   Associated symptoms: no abdominal pain, no chest pain, no headaches, no hemoptysis, no neck pain, no vomiting and no wheezing        Home Medications Prior to Admission medications   Medication Sig Start Date End Date Taking? Authorizing Provider  atorvastatin (LIPITOR) 20 MG tablet Take 1 tablet (20 mg total) by mouth at bedtime. 08/09/23   Wanda Plump, MD  busPIRone (BUSPAR) 7.5 MG tablet Take 1 tablet (7.5 mg total) by mouth 2 (two) times daily. 08/09/23   Wanda Plump, MD  Cholecalciferol (VITAMIN D) 50 MCG (2000 UT) CAPS Take by mouth.    [provider]  Cyanocobalamin (B-12) 1000 MCG TABS Take by mouth.    [provider]  ELIQUIS 2.5 MG TABS tablet TAKE 1 TABLET BY MOUTH TWICE  DAILY 01/22/23   Runell Gess, MD  fish oil-omega-3 fatty acids 1000 MG capsule Take 2 g by mouth daily.    [provider]  fluticasone (FLONASE) 50 MCG/ACT nasal spray SHAKE WELL AND USE 2 SPRAYS IN [facility] NOSTRIL DAILY 08/08/16   Gordy Savers, MD  metoprolol succinate (TOPROL-XL) 25 MG 24 hr tablet TAKE ONE-HALF TABLET BY MOUTH  DAILY 05/28/23   Runell Gess, MD  Multiple Vitamins-Minerals (PRESERVISION AREDS 2) CAPS Take 1 capsule by mouth 2 (two) times daily.     [provider]  ondansetron (ZOFRAN) 4 MG tablet Take 1 tablet (4 mg total) by mouth  every 6 (six) hours. 07/29/23   Glyn Ade, MD  oseltamivir (TAMIFLU) 75 MG capsule Take 1 capsule (75 mg total) by mouth every 12 (twelve) hours. 07/29/23   Glyn Ade, MD  zolpidem (AMBIEN) 5 MG tablet TAKE 1 TABLET BY MOUTH AT  BEDTIME AS NEEDED 08/09/23   Wanda Plump, MD      Allergies    Tetanus toxoids, Penicillins, Sulfacetamide sodium, and Sulfamethoxazole-trimethoprim    Review of Systems   Review of Systems  Constitutional:  Positive for chills, fatigue and fever.  HENT:  Positive for congestion.   Respiratory:  Positive for cough, sputum production and shortness of breath. Negative for hemoptysis, chest tightness, wheezing and stridor.   Cardiovascular:  Negative for chest pain, palpitations and leg swelling.  Gastrointestinal:  Negative for abdominal pain, constipation, diarrhea, nausea and vomiting.  Genitourinary:  Negative for dysuria.  Musculoskeletal:  Negative for back pain and neck pain.  Neurological:  Negative for headaches.  Psychiatric/Behavioral:  Negative for agitation and confusion.   All other systems reviewed and are negative.   Physical Exam Updated Vital Signs BP (!) 166/83 (BP Location: Right Arm)   Pulse (!) 111   Temp (!) 100.4 F (38 C) (Oral)   Resp (!) 24   Ht 5\' 3"  (1.6 m)   Wt 46 kg   SpO2 90%   BMI 17.98 kg/m  Physical Exam Vitals  and nursing note reviewed.  Constitutional:      General: She is not in acute distress.    Appearance: She is well-developed. She is ill-appearing. She is not toxic-appearing or diaphoretic.  HENT:     Head: Normocephalic and atraumatic.     Nose: Congestion present.     Mouth/Throat:     Mouth: Mucous membranes are dry.     Pharynx: No oropharyngeal exudate or posterior oropharyngeal erythema.  Eyes:     Extraocular Movements: Extraocular movements intact.     Conjunctiva/sclera: Conjunctivae normal.     Pupils: Pupils are equal, round, and reactive to light.  Cardiovascular:     Rate and  Rhythm: Regular rhythm. Tachycardia present.     Heart sounds: No murmur heard. Pulmonary:     Breath sounds: Rhonchi present. No wheezing or rales.  Chest:     Chest wall: No tenderness.  Abdominal:     General: Abdomen is flat.     Palpations: Abdomen is soft.     Tenderness: There is no abdominal tenderness. There is no right CVA tenderness, left CVA tenderness, guarding or rebound.  Musculoskeletal:        General: No swelling or tenderness.     Cervical back: Neck supple.     Right lower leg: No edema.     Left lower leg: No edema.  Skin:    General: Skin is warm and dry.     Capillary Refill: Capillary refill takes less than 2 seconds.     Findings: No erythema or rash.  Neurological:     Mental Status: She is alert.  Psychiatric:        Mood and Affect: Mood normal.     ED Results / Procedures / Treatments   Labs (all labs ordered are listed, but only abnormal results are displayed) Labs Reviewed  COMPREHENSIVE METABOLIC PANEL - Abnormal; Notable for the following components:      Result Value   Sodium 130 (*)    Chloride 95 (*)    Glucose, Bld 118 (*)    Calcium 8.5 (*)    Albumin 3.2 (*)    Total Bilirubin 1.4 (*)    All other components within normal limits  CBC WITH DIFFERENTIAL/PLATELET - Abnormal; Notable for the following components:   WBC 12.1 (*)    RBC 3.70 (*)    Hemoglobin 11.4 (*)    HCT 34.7 (*)    Neutro Abs 10.8 (*)    All other components within normal limits  PROTIME-INR - Abnormal; Notable for the following components:   Prothrombin Time 15.6 (*)    All other components within normal limits  RESP PANEL BY RT-PCR (RSV, FLU A&B, COVID)  RVPGX2  CULTURE, BLOOD (ROUTINE X 2)  CULTURE, BLOOD (ROUTINE X 2)  LACTIC ACID, PLASMA  APTT  LACTIC ACID, PLASMA  URINALYSIS, W/ REFLEX TO CULTURE (INFECTION SUSPECTED)    EKG EKG Interpretation Date/Time:  Friday August 10 2023 20:24:06 EST Ventricular Rate:  105 PR Interval:  208 QRS  Duration:  81 QT Interval:  327 QTC Calculation: 433 R Axis:   260  Text Interpretation: Sinus tachycardia Borderline prolonged PR interval Biatrial enlargement Probable anterior infarct, old when compared to prior, faster rate and more wandering baseline No STEMI Confirmed by Theda Belfast ([id number]) on 08/10/2023 9:30:32 PM  Radiology DG Chest Port 1 View Result Date: 08/10/2023 CLINICAL DATA:  Weakness, nausea, and worsening cough and congestion. EXAM: PORTABLE CHEST 1 VIEW COMPARISON:  07/29/2023.  FINDINGS: The heart size and mediastinal contours are stable. There is atherosclerotic calcification of the aorta. Hyperinflation of the lungs is noted. Patchy airspace disease is noted in the mid to left lower lung field and left lung base. There is a small right pleural effusion. No pneumothorax is seen. No acute osseous abnormality is identified. IMPRESSION: 1. Patchy airspace disease in the mid to lower right lung field and left lung base, suspicious for multifocal pneumonia. 2. Small right pleural effusion. Electronically Signed   By: Thornell Sartorius M.D.   On: 08/10/2023 20:54    Procedures Procedures    CRITICAL CARE Performed by: Canary Brim Jeris Easterly Total critical care time: 35 minutes Critical care time was exclusive of separately billable procedures and treating other patients. Critical care was necessary to treat or prevent imminent or life-threatening deterioration. Critical care was time spent personally by me on the following activities: development of treatment plan with patient and/or surrogate as well as nursing, discussions with consultants, evaluation of patient's response to treatment, examination of patient, obtaining history from patient or surrogate, ordering and performing treatments and interventions, ordering and review of laboratory studies, ordering and review of radiographic studies, pulse oximetry and re-evaluation of patient's condition.  Medications Ordered in  ED Medications  lactated ringers infusion (has no administration in time range)  metroNIDAZOLE (FLAGYL) IVPB 500 mg (500 mg Intravenous New Bag/Given 08/10/23 2117)  vancomycin (VANCOCIN) IVPB 1000 mg/200 mL premix (has no administration in time range)  lactated ringers bolus 1,000 mL (1,000 mLs Intravenous New Bag/Given 08/10/23 2034)    And  lactated ringers bolus 500 mL (500 mLs Intravenous New Bag/Given 08/10/23 2034)  aztreonam (AZACTAM) 2 g in sodium chloride 0.9 % 100 mL IVPB (0 g Intravenous Stopped 08/10/23 2113)    ED Course/ Medical Decision Making/ A&P                                 Medical Decision Making Amount and/or Complexity of Data Reviewed Labs: ordered. Radiology: ordered.  Risk Prescription drug management. Decision regarding hospitalization.    TALEEN DICKARD is a 88 y.o. female with a past medical history significant for paroxysmal atrial fibrillation on Eliquis therapy, osteoporosis, GERD, anxiety, and polymyalgia rheumatica who presents for shortness of breath cough and fatigue.  According to patient, she was diagnosed with influenza 12 days ago and completed a course of Tamiflu.  She reports she had done better but last 48 hours started worsening.  She has had productive cough, fatigue, shortness of breath and now fevers or chills.  She had some nausea but no vomiting.  Denies constipation or diarrhea.  Reports feeling fatigued and has no energy.  When she was previously seen they discussed possible admission given her borderline hypoxia but oxygen saturation send remained in the 90s at that time.  She was able to go home and had been doing better.  Patient on arrival is tachycardic, tachypneic, febrile, and hypoxic with oxygen saturations about 87% on room air.  She was placed on oxygen for assessment.  On exam, she has rhonchi in her lungs right worse than left.  Chest and abdomen are nontender.  Back and flanks nontender.  No neck tenderness.  Patient moving  all extremities and had no focal neurologic deficits but patient does have dry mucous membranes and appears ill.  EKG no STEMI.  Clinically I am concerned about sepsis based on her vital signs and  story.  I am concerned she has a post flu pneumonia that may have settled in.  Will make her code sepsis and give her fluids.  She will remain on 3 L nasal cannula for oxygen supplementation and she will ultimately need admission.  Will get all the imaging and labs and x-ray.  Anticipate admission after workup is completed.  9:30 PM X-ray shows multifocal pneumonia.  She has leukocytosis.  Lactic acid is normal however.  She is negative for COVID/flu/RSV but she just was positive flu several weeks ago.  Suspect a postviral pneumonia.  Patient took her home medicines and will be admitted for further management of multifocal pneumonia causing sepsis and hypoxia.         Final Clinical Impression(s) / ED Diagnoses Final diagnoses:  Multifocal pneumonia  Sepsis, due to unspecified organism, unspecified whether acute organ dysfunction present [facility])     Clinical Impression: 1. Multifocal pneumonia   2. Sepsis, due to unspecified organism, unspecified whether acute organ dysfunction present [facility])     Disposition: Admit  This note was prepared with assistance of Dragon voice recognition software. Occasional wrong-word or sound-a-like substitutions may have occurred due to the inherent limitations of voice recognition software.      Darnella Zeiter, Canary Brim, MD 08/10/23 2131

## 2023-08-10 NOTE — Progress Notes (Signed)
Pt being followed by [facility] for Sepsis protocol. 

## 2023-08-10 NOTE — ED Notes (Signed)
ED Doctor still waiting for a call back from the Hospitalist paged twice . RE paged @ 22:09

## 2023-08-10 NOTE — ED Triage Notes (Signed)
Pt reports she was dx with Flu A on 1/5 and finished a course of Tamiflu, today started having nausea, weakness, cough; denies chills, fever, myalgia

## 2023-08-11 DIAGNOSIS — J189 Pneumonia, unspecified organism: Secondary | ICD-10-CM | POA: Diagnosis not present

## 2023-08-11 DIAGNOSIS — E875 Hyperkalemia: Secondary | ICD-10-CM | POA: Diagnosis present

## 2023-08-11 DIAGNOSIS — H40009 Preglaucoma, unspecified, unspecified eye: Secondary | ICD-10-CM | POA: Diagnosis present

## 2023-08-11 DIAGNOSIS — M1990 Unspecified osteoarthritis, unspecified site: Secondary | ICD-10-CM | POA: Diagnosis present

## 2023-08-11 DIAGNOSIS — E876 Hypokalemia: Secondary | ICD-10-CM | POA: Diagnosis present

## 2023-08-11 DIAGNOSIS — Z90710 Acquired absence of both cervix and uterus: Secondary | ICD-10-CM | POA: Diagnosis not present

## 2023-08-11 DIAGNOSIS — A419 Sepsis, unspecified organism: Secondary | ICD-10-CM | POA: Diagnosis present

## 2023-08-11 DIAGNOSIS — Z85828 Personal history of other malignant neoplasm of skin: Secondary | ICD-10-CM | POA: Diagnosis not present

## 2023-08-11 DIAGNOSIS — E785 Hyperlipidemia, unspecified: Secondary | ICD-10-CM | POA: Diagnosis present

## 2023-08-11 DIAGNOSIS — Z8249 Family history of ischemic heart disease and other diseases of the circulatory system: Secondary | ICD-10-CM | POA: Diagnosis not present

## 2023-08-11 DIAGNOSIS — I480 Paroxysmal atrial fibrillation: Secondary | ICD-10-CM | POA: Diagnosis present

## 2023-08-11 DIAGNOSIS — D649 Anemia, unspecified: Secondary | ICD-10-CM | POA: Diagnosis present

## 2023-08-11 DIAGNOSIS — K219 Gastro-esophageal reflux disease without esophagitis: Secondary | ICD-10-CM | POA: Diagnosis present

## 2023-08-11 DIAGNOSIS — M810 Age-related osteoporosis without current pathological fracture: Secondary | ICD-10-CM | POA: Diagnosis present

## 2023-08-11 DIAGNOSIS — J159 Unspecified bacterial pneumonia: Secondary | ICD-10-CM | POA: Diagnosis present

## 2023-08-11 DIAGNOSIS — E871 Hypo-osmolality and hyponatremia: Secondary | ICD-10-CM | POA: Diagnosis present

## 2023-08-11 DIAGNOSIS — E878 Other disorders of electrolyte and fluid balance, not elsewhere classified: Secondary | ICD-10-CM | POA: Diagnosis present

## 2023-08-11 DIAGNOSIS — Z8619 Personal history of other infectious and parasitic diseases: Secondary | ICD-10-CM | POA: Diagnosis not present

## 2023-08-11 DIAGNOSIS — R531 Weakness: Secondary | ICD-10-CM | POA: Diagnosis present

## 2023-08-11 DIAGNOSIS — Z1152 Encounter for screening for COVID-19: Secondary | ICD-10-CM | POA: Diagnosis not present

## 2023-08-11 DIAGNOSIS — J9601 Acute respiratory failure with hypoxia: Secondary | ICD-10-CM | POA: Diagnosis present

## 2023-08-11 DIAGNOSIS — Z7901 Long term (current) use of anticoagulants: Secondary | ICD-10-CM | POA: Diagnosis not present

## 2023-08-11 DIAGNOSIS — E860 Dehydration: Secondary | ICD-10-CM | POA: Diagnosis present

## 2023-08-11 DIAGNOSIS — F32A Depression, unspecified: Secondary | ICD-10-CM | POA: Diagnosis present

## 2023-08-11 DIAGNOSIS — I10 Essential (primary) hypertension: Secondary | ICD-10-CM | POA: Diagnosis present

## 2023-08-11 LAB — COMPREHENSIVE METABOLIC PANEL
ALT: 21 U/L (ref 0–44)
AST: 23 U/L (ref 15–41)
Albumin: 2.7 g/dL — ABNORMAL LOW (ref 3.5–5.0)
Alkaline Phosphatase: 59 U/L (ref 38–126)
Anion gap: 8 (ref 5–15)
BUN: 8 mg/dL (ref 8–23)
CO2: 28 mmol/L (ref 22–32)
Calcium: 8.3 mg/dL — ABNORMAL LOW (ref 8.9–10.3)
Chloride: 96 mmol/L — ABNORMAL LOW (ref 98–111)
Creatinine, Ser: 0.46 mg/dL (ref 0.44–1.00)
GFR, Estimated: >60 mL/min (ref >60)
Glucose, Bld: 109 mg/dL — ABNORMAL HIGH (ref 70–99)
Potassium: 3.8 mmol/L (ref 3.5–5.1)
Sodium: 132 mmol/L — ABNORMAL LOW (ref 135–145)
Total Bilirubin: 1.1 mg/dL (ref 0.0–1.2)
Total Protein: 6.2 g/dL — ABNORMAL LOW (ref 6.5–8.1)

## 2023-08-11 LAB — CBC WITH DIFFERENTIAL/PLATELET
Abs Immature Granulocytes: 0.04 10*3/uL (ref 0.00–0.07)
Basophils Absolute: 0 10*3/uL (ref 0.0–0.1)
Basophils Relative: 0 %
Eosinophils Absolute: 0 10*3/uL (ref 0.0–0.5)
Eosinophils Relative: 0 %
HCT: 31.9 % — ABNORMAL LOW (ref 36.0–46.0)
Hemoglobin: 10.5 g/dL — ABNORMAL LOW (ref 12.0–15.0)
Immature Granulocytes: 0 %
Lymphocytes Relative: 8 %
Lymphs Abs: 0.8 10*3/uL (ref 0.7–4.0)
MCH: 31.2 pg (ref 26.0–34.0)
MCHC: 32.9 g/dL (ref 30.0–36.0)
MCV: 94.7 fL (ref 80.0–100.0)
Monocytes Absolute: 0.6 10*3/uL (ref 0.1–1.0)
Monocytes Relative: 6 %
Neutro Abs: 8.8 10*3/uL — ABNORMAL HIGH (ref 1.7–7.7)
Neutrophils Relative %: 86 %
Platelets: 263 10*3/uL (ref 150–400)
RBC: 3.37 MIL/uL — ABNORMAL LOW (ref 3.87–5.11)
RDW: 12.9 % (ref 11.5–15.5)
WBC: 10.2 10*3/uL (ref 4.0–10.5)
nRBC: 0 % (ref 0.0–0.2)

## 2023-08-11 LAB — MRSA NEXT GEN BY PCR, NASAL: MRSA by PCR Next Gen: NOT DETECTED

## 2023-08-11 MED ORDER — BUSPIRONE HCL 5 MG PO TABS
7.5000 mg | ORAL_TABLET | Freq: Two times a day (BID) | ORAL | Status: DC
  Administered 2023-08-11 – 2023-08-18 (×14): 7.5 mg via ORAL
  Filled 2023-08-11 (×14): qty 2
  Filled 2023-08-11: qty 1

## 2023-08-11 MED ORDER — ONDANSETRON HCL 4 MG PO TABS
4.0000 mg | ORAL_TABLET | Freq: Four times a day (QID) | ORAL | Status: DC | PRN
  Administered 2023-08-15 – 2023-08-16 (×2): 4 mg via ORAL
  Filled 2023-08-11 (×3): qty 1

## 2023-08-11 MED ORDER — ACETAMINOPHEN 650 MG RE SUPP: 650.0000 mg | Freq: Four times a day (QID) | RECTAL | Status: DC | PRN

## 2023-08-11 MED ORDER — APIXABAN 2.5 MG PO TABS
2.5000 mg | ORAL_TABLET | Freq: Two times a day (BID) | ORAL | Status: DC
  Administered 2023-08-11 – 2023-08-18 (×14): 2.5 mg via ORAL
  Filled 2023-08-11 (×14): qty 1

## 2023-08-11 MED ORDER — METOPROLOL SUCCINATE ER 25 MG PO TB24
12.5000 mg | ORAL_TABLET | Freq: Every day | ORAL | Status: DC
  Administered 2023-08-12: 12.5 mg via ORAL
  Filled 2023-08-11: qty 1

## 2023-08-11 MED ORDER — ATORVASTATIN CALCIUM 20 MG PO TABS
20.0000 mg | ORAL_TABLET | Freq: Every day | ORAL | Status: DC
  Administered 2023-08-11 – 2023-08-17 (×7): 20 mg via ORAL
  Filled 2023-08-11 (×7): qty 1

## 2023-08-11 MED ORDER — ENSURE ENLIVE PO LIQD
237.0000 mL | Freq: Two times a day (BID) | ORAL | Status: DC
Start: 2023-08-11 — End: 2023-08-18
  Administered 2023-08-13 – 2023-08-15 (×4): 237 mL via ORAL

## 2023-08-11 MED ORDER — DILTIAZEM HCL-DEXTROSE 125-5 MG/125ML-% IV SOLN (PREMIX)
5.0000 mg/h | INTRAVENOUS | Status: DC
  Administered 2023-08-11: 15 mg/h via INTRAVENOUS
  Administered 2023-08-11: 5 mg/h via INTRAVENOUS
  Administered 2023-08-12: 12.5 mg/h via INTRAVENOUS
  Filled 2023-08-11 (×4): qty 125

## 2023-08-11 MED ORDER — FLUTICASONE PROPIONATE 50 MCG/ACT NA SUSP
2.0000 | Freq: Every day | NASAL | Status: DC
Start: 2023-08-11 — End: 2023-08-18
  Administered 2023-08-14 – 2023-08-15 (×2): 2 via NASAL

## 2023-08-11 MED ORDER — DILTIAZEM LOAD VIA INFUSION
10.0000 mg | Freq: Once | INTRAVENOUS | Status: AC
  Administered 2023-08-11: 10 mg via INTRAVENOUS
  Filled 2023-08-11: qty 10

## 2023-08-11 MED ORDER — SODIUM CHLORIDE 0.9 % IV SOLN
500.0000 mg | INTRAVENOUS | Status: DC
  Administered 2023-08-11 – 2023-08-15 (×5): 500 mg via INTRAVENOUS
  Filled 2023-08-11 (×6): qty 5

## 2023-08-11 MED ORDER — OMEGA-3-ACID ETHYL ESTERS 1 G PO CAPS
2.0000 g | ORAL_CAPSULE | Freq: Every day | ORAL | Status: DC
  Administered 2023-08-12 – 2023-08-14 (×3): 2 g via ORAL
  Filled 2023-08-11 (×5): qty 2

## 2023-08-11 MED ORDER — ALBUTEROL SULFATE (2.5 MG/3ML) 0.083% IN NEBU
2.5000 mg | INHALATION_SOLUTION | RESPIRATORY_TRACT | Status: DC | PRN
Start: 2023-08-11 — End: 2023-08-17

## 2023-08-11 MED ORDER — SODIUM CHLORIDE 0.9 % IV SOLN
2.0000 g | INTRAVENOUS | Status: AC
  Administered 2023-08-11 – 2023-08-17 (×7): 2 g via INTRAVENOUS
  Filled 2023-08-11 (×7): qty 20

## 2023-08-11 MED ORDER — GUAIFENESIN-DM 100-10 MG/5ML PO SYRP: 5.0000 mL | ORAL_SOLUTION | ORAL | Status: DC | PRN

## 2023-08-11 MED ORDER — ZOLPIDEM TARTRATE 5 MG PO TABS
5.0000 mg | ORAL_TABLET | Freq: Every evening | ORAL | Status: DC | PRN
  Administered 2023-08-11 – 2023-08-17 (×7): 5 mg via ORAL
  Filled 2023-08-11 (×7): qty 1

## 2023-08-11 MED ORDER — OMEGA-3 FATTY ACIDS 1000 MG PO CAPS: 2.0000 g | ORAL_CAPSULE | Freq: Every day | ORAL | Status: DC

## 2023-08-11 MED ORDER — TRAZODONE HCL 50 MG PO TABS: 25.0000 mg | ORAL_TABLET | Freq: Every evening | ORAL | Status: DC | PRN

## 2023-08-11 MED ORDER — ONDANSETRON 4 MG PO TBDP
4.0000 mg | ORAL_TABLET | Freq: Four times a day (QID) | ORAL | Status: DC
  Administered 2023-08-11 – 2023-08-14 (×10): 4 mg via ORAL
  Filled 2023-08-11 (×11): qty 1

## 2023-08-11 MED ORDER — ACETAMINOPHEN 325 MG PO TABS: 650.0000 mg | ORAL_TABLET | Freq: Four times a day (QID) | ORAL | Status: DC | PRN

## 2023-08-11 MED ORDER — ONDANSETRON HCL 4 MG/2ML IJ SOLN: 4.0000 mg | Freq: Four times a day (QID) | INTRAMUSCULAR | Status: DC | PRN

## 2023-08-11 NOTE — ED Notes (Signed)
Introduced myself to pt and daughter, pt up to use BSC , to void pt given ginger ale and daughter given coffee  pt still has congested cough

## 2023-08-11 NOTE — ED Notes (Signed)
Pt up to have BM and void again her heart rate up dr made aware  repaeat EKG done , pt states she has taken some of her am meds already as oked by dr Hermenia Bers night

## 2023-08-11 NOTE — ED Notes (Signed)
Pt instructed on flutter valve. Pt able to demonstrate without complication and verbalizes understanding.

## 2023-08-11 NOTE — ED Provider Notes (Signed)
10:06 AM Patient has developed tachycardia after going to the bathroom in the bedside commode.  Heart rate now consistently between 120s and 140s.  Appears to be A-fib.  She has a known history of this and is on Eliquis.  Will give her her home meds that she is still boarding in the emergency department but due to her persistent tachycardia, will start her on Cardizem bolus and drip.  10:23 AM I have ordered some of her home meds, though she already took her metoprolol and eliquis this morning.  11:36 AM HR seems to be improving with diltiazem. BP stable.  12:35 PM Transferred via [facility].   EKG Interpretation Date/Time:  Saturday August 11 2023 10:01:53 EST Ventricular Rate:  129 PR Interval:  208 QRS Duration:  78 QT Interval:  317 QTC Calculation: 465 R Axis:   112  Text Interpretation: Atrial fibrillation with RVR Anterior infarct, old Confirmed by Pricilla Loveless [phone]) on 08/11/2023 10:23:28 AM         CRITICAL CARE Performed by: Audree Camel   Total critical care time: 30 minutes  Critical care time was exclusive of separately billable procedures and treating other patients.  Critical care was necessary to treat or prevent imminent or life-threatening deterioration.  Critical care was time spent personally by me on the following activities: development of treatment plan with patient and/or surrogate as well as nursing, discussions with consultants, evaluation of patient's response to treatment, examination of patient, obtaining history from patient or surrogate, ordering and performing treatments and interventions, ordering and review of laboratory studies, ordering and review of radiographic studies, pulse oximetry and re-evaluation of patient's condition.    Pricilla Loveless, MD 08/11/23 1235

## 2023-08-11 NOTE — ED Notes (Signed)
Pt remains on 2l NC

## 2023-08-11 NOTE — H&P (Addendum)
History and Physical  Savannah Jimenez ION:[id number] DOB: [birth year] DOA: 08/10/2023  PCP: Wanda Plump, MD   Chief Complaint: Weakness, cough  HPI: Savannah Jimenez is a 88 y.o. female with medical history significant for GERD, osteoporosis, paroxysmal atrial fibrillation on Eliquis and metoprolol with recent influenza A infection now being admitted to the hospital with sepsis due to postviral pneumonia.  History is provided by the patient who is quite mentally sharp, she lives at home independently and takes care of her elderly husband.  Was recently diagnosed as an outpatient with influenza A, which made her feel very weak and short of breath.  She completed a course of Tamiflu, was feeling better.  However over the last 48 hours it seems that she has developed worsening weakness, and cough productive of yellow sputum.  She denies any chest pain, fevers or nausea.  Workup in the emergency department as detailed below shows evidence of multifocal bacterial pneumonia.  She was given broad-spectrum IV antibiotics and admitted to the hospitalist service.  Review of Systems: Please see HPI for pertinent positives and negatives. A complete 10 system review of systems are otherwise negative.  Past Medical History:  Diagnosis Date   Allergy    Arthritis    BCC (basal cell carcinoma of skin)    GERD (gastroesophageal reflux disease)    Glaucoma suspect    HOH (hard of hearing)    Osteoporosis    Past Surgical History:  Procedure Laterality Date   ABDOMINAL HYSTERECTOMY     still have cervix   ARTERY BIOPSY Right 10/06/2014   Procedure: BIOPSY TEMPORAL ARTERY;  Surgeon: Darnell Level, MD;  Location: [facility];  Service: General;  Laterality: Right;   BREAST EXCISIONAL BIOPSY Right    over 20 years ago; lymph nodes removed   CATARACT EXTRACTION  10/2011   Left   colonscopy  2007   INNER EAR SURGERY  1974   both stapes replaced-steal   TUBAL LIGATION     Social History:  reports  that she has never smoked. She has never used smokeless tobacco. She reports current alcohol use. She reports that she does not use drugs.  Allergies  Allergen Reactions   Tetanus Toxoids     Arm swelling    Penicillins    Sulfacetamide Sodium    Sulfamethoxazole-Trimethoprim Other (See Comments)    Family History  Problem Relation Age of Onset   Heart failure Mother    Hypertension Sister    Asthma Sister    Hypertension Brother      Prior to Admission medications   Medication Sig Start Date End Date Taking? Authorizing Provider  atorvastatin (LIPITOR) 20 MG tablet Take 1 tablet (20 mg total) by mouth at bedtime. 08/09/23   Wanda Plump, MD  busPIRone (BUSPAR) 7.5 MG tablet Take 1 tablet (7.5 mg total) by mouth 2 (two) times daily. 08/09/23   Wanda Plump, MD  Cholecalciferol (VITAMIN D) 50 MCG (2000 UT) CAPS Take by mouth.    [provider]  Cyanocobalamin (B-12) 1000 MCG TABS Take by mouth.    [provider]  ELIQUIS 2.5 MG TABS tablet TAKE 1 TABLET BY MOUTH TWICE  DAILY 01/22/23   Runell Gess, MD  fish oil-omega-3 fatty acids 1000 MG capsule Take 2 g by mouth daily.    [provider]  fluticasone (FLONASE) 50 MCG/ACT nasal spray SHAKE WELL AND USE 2 SPRAYS IN [facility] NOSTRIL DAILY 08/08/16   Eleonore Chiquito  F, MD  metoprolol succinate (TOPROL-XL) 25 MG 24 hr tablet TAKE ONE-HALF TABLET BY MOUTH  DAILY 05/28/23   Runell Gess, MD  Multiple Vitamins-Minerals (PRESERVISION AREDS 2) CAPS Take 1 capsule by mouth 2 (two) times daily.     [provider]  ondansetron (ZOFRAN) 4 MG tablet Take 1 tablet (4 mg total) by mouth every 6 (six) hours. 07/29/23   Glyn Ade, MD  oseltamivir (TAMIFLU) 75 MG capsule Take 1 capsule (75 mg total) by mouth every 12 (twelve) hours. 07/29/23   Glyn Ade, MD  zolpidem (AMBIEN) 5 MG tablet TAKE 1 TABLET BY MOUTH AT  BEDTIME AS NEEDED 08/09/23   Wanda Plump, MD    Physical Exam: BP 124/81 (BP  Location: Right Arm)   Pulse (!) 122   Temp 98.2 F (36.8 C) (Oral)   Resp 20   Ht 5\' 3"  (1.6 m)   Wt 46 kg   SpO2 94%   BMI 17.98 kg/m  General:  Alert, oriented, calm, in no acute distress, thin elderly female appearing her stated age, currently wearing 3 L nasal cannula oxygen saturating 94%.  She is speaking in full sentences, currently with no cough. Cardiovascular: Tachycardic and irregular, no murmurs or rubs, no peripheral edema  Respiratory: She has good equal bilateral air entry, no wheezes, some diffuse rhonchi and breath sounds are distant.  However no tachypnea, cough or other evidence of respiratory distress. Abdomen: soft, nontender, nondistended, normal bowel tones heard  Skin: dry, no rashes  Musculoskeletal: no joint effusions, normal range of motion  Psychiatric: appropriate affect, normal speech  Neurologic: extraocular muscles intact, clear speech, moving all extremities with intact sensorium         Labs on Admission:  Basic Metabolic Panel: Recent Labs  Lab 08/10/23 1944 08/11/23 1032  NA 130* 132*  K 3.9 3.8  CL 95* 96*  CO2 24 28  GLUCOSE 118* 109*  BUN 14 8  CREATININE 0.45 0.46  CALCIUM 8.5* 8.3*   Liver Function Tests: Recent Labs  Lab 08/10/23 1944 08/11/23 1032  AST 20 23  ALT 18 21  ALKPHOS 63 59  BILITOT 1.4* 1.1  PROT 7.1 6.2*  ALBUMIN 3.2* 2.7*   No results for input(s): "LIPASE", "AMYLASE" in the last 168 hours. No results for input(s): "AMMONIA" in the last 168 hours. CBC: Recent Labs  Lab 08/10/23 1944 08/11/23 1032  WBC 12.1* 10.2  NEUTROABS 10.8* 8.8*  HGB 11.4* 10.5*  HCT 34.7* 31.9*  MCV 93.8 94.7  PLT 311 263   Cardiac Enzymes: No results for input(s): "CKTOTAL", "CKMB", "CKMBINDEX", "TROPONINI" in the last 168 hours. BNP (last 3 results) Recent Labs    07/29/23 1552  BNP 65.4    ProBNP (last 3 results) No results for input(s): "PROBNP" in the last 8760 hours.  CBG: No results for input(s): "GLUCAP" in  the last 168 hours.  Radiological Exams on Admission: DG Chest Port 1 View Result Date: 08/10/2023 CLINICAL DATA:  Weakness, nausea, and worsening cough and congestion. EXAM: PORTABLE CHEST 1 VIEW COMPARISON:  07/29/2023. FINDINGS: The heart size and mediastinal contours are stable. There is atherosclerotic calcification of the aorta. Hyperinflation of the lungs is noted. Patchy airspace disease is noted in the mid to left lower lung field and left lung base. There is a small right pleural effusion. No pneumothorax is seen. No acute osseous abnormality is identified. IMPRESSION: 1. Patchy airspace disease in the mid to lower right lung field and left lung  base, suspicious for multifocal pneumonia. 2. Small right pleural effusion. Electronically Signed   By: Thornell Sartorius M.D.   On: 08/10/2023 20:54   Assessment/Plan YEVONNE SONCRANT is a 88 y.o. female with medical history significant for GERD, osteoporosis, paroxysmal atrial fibrillation on Eliquis and metoprolol with recent influenza A infection now being admitted to the hospital with sepsis due to postviral multifocal pneumonia.  Sepsis due to multifocal community-acquired pneumonia.  Meeting criteria with leukocytosis, fever, tachycardia.  Source is multifocal community-acquired postviral pneumonia.  She is hemodynamically stable, with normal lactate. -Inpatient admission to progressive -Empiric IV azithromycin and IV Rocephin -Follow-up blood cultures  Hypoxic respiratory failure-due to community-acquired pneumonia, continue supplemental oxygen with goal O2 saturation greater than 90%  Atrial fibrillation with RVR-uncontrolled due to sepsis, hemodynamically stable and rate now controlled on Cardizem drip. -Continue home Toprol-XL -Continue IV Cardizem drip per protocol -Eliquis  Hyponatremia-I suspect this is hypovolemic hyponatremia due to her acute illness.  Asymptomatic. -Continue gentle LR infusion, and recheck sodium level in the  morning  Depression-BuSpar  Hyperlipidemia-Lipitor  DVT prophylaxis: Eliquis    Code Status: Full Code  Consults called: None  Admission status: The appropriate patient status for this patient is INPATIENT. Inpatient status is judged to be reasonable and necessary in order to provide the required intensity of service to ensure the patient's safety. The patient's presenting symptoms, physical exam findings, and initial radiographic and laboratory data in the context of their chronic comorbidities is felt to place them at high risk for further clinical deterioration. Furthermore, it is not anticipated that the patient will be medically stable for discharge from the hospital within 2 midnights of admission.    I certify that at the point of admission it is my clinical judgment that the patient will require inpatient hospital care spanning beyond 2 midnights from the point of admission due to high intensity of service, high risk for further deterioration and high frequency of surveillance required  Time spent: 49 minutes  Avyukt Cimo Sharlette Dense MD [facility] Pager [phone]  If 7PM-7AM, please contact night-coverage [email or web address] Password [facility]  08/11/2023, 2:34 PM

## 2023-08-11 NOTE — ED Notes (Signed)
Report to floor  1426

## 2023-08-12 DIAGNOSIS — J189 Pneumonia, unspecified organism: Secondary | ICD-10-CM | POA: Diagnosis not present

## 2023-08-12 LAB — CBC
HCT: 28.3 % — ABNORMAL LOW (ref 36.0–46.0)
Hemoglobin: 9 g/dL — ABNORMAL LOW (ref 12.0–15.0)
MCH: 31.4 pg (ref 26.0–34.0)
MCHC: 31.8 g/dL (ref 30.0–36.0)
MCV: 98.6 fL (ref 80.0–100.0)
Platelets: 243 10*3/uL (ref 150–400)
RBC: 2.87 MIL/uL — ABNORMAL LOW (ref 3.87–5.11)
RDW: 13 % (ref 11.5–15.5)
WBC: 8.1 10*3/uL (ref 4.0–10.5)
nRBC: 0 % (ref 0.0–0.2)

## 2023-08-12 LAB — BASIC METABOLIC PANEL
Anion gap: 9 (ref 5–15)
BUN: 12 mg/dL (ref 8–23)
CO2: 27 mmol/L (ref 22–32)
Calcium: 8 mg/dL — ABNORMAL LOW (ref 8.9–10.3)
Chloride: 97 mmol/L — ABNORMAL LOW (ref 98–111)
Creatinine, Ser: 0.45 mg/dL (ref 0.44–1.00)
GFR, Estimated: >60 mL/min (ref >60)
Glucose, Bld: 102 mg/dL — ABNORMAL HIGH (ref 70–99)
Potassium: 3.4 mmol/L — ABNORMAL LOW (ref 3.5–5.1)
Sodium: 133 mmol/L — ABNORMAL LOW (ref 135–145)

## 2023-08-12 MED ORDER — POTASSIUM CHLORIDE CRYS ER 20 MEQ PO TBCR
40.0000 meq | EXTENDED_RELEASE_TABLET | Freq: Once | ORAL | Status: AC
  Administered 2023-08-12: 40 meq via ORAL
  Filled 2023-08-12: qty 2

## 2023-08-12 NOTE — Progress Notes (Signed)
Patient converted back into A-Fib at 1739 after ambulating to BR. Pt asymptomatic, VSS. EKG done and MD Gherghe notified. HR currently 94.

## 2023-08-12 NOTE — Progress Notes (Signed)
Mobility Specialist - Progress Note  ([place] 3L) Pre-mobility: 84 bpm HR, 96% SpO2 During mobility: 88 bpm HR, 86% SpO2 Post-mobility: 83 bpm HR, 97% SPO2   08/12/23 1323  Mobility  Activity Ambulated with assistance in hallway  Level of Assistance Contact guard assist, steadying assist  Assistive Device Front wheel walker  Distance Ambulated (ft) 50 ft  Range of Motion/Exercises Active Assistive  Activity Response Tolerated fair  Mobility visit 1 Mobility  Mobility Specialist Start Time (ACUTE ONLY) 1300  Mobility Specialist Stop Time (ACUTE ONLY) 1323  Mobility Specialist Time Calculation (min) (ACUTE ONLY) 23 min   Pt was found sitting EOB and agreeable to ambulate. Was a little unsteady during session and stated feeling SOB. At EOS returned to sit EOB with all needs met. Able to increase SPO2 >90% within 1 min. Call bell in reach and RN notified of session.   Billey Chang Mobility Specialist

## 2023-08-12 NOTE — Progress Notes (Signed)
PROGRESS NOTE  Savannah Jimenez EXB:[id number] DOB: [birth year] DOA: 08/10/2023 PCP: Wanda Plump, MD   LOS: 1 day   Brief Narrative / Interim history: 88 year old female with history of PAF on Eliquis and metoprolol, HTN, HLD, comes into the hospital with weakness, shortness of breath, cough productive of yellow sputum.  Apparently she was recently diagnosed with influenza A, and completed a course of Tamiflu.  That made her feel very weak and short of breath.  Over the last 2 days, weakness has gotten much worse, cough has become more productive and she has started to expectorate yellow sputum.  She came into the hospital, and workup revealed leukocytosis as well as multifocal pneumonia.  She was placed on antibiotics and admitted to the hospital.  She was also found to be hypoxic requiring 4 L nasal cannula.  Influenza/COVID all negative at this point  Subjective / 24h Interval events: Her cough is bothering her, occasionally is bringing up yellow sputum.  Still feels weak and short of breath  Assesement and Plan: Principal problem Sepsis due to multifocal pneumonia, acute hypoxic respiratory failure -patient met sepsis criteria on admission with leukocytosis, fever, tachycardia, tachypnea as well as a source.  Was placed on antibiotics, continue for now -On 4 L this morning, continue to wean off as tolerated -Continue antitussives, incentive spirometry, flutter valve and supportive care.  Early mobilization  Active problems PAF, with RVR-she was found to be in A-fib with RVR on admission.  She required diltiazem infusion, but she is converted back to sinus rhythm this morning.  Continue home metoprolol, discontinue diltiazem infusion.  Has been placed on Eliquis  Hyponatremia-with hypochloremia, possibly dehydrated in the setting of acute illness.  Received fluids, encourage p.o. intake, monitor  Hypokalemia-replace potassium and continue to monitor.  Check magnesium  Anemia,  normocytic-possibly in the setting of acute illness as well as receiving IV fluids.  No bleeding.  Monitor  Scheduled Meds:  apixaban  2.5 mg Oral BID   atorvastatin  20 mg Oral QHS   busPIRone  7.5 mg Oral BID   feeding supplement  237 mL Oral BID BM   fluticasone  2 spray Each Nare Daily   metoprolol succinate  12.5 mg Oral Daily   omega-3 acid ethyl esters  2 g Oral Daily   ondansetron  4 mg Oral Q6H   potassium chloride  40 mEq Oral Once   Continuous Infusions:  azithromycin Stopped (08/11/23 1640)   cefTRIAXone (ROCEPHIN)  IV Stopped (08/11/23 1513)   diltiazem (CARDIZEM) infusion 12.5 mg/hr (08/12/23 0725)   PRN Meds:.acetaminophen **OR** acetaminophen, albuterol, guaiFENesin-dextromethorphan, ondansetron **OR** ondansetron (ZOFRAN) IV, zolpidem  Current Outpatient Medications  Medication Instructions   atorvastatin (LIPITOR) 20 mg, Oral, Daily at bedtime   busPIRone (BUSPAR) 7.5 mg, Oral, 2 times daily   Cholecalciferol (VITAMIN D) 50 MCG (2000 UT) CAPS 1 capsule, Oral, Daily   Cyanocobalamin (B-12) 1000 MCG TABS Oral   ELIQUIS 2.5 MG TABS tablet TAKE 1 TABLET BY MOUTH TWICE  DAILY   fish oil-omega-3 fatty acids 2 g, Oral, Daily,     fluticasone (FLONASE) 50 MCG/ACT nasal spray SHAKE WELL AND USE 2 SPRAYS IN EACH NOSTRIL DAILY   metoprolol succinate (TOPROL-XL) 12.5 mg, Oral, Daily   Multiple Vitamins-Minerals (PRESERVISION AREDS 2) CAPS 1 capsule, Oral, 2 times daily   ondansetron (ZOFRAN) 4 mg, Oral, Every 6 hours   oseltamivir (TAMIFLU) 75 mg, Oral, Every 12 hours   zolpidem (AMBIEN) 5 mg, Oral, At bedtime PRN  Diet Orders (From admission, onward)     Start     Ordered   08/11/23 1346  Diet Heart Room service appropriate? Yes; Fluid consistency: Thin  Diet effective now       Question Answer Comment  Room service appropriate? Yes   Fluid consistency: Thin      08/11/23 1346            DVT prophylaxis: SCDs Start: 08/11/23 1346 apixaban (ELIQUIS) tablet  2.5 mg Start: 08/11/23 1015 apixaban (ELIQUIS) tablet 2.5 mg   Lab Results  Component Value Date   PLT 243 08/12/2023      Code Status: Full Code  Family Communication: Called daughter, left voicemail  Status is: Inpatient Remains inpatient appropriate because: IV antibiotics, hypoxia  Level of care: Progressive  Consultants:  None  Objective: Vitals:   08/12/23 0600 08/12/23 0630 08/12/23 0800 08/12/23 0900  BP: 127/61 (!) 116/49 (!) 109/58 (!) 118/59  Pulse: 85 73 72 (!) 109  Resp: (!) 30 (!) 25 19 (!) 29  Temp:    98.7 F (37.1 C)  TempSrc:      SpO2: 92% 91% 97% 91%  Weight:      Height:        Intake/Output Summary (Last 24 hours) at 08/12/2023 0920 Last data filed at 08/12/2023 0900 Gross per 24 hour  Intake 2145.21 ml  Output --  Net 2145.21 ml   Wt Readings from Last 3 Encounters:  08/10/23 46 kg  07/29/23 48.5 kg  05/01/23 50 kg    Examination:  Constitutional: NAD Eyes: no scleral icterus ENMT: Mucous membranes are moist.  Neck: normal, supple Respiratory: Bilateral rhonchi, no wheezing Cardiovascular: Regular rate and rhythm, no murmurs / rubs / gallops. No LE edema.  Abdomen: non distended, no tenderness. Bowel sounds positive.  Musculoskeletal: no clubbing / cyanosis.  Skin: no rashes  Data Reviewed: I have independently reviewed following labs and imaging studies   CBC Recent Labs  Lab 08/10/23 1944 08/11/23 1032 08/12/23 0355  WBC 12.1* 10.2 8.1  HGB 11.4* 10.5* 9.0*  HCT 34.7* 31.9* 28.3*  PLT 311 263 243  MCV 93.8 94.7 98.6  MCH 30.8 31.2 31.4  MCHC 32.9 32.9 31.8  RDW 13.0 12.9 13.0  LYMPHSABS 0.7 0.8  --   MONOABS 0.6 0.6  --   EOSABS 0.0 0.0  --   BASOSABS 0.0 0.0  --     Recent Labs  Lab 08/10/23 1944 08/11/23 1032 08/12/23 0355  NA 130* 132* 133*  K 3.9 3.8 3.4*  CL 95* 96* 97*  CO2 24 28 27   GLUCOSE 118* 109* 102*  BUN 14 8 12   CREATININE 0.45 0.46 0.45  CALCIUM 8.5* 8.3* 8.0*  AST 20 23  --   ALT 18  21  --   ALKPHOS 63 59  --   BILITOT 1.4* 1.1  --   ALBUMIN 3.2* 2.7*  --   LATICACIDVEN 1.0  --   --   INR 1.2  --   --     ------------------------------------------------------------------------------------------------------------------ No results for input(s): "CHOL", "HDL", "LDLCALC", "TRIG", "CHOLHDL", "LDLDIRECT" in the last 72 hours.  No results found for: "HGBA1C" ------------------------------------------------------------------------------------------------------------------ No results for input(s): "TSH", "T4TOTAL", "T3FREE", "THYROIDAB" in the last 72 hours.  Invalid input(s): "FREET3"  Cardiac Enzymes No results for input(s): "CKMB", "TROPONINI", "MYOGLOBIN" in the last 168 hours.  Invalid input(s): "CK" ------------------------------------------------------------------------------------------------------------------    Component Value Date/Time   BNP 65.4 07/29/2023 1552    CBG: No  results for input(s): "GLUCAP" in the last 168 hours.  Recent Results (from the past 240 hours)  Culture, blood (Routine x 2)     Status: None (Preliminary result)   Collection Time: 08/10/23  7:43 PM   Specimen: BLOOD  Result Value Ref Range Status   Specimen Description   Final    BLOOD LEFT ANTECUBITAL Performed at [facility], [address] [place], [place] [address]    Special Requests   Final    BOTTLES DRAWN AEROBIC AND ANAEROBIC Blood Culture adequate volume Performed at [facility], [address] [place], [place] [address]    Culture   Final    NO GROWTH < 12 HOURS Performed at [facility], [address], [place], [place] [address]    Report Status PENDING  Incomplete  Culture, blood (Routine x 2)     Status: None (Preliminary result)   Collection Time: 08/10/23  7:43 PM   Specimen: BLOOD RIGHT FOREARM  Result Value Ref Range Status   Specimen Description   Final    BLOOD RIGHT FOREARM Performed at [facility], [address] [place], [place] [address]    Special Requests   Final    BOTTLES DRAWN AEROBIC AND ANAEROBIC Blood Culture results may not be optimal due to an inadequate volume of blood received in culture bottles Performed at [facility], [address] [place], [place] [address]    Culture   Final    NO GROWTH < 12 HOURS Performed at [facility], [address] [place], [place] [address]    Report Status PENDING  Incomplete  Resp panel by RT-PCR (RSV, Flu A&B, Covid) Anterior Nasal Swab     Status: None   Collection Time: 08/10/23  7:44 PM   Specimen: Anterior Nasal Swab  Result Value Ref Range Status   SARS Coronavirus 2 by RT PCR NEGATIVE NEGATIVE Final    Comment: (NOTE) SARS-CoV-2 target nucleic acids are NOT DETECTED.  The SARS-CoV-2 RNA is generally detectable in upper respiratory specimens during the acute phase of infection. The lowest concentration of SARS-CoV-2 viral copies this assay can detect is 138 copies/mL. A negative result does not preclude SARS-Cov-2 infection and should not be used as the sole basis for treatment or other patient management decisions. A negative result may occur with  improper specimen collection/handling, submission of specimen other than nasopharyngeal swab, presence of viral mutation(s) within the areas targeted by this assay, and inadequate number of viral copies(<138 copies/mL). A negative result must be combined with clinical observations, patient history, and epidemiological information. The expected result is Negative.  Fact Sheet for Patients:  BloggerCourse.com  Fact Sheet for Healthcare Providers:  SeriousBroker.it  This test is no t yet approved or cleared by the [place] FDA and  has been authorized for detection and/or diagnosis of SARS-CoV-2 by FDA under an Emergency Use Authorization (EUA). This EUA will remain  in effect (meaning this test can be  used) for the duration of the COVID-19 declaration under Section 564(b)(1) of the Act, 21 U.S.C.section 360bbb-3(b)(1), unless the authorization is terminated  or revoked sooner.       Influenza A by PCR NEGATIVE NEGATIVE Final   Influenza B by PCR NEGATIVE NEGATIVE Final    Comment: (NOTE) The Xpert Xpress SARS-CoV-2/FLU/RSV plus assay is intended as an aid in the diagnosis of influenza from Nasopharyngeal swab specimens and should not  be used as a sole basis for treatment. Nasal washings and aspirates are unacceptable for Xpert Xpress SARS-CoV-2/FLU/RSV testing.  Fact Sheet for Patients: BloggerCourse.com  Fact Sheet for Healthcare Providers: SeriousBroker.it  This test is not yet approved or cleared by the [place] FDA and has been authorized for detection and/or diagnosis of SARS-CoV-2 by FDA under an Emergency Use Authorization (EUA). This EUA will remain in effect (meaning this test can be used) for the duration of the COVID-19 declaration under Section 564(b)(1) of the Act, 21 U.S.C. section 360bbb-3(b)(1), unless the authorization is terminated or revoked.     Resp Syncytial Virus by PCR NEGATIVE NEGATIVE Final    Comment: (NOTE) Fact Sheet for Patients: BloggerCourse.com  Fact Sheet for Healthcare Providers: SeriousBroker.it  This test is not yet approved or cleared by the [place] FDA and has been authorized for detection and/or diagnosis of SARS-CoV-2 by FDA under an Emergency Use Authorization (EUA). This EUA will remain in effect (meaning this test can be used) for the duration of the COVID-19 declaration under Section 564(b)(1) of the Act, 21 U.S.C. section 360bbb-3(b)(1), unless the authorization is terminated or revoked.  Performed at [facility], [address] [place], [place] [address]   MRSA Next Gen by PCR, Nasal     Status:  None   Collection Time: 08/11/23  4:56 PM   Specimen: Nasal Mucosa; Nasal Swab  Result Value Ref Range Status   MRSA by PCR Next Gen NOT DETECTED NOT DETECTED Final    Comment: (NOTE) The GeneXpert MRSA Assay (FDA approved for NASAL specimens only), is one component of a comprehensive MRSA colonization surveillance program. It is not intended to diagnose MRSA infection nor to guide or monitor treatment for MRSA infections. Test performance is not FDA approved in patients less than 75 years old. Performed at [facility], [address], [place], [place] [address]      Radiology Studies: No results found.   Pamella Pert, MD, PhD [facility]  Between 7 am - 7 pm I am available, please contact me via Amion (for emergencies) or Securechat (non urgent messages)  Between 7 pm - 7 am I am not available, please contact night coverage MD/APP via Amion

## 2023-08-12 NOTE — Progress Notes (Signed)
Pt converted back to NSR around 1910. EKG done to confirm and placed in chart. Chinita Greenland, NP made aware. Due to soft BP's, cardizem gtt rate decreased by 2.5 mg/hr per Chinita Greenland, NP. Pt tolerating rate dose change well with HR stable sustaining in the 70s and blood pressures within order parameters for gtt.

## 2023-08-12 NOTE — Progress Notes (Signed)
Cardizem drip stopped at 1208 per MD Gherghe. Telemetry made aware. Patient remains in NSR, will continue to monitor closely.

## 2023-08-13 DIAGNOSIS — J189 Pneumonia, unspecified organism: Secondary | ICD-10-CM | POA: Diagnosis not present

## 2023-08-13 LAB — CBC
HCT: 31.5 % — ABNORMAL LOW (ref 36.0–46.0)
Hemoglobin: 9.8 g/dL — ABNORMAL LOW (ref 12.0–15.0)
MCH: 30.8 pg (ref 26.0–34.0)
MCHC: 31.1 g/dL (ref 30.0–36.0)
MCV: 99.1 fL (ref 80.0–100.0)
Platelets: 279 10*3/uL (ref 150–400)
RBC: 3.18 MIL/uL — ABNORMAL LOW (ref 3.87–5.11)
RDW: 12.9 % (ref 11.5–15.5)
WBC: 7.2 10*3/uL (ref 4.0–10.5)
nRBC: 0 % (ref 0.0–0.2)

## 2023-08-13 LAB — COMPREHENSIVE METABOLIC PANEL
ALT: 38 U/L (ref 0–44)
AST: 52 U/L — ABNORMAL HIGH (ref 15–41)
Albumin: 2.4 g/dL — ABNORMAL LOW (ref 3.5–5.0)
Alkaline Phosphatase: 66 U/L (ref 38–126)
Anion gap: 5 (ref 5–15)
BUN: 18 mg/dL (ref 8–23)
CO2: 28 mmol/L (ref 22–32)
Calcium: 8.2 mg/dL — ABNORMAL LOW (ref 8.9–10.3)
Chloride: 102 mmol/L (ref 98–111)
Creatinine, Ser: 0.68 mg/dL (ref 0.44–1.00)
GFR, Estimated: >60 mL/min (ref >60)
Glucose, Bld: 100 mg/dL — ABNORMAL HIGH (ref 70–99)
Potassium: 4.8 mmol/L (ref 3.5–5.1)
Sodium: 135 mmol/L (ref 135–145)
Total Bilirubin: 0.6 mg/dL (ref 0.0–1.2)
Total Protein: 6 g/dL — ABNORMAL LOW (ref 6.5–8.1)

## 2023-08-13 LAB — PHOSPHORUS: Phosphorus: 2.1 mg/dL — ABNORMAL LOW (ref 2.5–4.6)

## 2023-08-13 LAB — MAGNESIUM: Magnesium: 2.1 mg/dL (ref 1.7–2.4)

## 2023-08-13 MED ORDER — SODIUM PHOSPHATES 45 MMOLE/15ML IV SOLN
15.0000 mmol | Freq: Once | INTRAVENOUS | Status: AC
  Administered 2023-08-13: 15 mmol via INTRAVENOUS
  Filled 2023-08-13: qty 5

## 2023-08-13 MED ORDER — METOPROLOL TARTRATE 5 MG/5ML IV SOLN
2.5000 mg | INTRAVENOUS | Status: DC | PRN
  Administered 2023-08-13: 2.5 mg via INTRAVENOUS
  Filled 2023-08-13: qty 5

## 2023-08-13 MED ORDER — METOPROLOL SUCCINATE ER 25 MG PO TB24
25.0000 mg | ORAL_TABLET | Freq: Every day | ORAL | Status: DC
  Administered 2023-08-13 – 2023-08-16 (×4): 25 mg via ORAL
  Filled 2023-08-13 (×4): qty 1

## 2023-08-13 NOTE — TOC Initial Note (Signed)
Transition of Care [facility]) - Initial/Assessment Note   Patient Details  Name: Savannah Jimenez MRN: [id number] Date of Birth: [birth year]  Transition of Care [facility]) CM/SW Contact:    Ewing Schlein, LCSW Phone Number: 08/13/2023, 3:20 PM  Clinical Narrative: Patient is from home and is currently on 3L/min oxygen. PT evaluation recommended HH. CSW met with patient to discuss recommendations. Patient is agreeable to [facility] referral and does not have an agency preference. Patient aware that if she still needs oxygen at discharge, home oxygen will also need to be set up. CSW made [facility] referral to Cindie with [facility], which was accepted. CSW updated patient. TOC to follow.              Expected Discharge Plan: Home w Home Health Services Barriers to Discharge: Continued Medical Work up  Patient Goals and CMS Choice Patient states their goals for this hospitalization and ongoing recovery are:: Return home CMS Medicare.gov Compare Post Acute Care list provided to:: Patient Choice offered to / list presented to : Patient  Expected Discharge Plan and Services Post Acute Care Choice: Home Health Living arrangements for the past 2 months: Single Family Home HH Arranged: PT HH Agency: [facility] Date [facility] Agency Contacted: 08/13/23 Time HH Agency Contacted: [phone] Representative spoke with at [facility] Agency: Cindie  Prior Living Arrangements/Services Living arrangements for the past 2 months: Single Family Home Lives with:: Spouse Patient language and need for interpreter reviewed:: Yes Do you feel safe going back to the place where you live?: Yes      Need for Family Participation in Patient Care: No (Comment) Care giver support system in place?: Yes (comment) Criminal Activity/Legal Involvement Pertinent to Current Situation/Hospitalization: No - Comment as needed  Activities of Daily Living ADL Screening (condition at time of admission) Independently performs ADLs?: Yes (appropriate for  developmental age) Is the patient deaf or have difficulty hearing?: Yes Does the patient have difficulty seeing, even when wearing glasses/contacts?: No Does the patient have difficulty concentrating, remembering, or making decisions?: No  Permission Sought/Granted Permission sought to share information with : Other (comment) Permission granted to share information with : Yes, Verbal Permission Granted Permission granted to share info w AGENCY: HH agencies  Emotional Assessment Appearance:: Appears stated age Attitude/Demeanor/Rapport: Engaged, Gracious Affect (typically observed): Accepting Orientation: : Oriented to Self, Oriented to Place, Oriented to  Time, Oriented to Situation Alcohol / Substance Use: Not Applicable Psych Involvement: No (comment)  Admission diagnosis:  Multifocal pneumonia [J18.9] Sepsis, due to unspecified organism, unspecified whether acute organ dysfunction present [facility]) [A41.9] Patient Active Problem List   Diagnosis Date Noted   Multifocal pneumonia 08/10/2023   Lung nodule 05/27/2022   Paroxysmal atrial flutter (HCC) 11/22/2021   Hyperlipidemia 11/22/2021   Orthostatic hypotension 11/22/2021   Coronary artery disease 11/22/2021   Anxiety 07/09/2021   BCC (basal cell carcinoma of skin) 06/15/2021   Insomnia 06/19/2020   Glaucoma suspect 06/10/2020   Annual physical exam 06/05/2018   Vitamin B 12 deficiency 06/05/2018   PCP NOTES >>>>>>>>>>>>>>> 01/29/2018   Right temporal headache 10/05/2014   Polymyalgia rheumatica (HCC) 05/18/2011   Allergic rhinitis 03/22/2007   GERD 03/22/2007   Osteoporosis 03/22/2007   PCP:  Wanda Plump, MD Pharmacy:   [facility] [phone] [place] Spice, [place] - [address] AT [facility] [address] [place]  [address] Phone: [phone] Fax: [phone]  [facility] - [place], [place] -  [address] [place] [address] Phone:  [phone] Fax: [phone]  Social Drivers of Health (SDOH) Social History: SDOH Screenings   Food Insecurity: No Food Insecurity (08/11/2023)  Housing: Low Risk  (08/11/2023)  Transportation Needs: No Transportation Needs (08/11/2023)  Utilities: Not At Risk (08/11/2023)  Alcohol Screen: Low Risk  (11/15/2022)  Depression (PHQ2-9): Low Risk  (05/01/2023)  Financial Resource Strain: Low Risk  (11/15/2022)  Physical Activity: Insufficiently Active (11/15/2022)  Social Connections: Moderately Isolated (08/11/2023)  Stress: No Stress Concern Present (11/15/2022)  Tobacco Use: Low Risk  (08/10/2023)   SDOH Interventions:    Readmission Risk Interventions     No data to display

## 2023-08-13 NOTE — Progress Notes (Signed)
PROGRESS NOTE  Savannah Jimenez:[id number] DOB: Aug 22, 1935 DOA: 08/10/2023 PCP: Wanda Plump, MD   LOS: 2 days   Brief Narrative / Interim history: 88 year old female with history of PAF on Eliquis and metoprolol, HTN, HLD, comes into the hospital with weakness, shortness of breath, cough productive of yellow sputum.  Apparently she was recently diagnosed with influenza A, and completed a course of Tamiflu.  That made her feel very weak and short of breath.  Over the last 2 days, weakness has gotten much worse, cough has become more productive and she has started to expectorate yellow sputum.  She came into the hospital, and workup revealed leukocytosis as well as multifocal pneumonia.  She was placed on antibiotics and admitted to the hospital.  She was also found to be hypoxic requiring 4 L nasal cannula.  Influenza/COVID all negative at this point  Subjective / 24h Interval events: States that her breathing is well, however has persistent productive cough which is the main thing that has been bothering her  Assesement and Plan: Principal problem Sepsis due to multifocal pneumonia, acute hypoxic respiratory failure -patient met sepsis criteria on admission with leukocytosis, fever, tachycardia, tachypnea as well as a source.  Was placed on antibiotics, continue for now -Improved oxygenation, on 3 L this morning, continue to wean off to room air as tolerated -Continue antitussives, incentive spirometry, flutter valve and supportive care.  Early mobilization, working with PT today  Active problems PAF, with RVR-she was found to be in A-fib with RVR on admission.  She required diltiazem infusion, and eventually switched to metoprolol -Converted to sinus but now back in A-fib, rates up to 130s with mobilization, increase metoprolol dose today -Continue anticoagulation with Eliquis  Hyponatremia-with hypochloremia, possibly dehydrated in the setting of acute illness.  Normal lites after  fluids  Hypokalemia-potassium normal today, magnesium normal  Anemia, normocytic-possibly in the setting of acute illness as well as receiving IV fluids.  No bleeding.  Monitor  Scheduled Meds:  apixaban  2.5 mg Oral BID   atorvastatin  20 mg Oral QHS   busPIRone  7.5 mg Oral BID   feeding supplement  237 mL Oral BID BM   fluticasone  2 spray Each Nare Daily   metoprolol succinate  25 mg Oral Daily   omega-3 acid ethyl esters  2 g Oral Daily   ondansetron  4 mg Oral Q6H   Continuous Infusions:  azithromycin 500 mg (08/12/23 1558)   cefTRIAXone (ROCEPHIN)  IV 2 g (08/12/23 1518)   sodium phosphate 15 mmol in dextrose 5 % 250 mL infusion     PRN Meds:.acetaminophen **OR** acetaminophen, albuterol, guaiFENesin-dextromethorphan, ondansetron **OR** ondansetron (ZOFRAN) IV, zolpidem  Current Outpatient Medications  Medication Instructions   atorvastatin (LIPITOR) 20 mg, Oral, Daily at bedtime   busPIRone (BUSPAR) 7.5 mg, Oral, 2 times daily   Cholecalciferol (VITAMIN D) 50 MCG (2000 UT) CAPS 1 capsule, Oral, Daily   Cyanocobalamin (B-12) 1000 MCG TABS Oral   ELIQUIS 2.5 MG TABS tablet TAKE 1 TABLET BY MOUTH TWICE  DAILY   fish oil-omega-3 fatty acids 2 g, Oral, Daily,     fluticasone (FLONASE) 50 MCG/ACT nasal spray SHAKE WELL AND USE 2 SPRAYS IN EACH NOSTRIL DAILY   metoprolol succinate (TOPROL-XL) 12.5 mg, Oral, Daily   Multiple Vitamins-Minerals (PRESERVISION AREDS 2) CAPS 1 capsule, Oral, 2 times daily   ondansetron (ZOFRAN) 4 mg, Oral, Every 6 hours   oseltamivir (TAMIFLU) 75 mg, Oral, Every 12 hours  zolpidem (AMBIEN) 5 mg, Oral, At bedtime PRN    Diet Orders (From admission, onward)     Start     Ordered   08/11/23 1346  Diet Heart Room service appropriate? Yes; Fluid consistency: Thin  Diet effective now       Question Answer Comment  Room service appropriate? Yes   Fluid consistency: Thin      08/11/23 1346            DVT prophylaxis: SCDs Start: 08/11/23  1346 apixaban (ELIQUIS) tablet 2.5 mg Start: 08/11/23 1015 apixaban (ELIQUIS) tablet 2.5 mg   Lab Results  Component Value Date   PLT 279 08/13/2023      Code Status: Full Code  Family Communication: Called daughter, left voicemail  Status is: Inpatient Remains inpatient appropriate because: IV antibiotics, hypoxia  Level of care: Progressive  Consultants:  None  Objective: Vitals:   08/12/23 1800 08/12/23 1950 08/13/23 0302 08/13/23 0900  BP: (!) 109/52 (!) 116/56 123/67 (!) 111/51  Pulse: 95 73 (!) 106 (!) 101  Resp: (!) 22  (!) 24 20  Temp:  99.3 F (37.4 C) 98.9 F (37.2 C)   TempSrc:  Oral Oral   SpO2: 96%  93% 95%  Weight:      Height:        Intake/Output Summary (Last 24 hours) at 08/13/2023 1107 Last data filed at 08/13/2023 1000 Gross per 24 hour  Intake 370 ml  Output --  Net 370 ml   Wt Readings from Last 3 Encounters:  08/10/23 46 kg  07/29/23 48.5 kg  05/01/23 50 kg    Examination:  Constitutional: NAD Eyes: lids and conjunctivae normal, no scleral icterus ENMT: mmm Neck: normal, supple Respiratory: Bibasilar rhonchi, no wheezing Cardiovascular: Regular rate and rhythm, no murmurs / rubs / gallops. No LE edema. Abdomen: soft, no distention, no tenderness. Bowel sounds positive.   Data Reviewed: I have independently reviewed following labs and imaging studies   CBC Recent Labs  Lab 08/10/23 1944 08/11/23 1032 08/12/23 0355 08/13/23 0324  WBC 12.1* 10.2 8.1 7.2  HGB 11.4* 10.5* 9.0* 9.8*  HCT 34.7* 31.9* 28.3* 31.5*  PLT 311 263 243 279  MCV 93.8 94.7 98.6 99.1  MCH 30.8 31.2 31.4 30.8  MCHC 32.9 32.9 31.8 31.1  RDW 13.0 12.9 13.0 12.9  LYMPHSABS 0.7 0.8  --   --   MONOABS 0.6 0.6  --   --   EOSABS 0.0 0.0  --   --   BASOSABS 0.0 0.0  --   --     Recent Labs  Lab 08/10/23 1944 08/11/23 1032 08/12/23 0355 08/13/23 0324  NA 130* 132* 133* 135  K 3.9 3.8 3.4* 4.8  CL 95* 96* 97* 102  CO2 24 28 27 28   GLUCOSE 118* 109*  102* 100*  BUN 14 8 12 18   CREATININE 0.45 0.46 0.45 0.68  CALCIUM 8.5* 8.3* 8.0* 8.2*  AST 20 23  --  52*  ALT 18 21  --  38  ALKPHOS 63 59  --  66  BILITOT 1.4* 1.1  --  0.6  ALBUMIN 3.2* 2.7*  --  2.4*  MG  --   --   --  2.1  LATICACIDVEN 1.0  --   --   --   INR 1.2  --   --   --     ------------------------------------------------------------------------------------------------------------------ No results for input(s): "CHOL", "HDL", "LDLCALC", "TRIG", "CHOLHDL", "LDLDIRECT" in the last 72 hours.  No results found for: "HGBA1C" ------------------------------------------------------------------------------------------------------------------ No results for input(s): "TSH", "T4TOTAL", "T3FREE", "THYROIDAB" in the last 72 hours.  Invalid input(s): "FREET3"  Cardiac Enzymes No results for input(s): "CKMB", "TROPONINI", "MYOGLOBIN" in the last 168 hours.  Invalid input(s): "CK" ------------------------------------------------------------------------------------------------------------------    Component Value Date/Time   BNP 65.4 07/29/2023 1552    CBG: No results for input(s): "GLUCAP" in the last 168 hours.  Recent Results (from the past 240 hours)  Culture, blood (Routine x 2)     Status: None (Preliminary result)   Collection Time: 08/10/23  7:43 PM   Specimen: BLOOD  Result Value Ref Range Status   Specimen Description   Final    BLOOD LEFT ANTECUBITAL Performed at [facility], [address] [place], [place] [address]    Special Requests   Final    BOTTLES DRAWN AEROBIC AND ANAEROBIC Blood Culture adequate volume Performed at [facility], [address] [place], [place] [address]    Culture   Final    NO GROWTH 3 DAYS Performed at [facility], [address], [place], [place] [address]    Report Status PENDING  Incomplete  Culture, blood (Routine x 2)     Status: None (Preliminary result)   Collection Time: 08/10/23  7:43  PM   Specimen: BLOOD RIGHT FOREARM  Result Value Ref Range Status   Specimen Description   Final    BLOOD RIGHT FOREARM Performed at [facility], [address] [place], [place] [address]    Special Requests   Final    BOTTLES DRAWN AEROBIC AND ANAEROBIC Blood Culture results may not be optimal due to an inadequate volume of blood received in culture bottles Performed at [facility], [address] [place], [place] [address]    Culture   Final    NO GROWTH 3 DAYS Performed at [facility], [address] [place], [place] [address]    Report Status PENDING  Incomplete  Resp panel by RT-PCR (RSV, Flu A&B, Covid) Anterior Nasal Swab     Status: None   Collection Time: 08/10/23  7:44 PM   Specimen: Anterior Nasal Swab  Result Value Ref Range Status   SARS Coronavirus 2 by RT PCR NEGATIVE NEGATIVE Final    Comment: (NOTE) SARS-CoV-2 target nucleic acids are NOT DETECTED.  The SARS-CoV-2 RNA is generally detectable in upper respiratory specimens during the acute phase of infection. The lowest concentration of SARS-CoV-2 viral copies this assay can detect is 138 copies/mL. A negative result does not preclude SARS-Cov-2 infection and should not be used as the sole basis for treatment or other patient management decisions. A negative result may occur with  improper specimen collection/handling, submission of specimen other than nasopharyngeal swab, presence of viral mutation(s) within the areas targeted by this assay, and inadequate number of viral copies(<138 copies/mL). A negative result must be combined with clinical observations, patient history, and epidemiological information. The expected result is Negative.  Fact Sheet for Patients:  BloggerCourse.com  Fact Sheet for Healthcare Providers:  SeriousBroker.it  This test is no t yet approved or cleared by the [place] FDA and  has been  authorized for detection and/or diagnosis of SARS-CoV-2 by FDA under an Emergency Use Authorization (EUA). This EUA will remain  in effect (meaning this test can be used) for the duration of the COVID-19 declaration under Section 564(b)(1) of the Act, 21 U.S.C.section 360bbb-3(b)(1), unless  the authorization is terminated  or revoked sooner.       Influenza A by PCR NEGATIVE NEGATIVE Final   Influenza B by PCR NEGATIVE NEGATIVE Final    Comment: (NOTE) The Xpert Xpress SARS-CoV-2/FLU/RSV plus assay is intended as an aid in the diagnosis of influenza from Nasopharyngeal swab specimens and should not be used as a sole basis for treatment. Nasal washings and aspirates are unacceptable for Xpert Xpress SARS-CoV-2/FLU/RSV testing.  Fact Sheet for Patients: BloggerCourse.com  Fact Sheet for Healthcare Providers: SeriousBroker.it  This test is not yet approved or cleared by the [place] FDA and has been authorized for detection and/or diagnosis of SARS-CoV-2 by FDA under an Emergency Use Authorization (EUA). This EUA will remain in effect (meaning this test can be used) for the duration of the COVID-19 declaration under Section 564(b)(1) of the Act, 21 U.S.C. section 360bbb-3(b)(1), unless the authorization is terminated or revoked.     Resp Syncytial Virus by PCR NEGATIVE NEGATIVE Final    Comment: (NOTE) Fact Sheet for Patients: BloggerCourse.com  Fact Sheet for Healthcare Providers: SeriousBroker.it  This test is not yet approved or cleared by the [place] FDA and has been authorized for detection and/or diagnosis of SARS-CoV-2 by FDA under an Emergency Use Authorization (EUA). This EUA will remain in effect (meaning this test can be used) for the duration of the COVID-19 declaration under Section 564(b)(1) of the Act, 21 U.S.C. section 360bbb-3(b)(1), unless the  authorization is terminated or revoked.  Performed at [facility], [address] [place], [place] [address]   MRSA Next Gen by PCR, Nasal     Status: None   Collection Time: 08/11/23  4:56 PM   Specimen: Nasal Mucosa; Nasal Swab  Result Value Ref Range Status   MRSA by PCR Next Gen NOT DETECTED NOT DETECTED Final    Comment: (NOTE) The GeneXpert MRSA Assay (FDA approved for NASAL specimens only), is one component of a comprehensive MRSA colonization surveillance program. It is not intended to diagnose MRSA infection nor to guide or monitor treatment for MRSA infections. Test performance is not FDA approved in patients less than 28 years old. Performed at [facility], [address] [place], [place] [address]      Radiology Studies: No results found.   Pamella Pert, MD, PhD [facility]  Between 7 am - 7 pm I am available, please contact me via Amion (for emergencies) or Securechat (non urgent messages)  Between 7 pm - 7 am I am not available, please contact night coverage MD/APP via Amion

## 2023-08-13 NOTE — Evaluation (Signed)
Physical Therapy Evaluation Patient Details Name: Savannah Jimenez MRN: [id number] DOB: 08/31/1935 Today's Date: 08/13/2023  History of Present Illness  KRISTY FEIJOO is a 88 y.o. female with recent influenza A infection now being admitted to the hospital with sepsis due to postviral pneumonia.   PMH: GERD, osteoporosis, paroxysmal afib  Clinical Impression  Pt admitted with above diagnosis. Pt from home, single level with 3 steps to enter, ind with ambulation and ADLs/IADLs, reports got a rollator ~week ago and is using due to unsteadiness and weakness, denies falls. 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 On eval, pt needing min A to power up to stand, slow to rise and extend bil knees. Pt amb with RW, slow gait, minimal bil foot clearance, 1 LOB needing min A to recover, dyspnea noted on 3L O2 with SpO2 >92% and HR 80s-90s with pt denying chest pain, lightheadedness or pain. Pt returns to room and remains up in recliner, coughing intermittent during session with productive cough at times. Anticipate HHPT at d/c. Pt currently with functional limitations due to the deficits listed below (see PT Problem List). Pt will benefit from acute skilled PT to increase their independence and safety with mobility to allow discharge.           If plan is discharge home, recommend the following: A little help with walking and/or transfers;A little help with bathing/dressing/bathroom;Assistance with cooking/housework;Assist for transportation;Help with stairs or ramp for entrance   Can travel by private vehicle        Equipment Recommendations None recommended by PT  Recommendations for Other Services       Functional Status Assessment Patient has had a recent decline in their functional status and demonstrates the ability to make significant improvements in function in a reasonable and predictable amount of time.      Precautions / Restrictions Precautions Precautions: Fall Precaution Comments: monitor SpO2 and HR Restrictions Weight Bearing Restrictions Per Provider Order: No      Mobility  Bed Mobility Overal bed mobility: Modified Independent             General bed mobility comments: increased time and effort, no physical assistance    Transfers Overall transfer level: Needs assistance Equipment used: Rolling walker (2 wheels) Transfers: Sit to/from Stand Sit to Stand: Min assist           General transfer comment: min A to power up to standing, slow to rise, verbal cues to push from seated surface, bil knees slow to extend when powering to stand    Ambulation/Gait Ambulation/Gait assistance: Min assist, Contact guard assist Gait Distance (Feet): 80 Feet Assistive device: Rolling walker (2 wheels) Gait Pattern/deviations: Step-through pattern, Decreased stride length Gait velocity: decreased     General Gait Details: step through gait pattern, equal bil step length, minimal bil foot clearance, decreased cadence, 1 LOB with turning needing min A to recover and prevent fall, CGA for straightline gait, HR 80s-90s and SpO2 >92% on 3L with dyspnea noted  Stairs            Wheelchair Mobility     Tilt Bed    Modified Rankin (Stroke Patients Only)       Balance Overall balance assessment: Mild deficits observed, not formally tested  Pertinent Vitals/Pain Pain Assessment Pain Assessment: No/denies pain    Home Living Family/patient expects to be discharged to:: Private residence Living Arrangements: Spouse/significant other Available Help at Discharge: Family Type of Home: House Home Access: Stairs to enter Entrance Stairs-Rails: Right Entrance Stairs-Number of Steps: 3 with 1 rail in garage, 2 rails at front door   Home Layout: One level Home Equipment: Rollator (4 wheels)      Prior Function  Prior Level of Function : Independent/Modified Independent             Mobility Comments: pt reports ind with community amb, got a rollator 1 week ago, assisting spouse who has macular degeneration ADLs Comments: pt reports ind with self care, completes household chores, assisting spouse who has macular degeneration     Extremity/Trunk Assessment   Upper Extremity Assessment Upper Extremity Assessment: Overall WFL for tasks assessed    Lower Extremity Assessment Lower Extremity Assessment: Generalized weakness (AROM WFL, strength grossly 3+/5, denies numbness/tingling throughout)    Cervical / Trunk Assessment Cervical / Trunk Assessment: Kyphotic  Communication   Communication Communication: No apparent difficulties  Cognition Arousal: Alert Behavior During Therapy: WFL for tasks assessed/performed Overall Cognitive Status: Within Functional Limits for tasks assessed                                          General Comments General comments (skin integrity, edema, etc.): Pt on 3L with SpO2 >92% and HR 80s-90s with ambulation    Exercises     Assessment/Plan    PT Assessment Patient needs continued PT services  PT Problem List Decreased strength;Decreased activity tolerance;Decreased balance;Decreased knowledge of use of DME;Cardiopulmonary status limiting activity       PT Treatment Interventions DME instruction;Gait training;Functional mobility training;Therapeutic activities;Therapeutic exercise;Balance training;Cognitive remediation;Patient/family education    PT Goals (Current goals can be found in the Care Plan section)  Acute Rehab PT Goals Patient Stated Goal: regain strength, independence PT Goal Formulation: With patient Time For Goal Achievement: 08/27/23 Potential to Achieve Goals: Good    Frequency Min 1X/week     Co-evaluation               AM-PAC PT "6 Clicks" Mobility  Outcome Measure Help needed turning from your  back to your side while in a flat bed without using bedrails?: A Little Help needed moving from lying on your back to sitting on the side of a flat bed without using bedrails?: A Little Help needed moving to and from a bed to a chair (including a wheelchair)?: A Little Help needed standing up from a chair using your arms (e.g., wheelchair or bedside chair)?: A Little Help needed to walk in hospital room?: A Little Help needed climbing 3-5 steps with a railing? : A Lot 6 Click Score: 17    End of Session Equipment Utilized During Treatment: Gait belt;Oxygen Activity Tolerance: Patient tolerated treatment well Patient left: in chair;with call bell/phone within reach Nurse Communication: Mobility status;Other (comment) (SpO2, HR) PT Visit Diagnosis: Unsteadiness on feet (R26.81);Muscle weakness (generalized) (M62.81)    Time: 6578-4696 PT Time Calculation (min) (ACUTE ONLY): 23 min   Charges:   PT Evaluation $PT Eval Low Complexity: 1 Low PT Treatments $Gait Training: 8-22 mins PT General Charges $$ ACUTE PT VISIT: 1 Visit         Tori Jianni Batten PT, DPT 08/13/23, 10:36 AM

## 2023-08-14 DIAGNOSIS — J189 Pneumonia, unspecified organism: Secondary | ICD-10-CM | POA: Diagnosis not present

## 2023-08-14 LAB — MAGNESIUM: Magnesium: 2 mg/dL (ref 1.7–2.4)

## 2023-08-14 LAB — BASIC METABOLIC PANEL
Anion gap: 3 — ABNORMAL LOW (ref 5–15)
BUN: 13 mg/dL (ref 8–23)
CO2: 31 mmol/L (ref 22–32)
Calcium: 7.5 mg/dL — ABNORMAL LOW (ref 8.9–10.3)
Chloride: 97 mmol/L — ABNORMAL LOW (ref 98–111)
Creatinine, Ser: 0.5 mg/dL (ref 0.44–1.00)
GFR, Estimated: >60 mL/min (ref >60)
Glucose, Bld: 94 mg/dL (ref 70–99)
Potassium: 3.8 mmol/L (ref 3.5–5.1)
Sodium: 131 mmol/L — ABNORMAL LOW (ref 135–145)

## 2023-08-14 LAB — PHOSPHORUS: Phosphorus: 2 mg/dL — ABNORMAL LOW (ref 2.5–4.6)

## 2023-08-14 MED ORDER — SODIUM PHOSPHATES 45 MMOLE/15ML IV SOLN
30.0000 mmol | Freq: Once | INTRAVENOUS | Status: AC
  Administered 2023-08-14: 30 mmol via INTRAVENOUS
  Filled 2023-08-14: qty 10

## 2023-08-14 NOTE — TOC Progression Note (Signed)
Transition of Care [facility]) - Progression Note   Patient Details  Name: Savannah Jimenez MRN: [id number] Date of Birth: 10-Jan-1936  Transition of Care [facility]) CM/SW Contact  Ewing Schlein, LCSW Phone Number: 08/14/2023, 1:59 PM  Clinical Narrative: CSW received call from patient's daughter, Zella Ball. CSW provided update regarding patient being set up with [facility] with [facility] and possibly needing home oxygen at discharge. Per daughter, she is trying to get the patient into [facility] and asked if patient will be able to live independently in that facility. CSW explained that PT/OT are assessing the patient for PT/OT follow up and whether or not the patient can live at [facility] would be determined by following up with the facility. Daughter to follow up with [facility].  Expected Discharge Plan: Home w Home Health Services Barriers to Discharge: Continued Medical Work up  Expected Discharge Plan and Services Post Acute Care Choice: Home Health Living arrangements for the past 2 months: Single Family Home HH Arranged: PT HH Agency: [facility] Date [facility] Agency Contacted: 08/13/23 Time HH Agency Contacted: [phone] Representative spoke with at [facility] Agency: Cindie  Social Determinants of Health (SDOH) Interventions SDOH Screenings   Food Insecurity: No Food Insecurity (08/11/2023)  Housing: Low Risk  (08/11/2023)  Transportation Needs: No Transportation Needs (08/11/2023)  Utilities: Not At Risk (08/11/2023)  Alcohol Screen: Low Risk  (11/15/2022)  Depression (PHQ2-9): Low Risk  (05/01/2023)  Financial Resource Strain: Low Risk  (11/15/2022)  Physical Activity: Insufficiently Active (11/15/2022)  Social Connections: Moderately Isolated (08/11/2023)  Stress: No Stress Concern Present (11/15/2022)  Tobacco Use: Low Risk  (08/10/2023)   Readmission Risk Interventions     No data to display

## 2023-08-14 NOTE — Evaluation (Signed)
Occupational Therapy Evaluation Patient Details Name: Savannah Jimenez MRN: [id number] DOB: 03/20/1936 Today's Date: 08/14/2023   History of Present Illness Savannah Jimenez is a 88 y.o. female with recent influenza A infection now being admitted to the hospital with sepsis due to postviral pneumonia.   PMH: GERD, osteoporosis, paroxysmal afib   Clinical Impression   Pt presents with decline in function and safety with ADLs and ADL mobility with impaired strength, balance and endurance.  PTA pt was Ind with ADLs/selfcare, cooking, home mgt, drives and mobility using a rollater.  Pt also reports that she cares for her elderly spouse who has macular degeneration and they are looking to move into [facility] [place]. Pt currently requires min A with LB ADLs, toileting and mobility/transfers; pt on 2L O2 with O2 SATs >95% and HR 80s-102 without c/o chest pain, lightheadedness or pain. Pt would benefit form acute OT services to address impairments to maximize level of function and safety      If plan is discharge home, recommend the following: A little help with bathing/dressing/bathroom;A little help with walking and/or transfers;Assistance with cooking/housework;Assist for transportation;Help with stairs or ramp for entrance    Functional Status Assessment  Patient has had a recent decline in their functional status and demonstrates the ability to make significant improvements in function in a reasonable and predictable amount of time.  Equipment Recommendations  None recommended by OT    Recommendations for Other Services       Precautions / Restrictions Precautions Precautions: Fall Precaution Comments: monitor SpO2 and HR Restrictions Weight Bearing Restrictions Per Provider Order: No      Mobility Bed Mobility Overal bed mobility: Modified Independent             General bed mobility comments: increased time and effort, no physical assistance    Transfers Overall transfer  level: Needs assistance Equipment used: Rolling walker (2 wheels) Transfers: Sit to/from Stand Sit to Stand: Min assist                  Balance Overall balance assessment: Mild deficits observed, not formally tested                                         ADL either performed or assessed with clinical judgement   ADL Overall ADL's : Needs assistance/impaired     Grooming: Wash/dry hands;Wash/dry face;Contact guard assist;Standing   Upper Body Bathing: Supervision/ safety;Sitting   Lower Body Bathing: Minimal assistance   Upper Body Dressing : Supervision/safety;Sitting   Lower Body Dressing: Minimal assistance   Toilet Transfer: Minimal assistance;Ambulation;Rolling walker (2 wheels)   Toileting- Clothing Manipulation and Hygiene: Minimal assistance;Sit to/from stand       Functional mobility during ADLs: Minimal assistance;Rolling walker (2 wheels) General ADL Comments: pt with congested coughing and decreased activity tolerance     Vision Ability to See in Adequate Light: 0 Adequate Patient Visual Report: No change from baseline       Perception         Praxis         Pertinent Vitals/Pain Pain Assessment Pain Assessment: No/denies pain     Extremity/Trunk Assessment Upper Extremity Assessment Upper Extremity Assessment: Overall WFL for tasks assessed   Lower Extremity Assessment Lower Extremity Assessment: Defer to PT evaluation   Cervical / Trunk Assessment Cervical / Trunk Assessment: Kyphotic   Communication Communication  Communication: No apparent difficulties   Cognition Arousal: Alert Behavior During Therapy: WFL for tasks assessed/performed Overall Cognitive Status: Within Functional Limits for tasks assessed                                       General Comments       Exercises     Shoulder Instructions      Home Living Family/patient expects to be discharged to:: Private  residence Living Arrangements: Spouse/significant other Available Help at Discharge: Family Type of Home: House Home Access: Stairs to enter [employer] of Steps: 3 with 1 rail in garage, 2 rails at front door Entrance Stairs-Rails: Right Home Layout: One level     Bathroom Shower/Tub: Producer, television/film/video: Handicapped height     Home Equipment: Rollator (4 wheels);Shower seat          Prior Functioning/Environment Prior Level of Function : Independent/Modified Independent             Mobility Comments: pt reports ind with community amb, got a rollator 1 week ago, assisting spouse who has macular degeneration ADLs Comments: pt reports Ind with self care, completes household chores, assisting spouse who has macular degeneration, drives        OT Problem List: Decreased strength;Impaired balance (sitting and/or standing);Decreased activity tolerance;Decreased knowledge of use of DME or AE      OT Treatment/Interventions: Self-care/ADL training;DME and/or AE instruction;Therapeutic activities;Therapeutic exercise;Patient/family education;Energy conservation    OT Goals(Current goals can be found in the care plan section) Acute Rehab OT Goals Patient Stated Goal: go home OT Goal Formulation: With patient Time For Goal Achievement: 08/28/23 Potential to Achieve Goals: Good ADL Goals Pt Will Perform Grooming: with supervision;with set-up;standing Pt Will Perform Upper Body Bathing: with set-up Pt Will Perform Lower Body Bathing: with contact guard assist;with supervision;sitting/lateral leans;sit to/from stand Pt Will Perform Upper Body Dressing: with set-up;sitting Pt Will Perform Lower Body Dressing: with contact guard assist;with supervision;sit to/from stand Pt Will Transfer to Toilet: with contact guard assist;with supervision;ambulating Pt Will Perform Toileting - Clothing Manipulation and hygiene: with supervision;with modified  independence;sit to/from stand Pt Will Perform Tub/Shower Transfer: with contact guard assist;with supervision;ambulating;shower seat;grab bars  OT Frequency: Min 2X/week    Co-evaluation              AM-PAC OT "6 Clicks" Daily Activity     Outcome Measure Help from another person eating meals?: None Help from another person taking care of personal grooming?: A Little Help from another person toileting, which includes using toliet, bedpan, or urinal?: A Little Help from another person bathing (including washing, rinsing, drying)?: A Little Help from another person to put on and taking off regular upper body clothing?: A Little Help from another person to put on and taking off regular lower body clothing?: A Little 6 Click Score: 19   End of Session Equipment Utilized During Treatment: Gait belt;Rolling walker (2 wheels)  Activity Tolerance: Patient tolerated treatment well Patient left: in bed;with call bell/phone within reach;with bed alarm set  OT Visit Diagnosis: Muscle weakness (generalized) (M62.81);Unsteadiness on feet (R26.81)                Time: 9629-5284 OT Time Calculation (min): 26 min Charges:  OT General Charges $OT Visit: 1 Visit OT Evaluation $OT Eval Low Complexity: 1 Low OT Treatments $Therapeutic Activity: 8-22 mins  Margaretmary Eddy [facility] 08/14/2023, 3:27 PM

## 2023-08-14 NOTE — Progress Notes (Signed)
PROGRESS NOTE  Savannah Jimenez ZOX:[id number] DOB: Feb 25, 1936 DOA: 08/10/2023 PCP: Wanda Plump, MD   LOS: 3 days   Brief Narrative / Interim history: 88 year old female with history of PAF on Eliquis and metoprolol, HTN, HLD, comes into the hospital with weakness, shortness of breath, cough productive of yellow sputum.  Apparently she was recently diagnosed with influenza A, and completed a course of Tamiflu.  That made her feel very weak and short of breath.  Over the last 2 days, weakness has gotten much worse, cough has become more productive and she has started to expectorate yellow sputum.  She came into the hospital, and workup revealed leukocytosis as well as multifocal pneumonia.  She was placed on antibiotics and admitted to the hospital.  She was also found to be hypoxic requiring 4 L nasal cannula.  Influenza/COVID all negative at this point  Subjective / 24h Interval events: She is finally feeling a little bit better today.  Assesement and Plan: Principal problem Sepsis due to multifocal pneumonia, acute hypoxic respiratory failure -patient met sepsis criteria on admission with leukocytosis, fever, tachycardia, tachypnea as well as a source.  Was placed on antibiotics, continue for now -Remains on 3 L, wean off to room air as tolerated -Continue antitussives, incentive spirometry, flutter valve and supportive care.  Continue mobilization, clinically seems to be improving  Active problems PAF, with RVR-she was found to be in A-fib with RVR on admission.  She required diltiazem infusion, and eventually switched to metoprolol -Flipping back and forth between sinus and A-fib with RVR, in sinus this morning, her metoprolol was increased from 12.5 at home to 25 mg here  Hyponatremia-with hypochloremia, possibly dehydrated in the setting of acute illness.  Overall stable  Hypokalemia-potassium normal today, magnesium normal  Hypophosphatemia-replace phosphorus  Anemia,  normocytic-possibly in the setting of acute illness as well as receiving IV fluids.  No bleeding.  Monitor  Scheduled Meds:  apixaban  2.5 mg Oral BID   atorvastatin  20 mg Oral QHS   busPIRone  7.5 mg Oral BID   feeding supplement  237 mL Oral BID BM   fluticasone  2 spray Each Nare Daily   metoprolol succinate  25 mg Oral Daily   omega-3 acid ethyl esters  2 g Oral Daily   ondansetron  4 mg Oral Q6H   Continuous Infusions:  azithromycin 500 mg (08/13/23 1352)   cefTRIAXone (ROCEPHIN)  IV 2 g (08/13/23 1317)   sodium phosphate 30 mmol in dextrose 5 % 250 mL infusion 30 mmol (08/14/23 0849)   PRN Meds:.acetaminophen **OR** acetaminophen, albuterol, guaiFENesin-dextromethorphan, metoprolol tartrate, ondansetron **OR** ondansetron (ZOFRAN) IV, zolpidem  Current Outpatient Medications  Medication Instructions   atorvastatin (LIPITOR) 20 mg, Oral, Daily at bedtime   busPIRone (BUSPAR) 7.5 mg, Oral, 2 times daily   Cholecalciferol (VITAMIN D) 50 MCG (2000 UT) CAPS 1 capsule, Oral, Daily   Cyanocobalamin (B-12) 1000 MCG TABS Oral   ELIQUIS 2.5 MG TABS tablet TAKE 1 TABLET BY MOUTH TWICE  DAILY   fish oil-omega-3 fatty acids 2 g, Oral, Daily,     fluticasone (FLONASE) 50 MCG/ACT nasal spray SHAKE WELL AND USE 2 SPRAYS IN EACH NOSTRIL DAILY   metoprolol succinate (TOPROL-XL) 12.5 mg, Oral, Daily   Multiple Vitamins-Minerals (PRESERVISION AREDS 2) CAPS 1 capsule, Oral, 2 times daily   ondansetron (ZOFRAN) 4 mg, Oral, Every 6 hours   oseltamivir (TAMIFLU) 75 mg, Oral, Every 12 hours   zolpidem (AMBIEN) 5 mg, Oral, At bedtime  PRN    Diet Orders (From admission, onward)     Start     Ordered   08/11/23 1346  Diet Heart Room service appropriate? Yes; Fluid consistency: Thin  Diet effective now       Question Answer Comment  Room service appropriate? Yes   Fluid consistency: Thin      08/11/23 1346            DVT prophylaxis: SCDs Start: 08/11/23 1346 apixaban (ELIQUIS) tablet  2.5 mg Start: 08/11/23 1015 apixaban (ELIQUIS) tablet 2.5 mg   Lab Results  Component Value Date   PLT 279 08/13/2023      Code Status: Full Code  Family Communication: Discussed with daughter over the phone  Status is: Inpatient Remains inpatient appropriate because: IV antibiotics, hypoxia  Level of care: Progressive  Consultants:  None  Objective: Vitals:   08/14/23 0000 08/14/23 0453 08/14/23 0956 08/14/23 1000  BP: 127/76  (!) 116/48 130/68  Pulse: 99     Resp: 19 16 20    Temp: 98.7 F (37.1 C) 98.9 F (37.2 C) 99 F (37.2 C)   TempSrc: Oral Oral Oral   SpO2: 98%  100%   Weight:      Height:        Intake/Output Summary (Last 24 hours) at 08/14/2023 1139 Last data filed at 08/14/2023 0830 Gross per 24 hour  Intake 1179.49 ml  Output --  Net 1179.49 ml   Wt Readings from Last 3 Encounters:  08/10/23 46 kg  07/29/23 48.5 kg  05/01/23 50 kg    Examination:  Constitutional: NAD Eyes: lids and conjunctivae normal, no scleral icterus ENMT: mmm Neck: normal, supple Respiratory: clear to auscultation bilaterally, no wheezing, no crackles. Normal respiratory effort.  Cardiovascular: Regular rate and rhythm, no murmurs / rubs / gallops. No LE edema. Abdomen: soft, no distention, no tenderness. Bowel sounds positive.   Data Reviewed: I have independently reviewed following labs and imaging studies   CBC Recent Labs  Lab 08/10/23 1944 08/11/23 1032 08/12/23 0355 08/13/23 0324  WBC 12.1* 10.2 8.1 7.2  HGB 11.4* 10.5* 9.0* 9.8*  HCT 34.7* 31.9* 28.3* 31.5*  PLT 311 263 243 279  MCV 93.8 94.7 98.6 99.1  MCH 30.8 31.2 31.4 30.8  MCHC 32.9 32.9 31.8 31.1  RDW 13.0 12.9 13.0 12.9  LYMPHSABS 0.7 0.8  --   --   MONOABS 0.6 0.6  --   --   EOSABS 0.0 0.0  --   --   BASOSABS 0.0 0.0  --   --     Recent Labs  Lab 08/10/23 1944 08/11/23 1032 08/12/23 0355 08/13/23 0324 08/14/23 0406  NA 130* 132* 133* 135 131*  K 3.9 3.8 3.4* 4.8 3.8  CL 95* 96*  97* 102 97*  CO2 24 28 27 28 31   GLUCOSE 118* 109* 102* 100* 94  BUN 14 8 12 18 13   CREATININE 0.45 0.46 0.45 0.68 0.50  CALCIUM 8.5* 8.3* 8.0* 8.2* 7.5*  AST 20 23  --  52*  --   ALT 18 21  --  38  --   ALKPHOS 63 59  --  66  --   BILITOT 1.4* 1.1  --  0.6  --   ALBUMIN 3.2* 2.7*  --  2.4*  --   MG  --   --   --  2.1 2.0  LATICACIDVEN 1.0  --   --   --   --   INR 1.2  --   --   --   --     ------------------------------------------------------------------------------------------------------------------  No results for input(s): "CHOL", "HDL", "LDLCALC", "TRIG", "CHOLHDL", "LDLDIRECT" in the last 72 hours.  No results found for: "HGBA1C" ------------------------------------------------------------------------------------------------------------------ No results for input(s): "TSH", "T4TOTAL", "T3FREE", "THYROIDAB" in the last 72 hours.  Invalid input(s): "FREET3"  Cardiac Enzymes No results for input(s): "CKMB", "TROPONINI", "MYOGLOBIN" in the last 168 hours.  Invalid input(s): "CK" ------------------------------------------------------------------------------------------------------------------    Component Value Date/Time   BNP 65.4 07/29/2023 1552    CBG: No results for input(s): "GLUCAP" in the last 168 hours.  Recent Results (from the past 240 hours)  Culture, blood (Routine x 2)     Status: None (Preliminary result)   Collection Time: 08/10/23  7:43 PM   Specimen: BLOOD  Result Value Ref Range Status   Specimen Description   Final    BLOOD LEFT ANTECUBITAL Performed at [facility], [address] [place], [place] [address]    Special Requests   Final    BOTTLES DRAWN AEROBIC AND ANAEROBIC Blood Culture adequate volume Performed at [facility], [address] [place], [place] [address]    Culture   Final    NO GROWTH 4 DAYS Performed at [facility], [address], [place], [place] [address]    Report Status PENDING   Incomplete  Culture, blood (Routine x 2)     Status: None (Preliminary result)   Collection Time: 08/10/23  7:43 PM   Specimen: BLOOD RIGHT FOREARM  Result Value Ref Range Status   Specimen Description   Final    BLOOD RIGHT FOREARM Performed at [facility], [address] [place], [place] [address]    Special Requests   Final    BOTTLES DRAWN AEROBIC AND ANAEROBIC Blood Culture results may not be optimal due to an inadequate volume of blood received in culture bottles Performed at [facility], [address] [place], [place] [address]    Culture   Final    NO GROWTH 4 DAYS Performed at [facility], [address] [place], [place] [address]    Report Status PENDING  Incomplete  Resp panel by RT-PCR (RSV, Flu A&B, Covid) Anterior Nasal Swab     Status: None   Collection Time: 08/10/23  7:44 PM   Specimen: Anterior Nasal Swab  Result Value Ref Range Status   SARS Coronavirus 2 by RT PCR NEGATIVE NEGATIVE Final    Comment: (NOTE) SARS-CoV-2 target nucleic acids are NOT DETECTED.  The SARS-CoV-2 RNA is generally detectable in upper respiratory specimens during the acute phase of infection. The lowest concentration of SARS-CoV-2 viral copies this assay can detect is 138 copies/mL. A negative result does not preclude SARS-Cov-2 infection and should not be used as the sole basis for treatment or other patient management decisions. A negative result may occur with  improper specimen collection/handling, submission of specimen other than nasopharyngeal swab, presence of viral mutation(s) within the areas targeted by this assay, and inadequate number of viral copies(<138 copies/mL). A negative result must be combined with clinical observations, patient history, and epidemiological information. The expected result is Negative.  Fact Sheet for Patients:  BloggerCourse.com  Fact Sheet for Healthcare Providers:   SeriousBroker.it  This test is no t yet approved or cleared by the [place] FDA and  has been authorized for detection and/or diagnosis of SARS-CoV-2 by FDA under an Emergency Use Authorization (EUA). This EUA will remain  in effect (meaning this test can be used) for  the duration of the COVID-19 declaration under Section 564(b)(1) of the Act, 21 U.S.C.section 360bbb-3(b)(1), unless the authorization is terminated  or revoked sooner.       Influenza A by PCR NEGATIVE NEGATIVE Final   Influenza B by PCR NEGATIVE NEGATIVE Final    Comment: (NOTE) The Xpert Xpress SARS-CoV-2/FLU/RSV plus assay is intended as an aid in the diagnosis of influenza from Nasopharyngeal swab specimens and should not be used as a sole basis for treatment. Nasal washings and aspirates are unacceptable for Xpert Xpress SARS-CoV-2/FLU/RSV testing.  Fact Sheet for Patients: BloggerCourse.com  Fact Sheet for Healthcare Providers: SeriousBroker.it  This test is not yet approved or cleared by the [place] FDA and has been authorized for detection and/or diagnosis of SARS-CoV-2 by FDA under an Emergency Use Authorization (EUA). This EUA will remain in effect (meaning this test can be used) for the duration of the COVID-19 declaration under Section 564(b)(1) of the Act, 21 U.S.C. section 360bbb-3(b)(1), unless the authorization is terminated or revoked.     Resp Syncytial Virus by PCR NEGATIVE NEGATIVE Final    Comment: (NOTE) Fact Sheet for Patients: BloggerCourse.com  Fact Sheet for Healthcare Providers: SeriousBroker.it  This test is not yet approved or cleared by the [place] FDA and has been authorized for detection and/or diagnosis of SARS-CoV-2 by FDA under an Emergency Use Authorization (EUA). This EUA will remain in effect (meaning this test can be used) for  the duration of the COVID-19 declaration under Section 564(b)(1) of the Act, 21 U.S.C. section 360bbb-3(b)(1), unless the authorization is terminated or revoked.  Performed at [facility], [address] [place], [place] [address]   MRSA Next Gen by PCR, Nasal     Status: None   Collection Time: 08/11/23  4:56 PM   Specimen: Nasal Mucosa; Nasal Swab  Result Value Ref Range Status   MRSA by PCR Next Gen NOT DETECTED NOT DETECTED Final    Comment: (NOTE) The GeneXpert MRSA Assay (FDA approved for NASAL specimens only), is one component of a comprehensive MRSA colonization surveillance program. It is not intended to diagnose MRSA infection nor to guide or monitor treatment for MRSA infections. Test performance is not FDA approved in patients less than 59 years old. Performed at [facility], [address], [place], [place] [address]      Radiology Studies: No results found.   Pamella Pert, MD, PhD [facility]  Between 7 am - 7 pm I am available, please contact me via Amion (for emergencies) or Securechat (non urgent messages)  Between 7 pm - 7 am I am not available, please contact night coverage MD/APP via Amion

## 2023-08-14 NOTE — Plan of Care (Signed)
  Problem: Clinical Measurements: Goal: Respiratory complications will improve Outcome: Progressing   Problem: Activity: Goal: Risk for activity intolerance will decrease Outcome: Progressing   Problem: Coping: Goal: Level of anxiety will decrease Outcome: Progressing   

## 2023-08-14 NOTE — Plan of Care (Signed)

## 2023-08-15 DIAGNOSIS — J189 Pneumonia, unspecified organism: Secondary | ICD-10-CM | POA: Diagnosis not present

## 2023-08-15 LAB — COMPREHENSIVE METABOLIC PANEL
ALT: 34 U/L (ref 0–44)
AST: 38 U/L (ref 15–41)
Albumin: 2.3 g/dL — ABNORMAL LOW (ref 3.5–5.0)
Alkaline Phosphatase: 57 U/L (ref 38–126)
Anion gap: 6 (ref 5–15)
BUN: 9 mg/dL (ref 8–23)
CO2: 31 mmol/L (ref 22–32)
Calcium: 7.9 mg/dL — ABNORMAL LOW (ref 8.9–10.3)
Chloride: 96 mmol/L — ABNORMAL LOW (ref 98–111)
Creatinine, Ser: 0.35 mg/dL — ABNORMAL LOW (ref 0.44–1.00)
GFR, Estimated: >60 mL/min (ref >60)
Glucose, Bld: 91 mg/dL (ref 70–99)
Potassium: 3.9 mmol/L (ref 3.5–5.1)
Sodium: 133 mmol/L — ABNORMAL LOW (ref 135–145)
Total Bilirubin: 0.5 mg/dL (ref 0.0–1.2)
Total Protein: 5.4 g/dL — ABNORMAL LOW (ref 6.5–8.1)

## 2023-08-15 LAB — CBC
HCT: 29.3 % — ABNORMAL LOW (ref 36.0–46.0)
Hemoglobin: 9.1 g/dL — ABNORMAL LOW (ref 12.0–15.0)
MCH: 31 pg (ref 26.0–34.0)
MCHC: 31.1 g/dL (ref 30.0–36.0)
MCV: 99.7 fL (ref 80.0–100.0)
Platelets: 270 10*3/uL (ref 150–400)
RBC: 2.94 MIL/uL — ABNORMAL LOW (ref 3.87–5.11)
RDW: 12.9 % (ref 11.5–15.5)
WBC: 5.8 10*3/uL (ref 4.0–10.5)
nRBC: 0 % (ref 0.0–0.2)

## 2023-08-15 LAB — MAGNESIUM: Magnesium: 2.2 mg/dL (ref 1.7–2.4)

## 2023-08-15 LAB — CULTURE, BLOOD (ROUTINE X 2)
Culture: NO GROWTH
Culture: NO GROWTH
Special Requests: ADEQUATE

## 2023-08-15 MED ORDER — IPRATROPIUM-ALBUTEROL 0.5-2.5 (3) MG/3ML IN SOLN
3.0000 mL | Freq: Two times a day (BID) | RESPIRATORY_TRACT | Status: DC
Start: 2023-08-15 — End: 2023-08-17
  Administered 2023-08-15 – 2023-08-17 (×5): 3 mL via RESPIRATORY_TRACT
  Filled 2023-08-15 (×5): qty 3

## 2023-08-15 MED ORDER — GUAIFENESIN ER 600 MG PO TB12
1200.0000 mg | ORAL_TABLET | Freq: Two times a day (BID) | ORAL | Status: DC
  Administered 2023-08-15 – 2023-08-18 (×7): 1200 mg via ORAL
  Filled 2023-08-15 (×7): qty 2

## 2023-08-15 MED ORDER — K PHOS MONO-SOD PHOS DI & MONO 155-852-130 MG PO TABS
500.0000 mg | ORAL_TABLET | Freq: Two times a day (BID) | ORAL | Status: AC
  Administered 2023-08-15 (×2): 500 mg via ORAL
  Filled 2023-08-15 (×2): qty 2

## 2023-08-15 NOTE — Progress Notes (Signed)
Physical Therapy Treatment Patient Details Name: Savannah Jimenez MRN: [id number] DOB: 02-02-1936 Today's Date: 08/15/2023   History of Present Illness Savannah Jimenez is a 88 y.o. female with recent influenza A infection now being admitted to the hospital with sepsis due to postviral pneumonia.   PMH: GERD, osteoporosis, paroxysmal afib    PT Comments  Pt agreeable to working with therapy. She walked ~185 feet with a RW. O2 87%-89% on RA during session. Had pt practice using spirometer for 10 breaths after walk. Will continue to follow and progress activity as tolerated.     If plan is discharge home, recommend the following: A little help with walking and/or transfers;A little help with bathing/dressing/bathroom;Assistance with cooking/housework;Assist for transportation;Help with stairs or ramp for entrance   Can travel by private vehicle        Equipment Recommendations  None recommended by PT    Recommendations for Other Services       Precautions / Restrictions Precautions Precautions: Fall Precaution Comments: monitor SpO2 and HR Restrictions Weight Bearing Restrictions Per Provider Order: No     Mobility  Bed Mobility               General bed mobility comments: pt sitting EOB    Transfers Overall transfer level: Needs assistance Equipment used: Rolling walker (2 wheels) Transfers: Sit to/from Stand Sit to Stand: Supervision                Ambulation/Gait Ambulation/Gait assistance: Contact guard assist Gait Distance (Feet): 185 Feet Assistive device: Rolling walker (2 wheels) Gait Pattern/deviations: Step-through pattern, Decreased stride length       General Gait Details: Fair gait speed. SpO2 maintained at 89% on RA until last 10 feet of ambulation distance, then dropped to 87% on RA. Pt tolerated distance well.   Stairs             Wheelchair Mobility     Tilt Bed    Modified Rankin (Stroke Patients Only)       Balance Overall  balance assessment: Mild deficits observed, not formally tested                                          Cognition Arousal: Alert Behavior During Therapy: WFL for tasks assessed/performed Overall Cognitive Status: Within Functional Limits for tasks assessed                                          Exercises      General Comments        Pertinent Vitals/Pain Pain Assessment Pain Assessment: No/denies pain    Home Living                          Prior Function            PT Goals (current goals can now be found in the care plan section) Progress towards PT goals: Progressing toward goals    Frequency    Min 1X/week      PT Plan      Co-evaluation              AM-PAC PT "6 Clicks" Mobility   Outcome Measure  Help needed turning from your back to your side  while in a flat bed without using bedrails?: None Help needed moving from lying on your back to sitting on the side of a flat bed without using bedrails?: None Help needed moving to and from a bed to a chair (including a wheelchair)?: A Little Help needed standing up from a chair using your arms (e.g., wheelchair or bedside chair)?: A Little Help needed to walk in hospital room?: A Little Help needed climbing 3-5 steps with a railing? : A Little 6 Click Score: 20    End of Session Equipment Utilized During Treatment: Oxygen Activity Tolerance: Patient tolerated treatment well Patient left: in bed;with call bell/phone within reach   PT Visit Diagnosis: Unsteadiness on feet (R26.81);Muscle weakness (generalized) (M62.81)     Time: 4540-9811 PT Time Calculation (min) (ACUTE ONLY): 20 min  Charges:    $Gait Training: 8-22 mins PT General Charges $$ ACUTE PT VISIT: 1 Visit                         Faye Ramsay, PT Acute Rehabilitation  Office: [phone]

## 2023-08-15 NOTE — Progress Notes (Signed)
PROGRESS NOTE    Savannah Jimenez  KGM:[id number] DOB: August 23, 1935 DOA: 08/10/2023 PCP: Wanda Plump, MD   Brief Narrative: 88 year old with past medical history significant for PAF on Eliquis metoprolol, hypertension, hyperlipidemia presented with weakness, shortness of breath, productive cough.  Was recently diagnosed with influenza A and completed a course of Tamiflu.  She has been feeling very weak and short of breath.  She presented with leukocytosis, chest x-ray showed multifocal pneumonia.  Started on IV antibiotics.  She was also hypoxic requiring 4 L of oxygen.   Assessment & Plan:   Principal Problem:   Multifocal pneumonia   1-Sepsis due to multifocal pneumonia, acute hypoxic respiratory failure -Patient presents with fever, tachycardia, tachypnea, leukocytosis source of infection pneumonia -Requiring 2 L of oxygen, wean as tolerated -Will schedule guaifenesin, flutter valve. -Also scheduled DuoNeb twice daily -Continue IV ceftriaxone  PAF with RVR: Required diltiazem infusion and eventually changed to metoprolol. Metoprolol dose increased to 25 mg.  Continue with Eliquis  Hyponatremia: In the setting of dehydration  Hypokalemia: Replace  Hypophosphatemia: Replace  Anemia normocytic of acute illness.     Estimated body mass index is 17.98 kg/m as calculated from the following:   Height as of this encounter: 5\' 3"  (1.6 m).   Weight as of this encounter: 46 kg.   DVT prophylaxis: Eliquis Code Status: Full code Family Communication: Disposition Plan:  Status is: Inpatient Remains inpatient appropriate because: Remain for management of respiratory failure   Consultants:  None  Procedures:    Antimicrobials:    Subjective: She is still having productive cough, thick phlegm, reports shortness of breath.  Does not feel at baseline.  Objective: Vitals:   08/14/23 1430 08/14/23 1951 08/15/23 0507 08/15/23 1320  BP: (!) 145/75  (!) 147/73   Pulse: 91  92    Resp: 20  20   Temp: 97.6 F (36.4 C) 98.4 F (36.9 C) 98.5 F (36.9 C)   TempSrc: Oral Oral Oral   SpO2: 97%  96% 95%  Weight:      Height:        Intake/Output Summary (Last 24 hours) at 08/15/2023 1422 Last data filed at 08/15/2023 1233 Gross per 24 hour  Intake 750.83 ml  Output --  Net 750.83 ml   Filed Weights   08/10/23 1929  Weight: 46 kg    Examination:  General exam: Appears calm and comfortable  Respiratory system: Mild Tachypnea, BL ronchus.  Cardiovascular system: S1 & S2 heard, RRR. No JVD, murmurs, rubs, gallops or clicks. No pedal edema. Gastrointestinal system: Abdomen is nondistended, soft and nontender. No organomegaly or masses felt. Normal bowel sounds heard. Central nervous system: Alert and oriented. Extremities: Symmetric 5 x 5 power.    Data Reviewed: I have personally reviewed following labs and imaging studies  CBC: Recent Labs  Lab 08/10/23 1944 08/11/23 1032 08/12/23 0355 08/13/23 0324 08/15/23 0343  WBC 12.1* 10.2 8.1 7.2 5.8  NEUTROABS 10.8* 8.8*  --   --   --   HGB 11.4* 10.5* 9.0* 9.8* 9.1*  HCT 34.7* 31.9* 28.3* 31.5* 29.3*  MCV 93.8 94.7 98.6 99.1 99.7  PLT 311 263 243 279 270   Basic Metabolic Panel: Recent Labs  Lab 08/11/23 1032 08/12/23 0355 08/13/23 0324 08/14/23 0406 08/15/23 0343  NA 132* 133* 135 131* 133*  K 3.8 3.4* 4.8 3.8 3.9  CL 96* 97* 102 97* 96*  CO2 28 27 28 31 31   GLUCOSE 109* 102* 100* 94 91  BUN 8 12 18 13 9   CREATININE 0.46 0.45 0.68 0.50 0.35*  CALCIUM 8.3* 8.0* 8.2* 7.5* 7.9*  MG  --   --  2.1 2.0 2.2  PHOS  --   --  2.1* 2.0*  --    GFR: Estimated Creatinine Clearance: 37.3 mL/min (A) (by C-G formula based on SCr of 0.35 mg/dL (L)). Liver Function Tests: Recent Labs  Lab 08/10/23 1944 08/11/23 1032 08/13/23 0324 08/15/23 0343  AST 20 23 52* 38  ALT 18 21 38 34  ALKPHOS 63 59 66 57  BILITOT 1.4* 1.1 0.6 0.5  PROT 7.1 6.2* 6.0* 5.4*  ALBUMIN 3.2* 2.7* 2.4* 2.3*   No results  for input(s): "LIPASE", "AMYLASE" in the last 168 hours. No results for input(s): "AMMONIA" in the last 168 hours. Coagulation Profile: Recent Labs  Lab 08/10/23 1944  INR 1.2   Cardiac Enzymes: No results for input(s): "CKTOTAL", "CKMB", "CKMBINDEX", "TROPONINI" in the last 168 hours. BNP (last 3 results) No results for input(s): "PROBNP" in the last 8760 hours. HbA1C: No results for input(s): "HGBA1C" in the last 72 hours. CBG: No results for input(s): "GLUCAP" in the last 168 hours. Lipid Profile: No results for input(s): "CHOL", "HDL", "LDLCALC", "TRIG", "CHOLHDL", "LDLDIRECT" in the last 72 hours. Thyroid Function Tests: No results for input(s): "TSH", "T4TOTAL", "FREET4", "T3FREE", "THYROIDAB" in the last 72 hours. Anemia Panel: No results for input(s): "VITAMINB12", "FOLATE", "FERRITIN", "TIBC", "IRON", "RETICCTPCT" in the last 72 hours. Sepsis Labs: Recent Labs  Lab 08/10/23 1944  LATICACIDVEN 1.0    Recent Results (from the past 240 hours)  Culture, blood (Routine x 2)     Status: None   Collection Time: 08/10/23  7:43 PM   Specimen: BLOOD  Result Value Ref Range Status   Specimen Description   Final    BLOOD LEFT ANTECUBITAL Performed at [facility], [address] [place], [place] [address]    Special Requests   Final    BOTTLES DRAWN AEROBIC AND ANAEROBIC Blood Culture adequate volume Performed at [facility], [address] [place], [place] [address]    Culture   Final    NO GROWTH 5 DAYS Performed at [facility], [address], [place], [place] [address]    Report Status 08/15/2023 FINAL  Final  Culture, blood (Routine x 2)     Status: None   Collection Time: 08/10/23  7:43 PM   Specimen: BLOOD RIGHT FOREARM  Result Value Ref Range Status   Specimen Description   Final    BLOOD RIGHT FOREARM Performed at [facility], [address] [place], [place] [address]    Special Requests   Final    BOTTLES  DRAWN AEROBIC AND ANAEROBIC Blood Culture results may not be optimal due to an inadequate volume of blood received in culture bottles Performed at [facility], [address] [place], [place] [address]    Culture   Final    NO GROWTH 5 DAYS Performed at [facility], [address], [place], [place] [address]    Report Status 08/15/2023 FINAL  Final  Resp panel by RT-PCR (RSV, Flu A&B, Covid) Anterior Nasal Swab     Status: None   Collection Time: 08/10/23  7:44 PM   Specimen: Anterior Nasal Swab  Result Value Ref Range Status   SARS Coronavirus 2 by RT PCR NEGATIVE NEGATIVE Final    Comment: (NOTE) SARS-CoV-2  target nucleic acids are NOT DETECTED.  The SARS-CoV-2 RNA is generally detectable in upper respiratory specimens during the acute phase of infection. The lowest concentration of SARS-CoV-2 viral copies this assay can detect is 138 copies/mL. A negative result does not preclude SARS-Cov-2 infection and should not be used as the sole basis for treatment or other patient management decisions. A negative result may occur with  improper specimen collection/handling, submission of specimen other than nasopharyngeal swab, presence of viral mutation(s) within the areas targeted by this assay, and inadequate number of viral copies(<138 copies/mL). A negative result must be combined with clinical observations, patient history, and epidemiological information. The expected result is Negative.  Fact Sheet for Patients:  BloggerCourse.com  Fact Sheet for Healthcare Providers:  SeriousBroker.it  This test is no t yet approved or cleared by the [place] FDA and  has been authorized for detection and/or diagnosis of SARS-CoV-2 by FDA under an Emergency Use Authorization (EUA). This EUA will remain  in effect (meaning this test can be used) for the duration of the COVID-19 declaration under Section 564(b)(1) of the  Act, 21 U.S.C.section 360bbb-3(b)(1), unless the authorization is terminated  or revoked sooner.       Influenza A by PCR NEGATIVE NEGATIVE Final   Influenza B by PCR NEGATIVE NEGATIVE Final    Comment: (NOTE) The Xpert Xpress SARS-CoV-2/FLU/RSV plus assay is intended as an aid in the diagnosis of influenza from Nasopharyngeal swab specimens and should not be used as a sole basis for treatment. Nasal washings and aspirates are unacceptable for Xpert Xpress SARS-CoV-2/FLU/RSV testing.  Fact Sheet for Patients: BloggerCourse.com  Fact Sheet for Healthcare Providers: SeriousBroker.it  This test is not yet approved or cleared by the [place] FDA and has been authorized for detection and/or diagnosis of SARS-CoV-2 by FDA under an Emergency Use Authorization (EUA). This EUA will remain in effect (meaning this test can be used) for the duration of the COVID-19 declaration under Section 564(b)(1) of the Act, 21 U.S.C. section 360bbb-3(b)(1), unless the authorization is terminated or revoked.     Resp Syncytial Virus by PCR NEGATIVE NEGATIVE Final    Comment: (NOTE) Fact Sheet for Patients: BloggerCourse.com  Fact Sheet for Healthcare Providers: SeriousBroker.it  This test is not yet approved or cleared by the [place] FDA and has been authorized for detection and/or diagnosis of SARS-CoV-2 by FDA under an Emergency Use Authorization (EUA). This EUA will remain in effect (meaning this test can be used) for the duration of the COVID-19 declaration under Section 564(b)(1) of the Act, 21 U.S.C. section 360bbb-3(b)(1), unless the authorization is terminated or revoked.  Performed at [facility], [address] [place], [place] [address]   MRSA Next Gen by PCR, Nasal     Status: None   Collection Time: 08/11/23  4:56 PM   Specimen: Nasal Mucosa; Nasal Swab   Result Value Ref Range Status   MRSA by PCR Next Gen NOT DETECTED NOT DETECTED Final    Comment: (NOTE) The GeneXpert MRSA Assay (FDA approved for NASAL specimens only), is one component of a comprehensive MRSA colonization surveillance program. It is not intended to diagnose MRSA infection nor to guide or monitor treatment for MRSA infections. Test performance is not FDA approved in patients less than 40 years old. Performed at [facility], [address] [place], [place] [address]          Radiology Studies: No results found.  Scheduled Meds:  apixaban  2.5 mg Oral BID   atorvastatin  20 mg Oral QHS   busPIRone  7.5 mg Oral BID   feeding supplement  237 mL Oral BID BM   fluticasone  2 spray Each Nare Daily   guaiFENesin  1,200 mg Oral BID   ipratropium-albuterol  3 mL Nebulization BID   metoprolol succinate  25 mg Oral Daily   omega-3 acid ethyl esters  2 g Oral Daily   Continuous Infusions:  azithromycin 500 mg (08/14/23 1512)   cefTRIAXone (ROCEPHIN)  IV 2 g (08/14/23 1511)     LOS: 4 days    Time spent: 35 minutes,.     Alba Cory, MD [facility]   If 7PM-7AM, please contact night-coverage [email or web address]  08/15/2023, 2:22 PM

## 2023-08-15 NOTE — Plan of Care (Signed)

## 2023-08-15 NOTE — Progress Notes (Signed)
Physical Therapy Treatment Patient Details Name: Savannah Jimenez MRN: [id number] DOB: [birth year] Today's Date: 08/15/2023   History of Present Illness Savannah Jimenez is a 88 y.o. female with recent influenza A infection now being admitted to the hospital with sepsis due to postviral pneumonia.   PMH: GERD, osteoporosis, paroxysmal afib    PT Comments  Pt agreeable to walking with therapy again. O2 87-90% on RA with ambulation. Pt reports LE feel weak. She also reports she doesn't feel ready to d/c home just yet. Placed pt back on [place] O2 end of session-sats >90%. Will continue to follow.     If plan is discharge home, recommend the following: A little help with walking and/or transfers;A little help with bathing/dressing/bathroom;Assistance with cooking/housework;Assist for transportation;Help with stairs or ramp for entrance   Can travel by private vehicle        Equipment Recommendations  None recommended by PT    Recommendations for Other Services       Precautions / Restrictions Precautions Precautions: Fall Precaution Comments: monitor SpO2 and HR Restrictions Weight Bearing Restrictions Per Provider Order: No     Mobility  Bed Mobility               General bed mobility comments: pt sitting EOB    Transfers Overall transfer level: Needs assistance Equipment used: Rolling walker (2 wheels) Transfers: Sit to/from Stand Sit to Stand: Supervision                Ambulation/Gait Ambulation/Gait assistance: Contact guard assist Gait Distance (Feet): 125 Feet Assistive device: Rolling walker (2 wheels) Gait Pattern/deviations: Step-through pattern, Decreased stride length       General Gait Details: Distance limited by IV needing to be plugged in -low battery. O2 87-90% on RA during ambulation. Again, sats dropped last 5-10 feet of ambulation/once pt sat down. Placed 2LNC O2 back on pt.   Stairs             Wheelchair Mobility     Tilt Bed     Modified Rankin (Stroke Patients Only)       Balance Overall balance assessment: Mild deficits observed, not formally tested                                          Cognition Arousal: Alert Behavior During Therapy: WFL for tasks assessed/performed Overall Cognitive Status: Within Functional Limits for tasks assessed                                          Exercises      General Comments        Pertinent Vitals/Pain Pain Assessment Pain Assessment: No/denies pain    Home Living                          Prior Function            PT Goals (current goals can now be found in the care plan section) Progress towards PT goals: Progressing toward goals    Frequency    Min 1X/week      PT Plan      Co-evaluation              AM-PAC PT "6 Clicks" Mobility  Outcome Measure  Help needed turning from your back to your side while in a flat bed without using bedrails?: None Help needed moving from lying on your back to sitting on the side of a flat bed without using bedrails?: None Help needed moving to and from a bed to a chair (including a wheelchair)?: A Little Help needed standing up from a chair using your arms (e.g., wheelchair or bedside chair)?: A Little Help needed to walk in hospital room?: A Little Help needed climbing 3-5 steps with a railing? : A Little 6 Click Score: 20    End of Session Equipment Utilized During Treatment: Oxygen Activity Tolerance: Patient tolerated treatment well Patient left: in bed;with call bell/phone within reach   PT Visit Diagnosis: Unsteadiness on feet (R26.81);Muscle weakness (generalized) (M62.81)     Time: 1610-9604 PT Time Calculation (min) (ACUTE ONLY): 10 min  Charges:    $Gait Training: 8-22 mins PT General Charges $$ ACUTE PT VISIT: 1 Visit                         Faye Ramsay, PT Acute Rehabilitation  Office: [phone]

## 2023-08-15 NOTE — Plan of Care (Signed)
  Problem: Elimination: Goal: Will not experience complications related to urinary retention Outcome: Progressing   Problem: Elimination: Goal: Will not experience complications related to urinary retention Outcome: Progressing   Problem: Elimination: Goal: Will not experience complications related to urinary retention Outcome: Progressing   Problem: Pain Managment: Goal: General experience of comfort will improve and/or be controlled Outcome: Progressing   Problem: Safety: Goal: Ability to remain free from injury will improve Outcome: Progressing   Problem: Skin Integrity: Goal: Risk for impaired skin integrity will decrease Outcome: Progressing

## 2023-08-16 DIAGNOSIS — J189 Pneumonia, unspecified organism: Secondary | ICD-10-CM | POA: Diagnosis not present

## 2023-08-16 LAB — PHOSPHORUS: Phosphorus: 3.5 mg/dL (ref 2.5–4.6)

## 2023-08-16 NOTE — Plan of Care (Signed)

## 2023-08-16 NOTE — Progress Notes (Signed)
PROGRESS NOTE    Savannah Jimenez  ZOX:[id number] DOB: [birth year] DOA: 08/10/2023 PCP: Wanda Plump, MD   Brief Narrative: 88 year old with past medical history significant for PAF on Eliquis metoprolol, hypertension, hyperlipidemia presented with weakness, shortness of breath, productive cough.  Was recently diagnosed with influenza A and completed a course of Tamiflu.  She has been feeling very weak and short of breath.  She presented with leukocytosis, chest x-ray showed multifocal pneumonia.  Started on IV antibiotics.  She was also hypoxic requiring 4 L of oxygen.   Assessment & Plan:   Principal Problem:   Multifocal pneumonia   1-Sepsis due to multifocal pneumonia, acute hypoxic respiratory failure -Patient presents with fever, tachycardia, tachypnea, leukocytosis source of infection pneumonia -Requiring 2 L of oxygen, wean as tolerated -Schedule guaifenesin, flutter valve. -Continue with scheduled DuoNeb twice daily -Continue IV ceftriaxone day 5 Home oxygen evaluation.   PAF with RVR: Required diltiazem infusion and eventually changed to metoprolol. Metoprolol dose increased to 25 mg.  Continue with Eliquis HR has remain mostly in the 80 and 90  Hyponatremia: In the setting of dehydration  Hypokalemia: Replaced  Hypophosphatemia: Replaced  Anemia normocytic of acute illness. Monitor hb.     Estimated body mass index is 17.98 kg/m as calculated from the following:   Height as of this encounter: 5\' 3"  (1.6 m).   Weight as of this encounter: 46 kg.   DVT prophylaxis: Eliquis Code Status: Full code Family Communication: Disposition Plan:  Status is: Inpatient Remains inpatient appropriate because: Remain for management of respiratory failure   Consultants:  None  Procedures:    Antimicrobials:    Subjective: She is able to cough up phlegm better, breathing a little better.   Objective: Vitals:   08/16/23 0500 08/16/23 0802 08/16/23 1100 08/16/23  1226  BP:    (!) 147/87  Pulse:   84 96  Resp: 19  (!) 22 20  Temp:    98.1 F (36.7 C)  TempSrc:    Oral  SpO2: 97% 95% 95% 98%  Weight:      Height:        Intake/Output Summary (Last 24 hours) at 08/16/2023 1408 Last data filed at 08/16/2023 0526 Gross per 24 hour  Intake 1066.85 ml  Output --  Net 1066.85 ml   Filed Weights   08/10/23 1929  Weight: 46 kg    Examination:  General exam: NAD Respiratory system:  Normal Resp effort. BL ronchus.  Cardiovascular system: S 1, S  2 RRR Gastrointestinal system: BS present, soft nt Extremities: no edema    Data Reviewed: I have personally reviewed following labs and imaging studies  CBC: Recent Labs  Lab 08/10/23 1944 08/11/23 1032 08/12/23 0355 08/13/23 0324 08/15/23 0343  WBC 12.1* 10.2 8.1 7.2 5.8  NEUTROABS 10.8* 8.8*  --   --   --   HGB 11.4* 10.5* 9.0* 9.8* 9.1*  HCT 34.7* 31.9* 28.3* 31.5* 29.3*  MCV 93.8 94.7 98.6 99.1 99.7  PLT 311 263 243 279 270   Basic Metabolic Panel: Recent Labs  Lab 08/11/23 1032 08/12/23 0355 08/13/23 0324 08/14/23 0406 08/15/23 0343 08/16/23 0344  NA 132* 133* 135 131* 133*  --   K 3.8 3.4* 4.8 3.8 3.9  --   CL 96* 97* 102 97* 96*  --   CO2 28 27 28 31 31   --   GLUCOSE 109* 102* 100* 94 91  --   BUN 8 12 18 13 9   --  CREATININE 0.46 0.45 0.68 0.50 0.35*  --   CALCIUM 8.3* 8.0* 8.2* 7.5* 7.9*  --   MG  --   --  2.1 2.0 2.2  --   PHOS  --   --  2.1* 2.0*  --  3.5   GFR: Estimated Creatinine Clearance: 37.3 mL/min (A) (by C-G formula based on SCr of 0.35 mg/dL (L)). Liver Function Tests: Recent Labs  Lab 08/10/23 1944 08/11/23 1032 08/13/23 0324 08/15/23 0343  AST 20 23 52* 38  ALT 18 21 38 34  ALKPHOS 63 59 66 57  BILITOT 1.4* 1.1 0.6 0.5  PROT 7.1 6.2* 6.0* 5.4*  ALBUMIN 3.2* 2.7* 2.4* 2.3*   No results for input(s): "LIPASE", "AMYLASE" in the last 168 hours. No results for input(s): "AMMONIA" in the last 168 hours. Coagulation Profile: Recent Labs   Lab 08/10/23 1944  INR 1.2   Cardiac Enzymes: No results for input(s): "CKTOTAL", "CKMB", "CKMBINDEX", "TROPONINI" in the last 168 hours. BNP (last 3 results) No results for input(s): "PROBNP" in the last 8760 hours. HbA1C: No results for input(s): "HGBA1C" in the last 72 hours. CBG: No results for input(s): "GLUCAP" in the last 168 hours. Lipid Profile: No results for input(s): "CHOL", "HDL", "LDLCALC", "TRIG", "CHOLHDL", "LDLDIRECT" in the last 72 hours. Thyroid Function Tests: No results for input(s): "TSH", "T4TOTAL", "FREET4", "T3FREE", "THYROIDAB" in the last 72 hours. Anemia Panel: No results for input(s): "VITAMINB12", "FOLATE", "FERRITIN", "TIBC", "IRON", "RETICCTPCT" in the last 72 hours. Sepsis Labs: Recent Labs  Lab 08/10/23 1944  LATICACIDVEN 1.0    Recent Results (from the past 240 hours)  Culture, blood (Routine x 2)     Status: None   Collection Time: 08/10/23  7:43 PM   Specimen: BLOOD  Result Value Ref Range Status   Specimen Description   Final    BLOOD LEFT ANTECUBITAL Performed at [facility], [address] [place], [place] [address]    Special Requests   Final    BOTTLES DRAWN AEROBIC AND ANAEROBIC Blood Culture adequate volume Performed at [facility], [address], [place], [place] [address]    Culture   Final    NO GROWTH 5 DAYS Performed at [facility], [address], [place], [place] [address]    Report Status 08/15/2023 FINAL  Final  Culture, blood (Routine x 2)     Status: None   Collection Time: 08/10/23  7:43 PM   Specimen: BLOOD RIGHT FOREARM  Result Value Ref Range Status   Specimen Description   Final    BLOOD RIGHT FOREARM Performed at [facility], [address] [place], [place] [address]    Special Requests   Final    BOTTLES DRAWN AEROBIC AND ANAEROBIC Blood Culture results may not be optimal due to an inadequate volume of blood received in culture bottles Performed at [facility], [address] [place], [place] [address]    Culture   Final    NO GROWTH 5 DAYS Performed at [facility], [address], [place], [place] [address]    Report Status 08/15/2023 FINAL  Final  Resp panel by RT-PCR (RSV, Flu A&B, Covid) Anterior Nasal Swab     Status: None   Collection Time: 08/10/23  7:44 PM   Specimen: Anterior Nasal Swab  Result Value Ref Range Status   SARS Coronavirus 2 by RT PCR NEGATIVE NEGATIVE Final  Comment: (NOTE) SARS-CoV-2 target nucleic acids are NOT DETECTED.  The SARS-CoV-2 RNA is generally detectable in upper respiratory specimens during the acute phase of infection. The lowest concentration of SARS-CoV-2 viral copies this assay can detect is 138 copies/mL. A negative result does not preclude SARS-Cov-2 infection and should not be used as the sole basis for treatment or other patient management decisions. A negative result may occur with  improper specimen collection/handling, submission of specimen other than nasopharyngeal swab, presence of viral mutation(s) within the areas targeted by this assay, and inadequate number of viral copies(<138 copies/mL). A negative result must be combined with clinical observations, patient history, and epidemiological information. The expected result is Negative.  Fact Sheet for Patients:  BloggerCourse.com  Fact Sheet for Healthcare Providers:  SeriousBroker.it  This test is no t yet approved or cleared by the [place] FDA and  has been authorized for detection and/or diagnosis of SARS-CoV-2 by FDA under an Emergency Use Authorization (EUA). This EUA will remain  in effect (meaning this test can be used) for the duration of the COVID-19 declaration under Section 564(b)(1) of the Act, 21 U.S.C.section 360bbb-3(b)(1), unless the authorization is terminated  or revoked sooner.       Influenza A by PCR NEGATIVE NEGATIVE Final    Influenza B by PCR NEGATIVE NEGATIVE Final    Comment: (NOTE) The Xpert Xpress SARS-CoV-2/FLU/RSV plus assay is intended as an aid in the diagnosis of influenza from Nasopharyngeal swab specimens and should not be used as a sole basis for treatment. Nasal washings and aspirates are unacceptable for Xpert Xpress SARS-CoV-2/FLU/RSV testing.  Fact Sheet for Patients: BloggerCourse.com  Fact Sheet for Healthcare Providers: SeriousBroker.it  This test is not yet approved or cleared by the [place] FDA and has been authorized for detection and/or diagnosis of SARS-CoV-2 by FDA under an Emergency Use Authorization (EUA). This EUA will remain in effect (meaning this test can be used) for the duration of the COVID-19 declaration under Section 564(b)(1) of the Act, 21 U.S.C. section 360bbb-3(b)(1), unless the authorization is terminated or revoked.     Resp Syncytial Virus by PCR NEGATIVE NEGATIVE Final    Comment: (NOTE) Fact Sheet for Patients: BloggerCourse.com  Fact Sheet for Healthcare Providers: SeriousBroker.it  This test is not yet approved or cleared by the [place] FDA and has been authorized for detection and/or diagnosis of SARS-CoV-2 by FDA under an Emergency Use Authorization (EUA). This EUA will remain in effect (meaning this test can be used) for the duration of the COVID-19 declaration under Section 564(b)(1) of the Act, 21 U.S.C. section 360bbb-3(b)(1), unless the authorization is terminated or revoked.  Performed at [facility], [address] [place], [place] [address]   MRSA Next Gen by PCR, Nasal     Status: None   Collection Time: 08/11/23  4:56 PM   Specimen: Nasal Mucosa; Nasal Swab  Result Value Ref Range Status   MRSA by PCR Next Gen NOT DETECTED NOT DETECTED Final    Comment: (NOTE) The GeneXpert MRSA Assay (FDA approved for  NASAL specimens only), is one component of a comprehensive MRSA colonization surveillance program. It is not intended to diagnose MRSA infection nor to guide or monitor treatment for MRSA infections. Test performance is not FDA approved in patients less than 73 years old. Performed at [facility], [address], [place], [place] [address]          Radiology Studies: No results found.  Scheduled Meds:  apixaban  2.5 mg Oral BID   atorvastatin  20 mg Oral QHS   busPIRone  7.5 mg Oral BID   feeding supplement  237 mL Oral BID BM   fluticasone  2 spray Each Nare Daily   guaiFENesin  1,200 mg Oral BID   ipratropium-albuterol  3 mL Nebulization BID   metoprolol succinate  25 mg Oral Daily   omega-3 acid ethyl esters  2 g Oral Daily   Continuous Infusions:  cefTRIAXone (ROCEPHIN)  IV 2 g (08/15/23 1532)     LOS: 5 days    Time spent: 35 minutes,.     Alba Cory, MD [facility]   If 7PM-7AM, please contact night-coverage [email or web address]  08/16/2023, 2:08 PM

## 2023-08-16 NOTE — TOC Progression Note (Signed)
Transition of Care [facility]) - Progression Note   Patient Details  Name: Savannah Jimenez MRN: [id number] Date of Birth: 1936/04/22  Transition of [facility]) CM/SW Contact  Ewing Schlein, LCSW Phone Number: 08/16/2023, 11:42 AM  Clinical Narrative: Patient is expected to possibly be ready for discharge tomorrow. Hospitalist placed HHPT/OT orders. CSW updated Cindie with [facility].  Expected Discharge Plan: Home w Home Health Services Barriers to Discharge: Continued Medical Work up  Expected Discharge Plan and Services Post Acute Care Choice: Home Health Living arrangements for the past 2 months: Single Family Home HH Arranged: PT HH Agency: [facility] Date [facility] Agency Contacted: 08/13/23 Time HH Agency Contacted: 1516 Representative spoke with at [facility] Agency: Cindie  Social Determinants of Health (SDOH) Interventions SDOH Screenings   Food Insecurity: No Food Insecurity (08/11/2023)  Housing: Low Risk  (08/11/2023)  Transportation Needs: No Transportation Needs (08/11/2023)  Utilities: Not At Risk (08/11/2023)  Alcohol Screen: Low Risk  (11/15/2022)  Depression (PHQ2-9): Low Risk  (05/01/2023)  Financial Resource Strain: Low Risk  (11/15/2022)  Physical Activity: Insufficiently Active (11/15/2022)  Social Connections: Moderately Isolated (08/11/2023)  Stress: No Stress Concern Present (11/15/2022)  Tobacco Use: Low Risk  (08/10/2023)   Readmission Risk Interventions     No data to display

## 2023-08-16 NOTE — Progress Notes (Addendum)
   08/16/23 1507 08/16/23 1520 08/16/23 1521  Oxygen Therapy  SpO2 98 % (resting) (!) 87 % (lowest during walking) 93 % (sitting)  O2 Device Nasal Cannula Room Air Room Air  O2 Flow Rate (L/min) 1 L/min  --   --    Pt SpO2 between 87-93% while ambulating in hallway. Pt reports no SOB with ambulation at this time. Pt back in chair resting with SpO2 now at 97% on room air.

## 2023-08-17 DIAGNOSIS — J189 Pneumonia, unspecified organism: Secondary | ICD-10-CM | POA: Diagnosis not present

## 2023-08-17 LAB — MAGNESIUM: Magnesium: 2.3 mg/dL (ref 1.7–2.4)

## 2023-08-17 LAB — BASIC METABOLIC PANEL
Anion gap: 9 (ref 5–15)
Anion gap: 9 (ref 5–15)
BUN: 12 mg/dL (ref 8–23)
BUN: 13 mg/dL (ref 8–23)
CO2: 31 mmol/L (ref 22–32)
CO2: 31 mmol/L (ref 22–32)
Calcium: 9.2 mg/dL (ref 8.9–10.3)
Calcium: 9 mg/dL (ref 8.9–10.3)
Chloride: 94 mmol/L — ABNORMAL LOW (ref 98–111)
Chloride: 94 mmol/L — ABNORMAL LOW (ref 98–111)
Creatinine, Ser: 0.62 mg/dL (ref 0.44–1.00)
Creatinine, Ser: 0.72 mg/dL (ref 0.44–1.00)
GFR, Estimated: >60 mL/min (ref >60)
GFR, Estimated: >60 mL/min (ref >60)
Glucose, Bld: 110 mg/dL — ABNORMAL HIGH (ref 70–99)
Glucose, Bld: 130 mg/dL — ABNORMAL HIGH (ref 70–99)
Potassium: 5.9 mmol/L — ABNORMAL HIGH (ref 3.5–5.1)
Potassium: 5.2 mmol/L — ABNORMAL HIGH (ref 3.5–5.1)
Sodium: 134 mmol/L — ABNORMAL LOW (ref 135–145)
Sodium: 134 mmol/L — ABNORMAL LOW (ref 135–145)

## 2023-08-17 LAB — PHOSPHORUS: Phosphorus: 3.6 mg/dL (ref 2.5–4.6)

## 2023-08-17 MED ORDER — SODIUM ZIRCONIUM CYCLOSILICATE 5 G PO PACK
5.0000 g | PACK | Freq: Once | ORAL | Status: AC
  Administered 2023-08-17: 5 g via ORAL
  Filled 2023-08-17: qty 1

## 2023-08-17 MED ORDER — ALBUTEROL SULFATE (2.5 MG/3ML) 0.083% IN NEBU: 2.5000 mg | INHALATION_SOLUTION | RESPIRATORY_TRACT | Status: DC | PRN

## 2023-08-17 MED ORDER — METOPROLOL SUCCINATE ER 50 MG PO TB24
50.0000 mg | ORAL_TABLET | Freq: Every day | ORAL | Status: DC
  Administered 2023-08-17 – 2023-08-18 (×2): 50 mg via ORAL
  Filled 2023-08-17 (×2): qty 1

## 2023-08-17 NOTE — Progress Notes (Signed)
PHYSICAL THERAPY  SATURATION QUALIFICATIONS: (This note is used to comply with regulatory documentation for home oxygen)  Patient Saturations on Room Air at Rest = 94%  Patient Saturations on Room Air while Ambulating 175 feet = 91%  Please briefly explain why patient needs home oxygen:  Pt does NOT require supplemental oxygen during activity.  Felecia Shelling  PTA Acute  Rehabilitation Services Office M-F          [phone]

## 2023-08-17 NOTE — Progress Notes (Signed)
Occupational Therapy Treatment Patient Details Name: Savannah Jimenez MRN: [id number] DOB: May 10, 1936 Today's Date: 08/17/2023   History of present illness Savannah Jimenez is a 88 y.o. female with recent influenza A infection now being admitted to the hospital with sepsis due to postviral pneumonia.   PMH: GERD, osteoporosis, paroxysmal afib   OT comments  Pt making good progress with functional goals. O2 SATs and HR stable throughput activity/exertion. Pt eager to d/c home today      If plan is discharge home, recommend the following:  A little help with bathing/dressing/bathroom;A little help with walking and/or transfers;Assistance with cooking/housework;Assist for transportation;Help with stairs or ramp for entrance   Equipment Recommendations  None recommended by OT    Recommendations for Other Services      Precautions / Restrictions Precautions Precautions: Fall Precaution Comments: monitor SpO2 and HR Restrictions Weight Bearing Restrictions Per Provider Order: No       Mobility Bed Mobility                    Transfers                         Balance                                           ADL either performed or assessed with clinical judgement   ADL Overall ADL's : Needs assistance/impaired     Grooming: Wash/dry hands;Wash/dry face;Standing;Supervision/safety   Upper Body Bathing: Modified independent Upper Body Bathing Details (indicate cue type and reason): simulated Lower Body Bathing: Supervison/ safety;Sit to/from stand Lower Body Bathing Details (indicate cue type and reason): simulated Upper Body Dressing : Modified independent   Lower Body Dressing: Supervision/safety   Toilet Transfer: Supervision/safety;Ambulation Toilet Transfer Details (indicate cue type and reason): no AD Toileting- Clothing Manipulation and Hygiene: Modified independent;Sit to/from stand       Functional mobility during ADLs:  Supervision/safety      Extremity/Trunk Assessment Upper Extremity Assessment Upper Extremity Assessment: Overall WFL for tasks assessed   Lower Extremity Assessment Lower Extremity Assessment: Defer to PT evaluation        Vision Ability to See in Adequate Light: 0 Adequate Patient Visual Report: No change from baseline     Perception     Praxis      Cognition Arousal: Alert Behavior During Therapy: WFL for tasks assessed/performed Overall Cognitive Status: Within Functional Limits for tasks assessed                                 General Comments: AxO x 3 pleasant Lady who lives home with Spouse who has Mac Deg.  She cares for him.        Exercises      Shoulder Instructions       General Comments      Pertinent Vitals/ Pain       Pain Assessment Pain Assessment: No/denies pain Pain Score: 0-No pain Pain Intervention(s): Monitored during session, Repositioned  Home Living                                          Prior Functioning/Environment  Frequency  Min 2X/week        Progress Toward Goals  OT Goals(current goals can now be found in the care plan section)  Progress towards OT goals: Progressing toward goals     Plan      Co-evaluation                 AM-PAC OT "6 Clicks" Daily Activity     Outcome Measure   Help from another person eating meals?: None Help from another person taking care of personal grooming?: A Little Help from another person toileting, which includes using toliet, bedpan, or urinal?: None Help from another person bathing (including washing, rinsing, drying)?: A Little Help from another person to put on and taking off regular upper body clothing?: None Help from another person to put on and taking off regular lower body clothing?: A Little 6 Click Score: 21    End of Session    OT Visit Diagnosis: Muscle weakness (generalized) (M62.81);Unsteadiness on feet  (R26.81)   Activity Tolerance Patient tolerated treatment well   Patient Left in chair;with call bell/phone within reach   Nurse Communication          Time: 8119-1478 OT Time Calculation (min): 14 min  Charges: OT General Charges $OT Visit: 1 Visit OT Treatments $Self Care/Home Management : 8-22 mins    Galen Manila 08/17/2023, 1:49 PM

## 2023-08-17 NOTE — Plan of Care (Signed)

## 2023-08-17 NOTE — Progress Notes (Signed)
Physical Therapy Treatment Patient Details Name: Savannah Jimenez MRN: [id number] DOB: [birth year] Today's Date: 08/17/2023   History of Present Illness Savannah Jimenez is a 88 y.o. female with recent influenza A infection now being admitted to the hospital with sepsis due to postviral pneumonia.   PMH: GERD, osteoporosis, paroxysmal afib    PT Comments  General Comments: AxO x 3 pleasant Lady who lives home with Spouse who has Mac Deg.  She cares for him. Pt sitting EOB Indep on RA sats 94%.  General transfer comment: pt self able to safely rise with good use of hands to steady self.  Pt also self toileting using near by [facility] without staff assist. Amb in hallway went well. General Gait Details: tolerated a great distance using Rollator with avg RA sats 90% and HR 87.  RR increased ti 42.  Instructed on slow deep breathing followed by coughing.  Pt did NOT require supplemental oxygen. Pt plans to return home.  On evaluation, LPT has rec HH PT.    If plan is discharge home, recommend the following: A little help with walking and/or transfers;A little help with bathing/dressing/bathroom;Assistance with cooking/housework;Assist for transportation;Help with stairs or ramp for entrance   Can travel by private vehicle        Equipment Recommendations  None recommended by PT    Recommendations for Other Services       Precautions / Restrictions Precautions Precautions: Fall Precaution Comments: monitor SpO2 and HR Restrictions Weight Bearing Restrictions Per Provider Order: No     Mobility  Bed Mobility Overal bed mobility: Modified Independent             General bed mobility comments: pt sitting EOB on arrival    Transfers Overall transfer level: Modified independent                 General transfer comment: pt self able to safely rise with good use of hands to steady self.  Pt also self toileting using near by [facility] without staff assist.    Ambulation/Gait Ambulation/Gait  assistance: Supervision Gait Distance (Feet): 200 Feet Assistive device: Rollator (4 wheels) Gait Pattern/deviations: Step-through pattern Gait velocity: decreased     General Gait Details: tolerated a great distance using Rollator with avg RA sats 90% and HR 87.  RR increased ti 42.  Instructed on slow deep breathing followed by coughing.   Stairs             Wheelchair Mobility     Tilt Bed    Modified Rankin (Stroke Patients Only)       Balance                                            Cognition Arousal: Alert Behavior During Therapy: WFL for tasks assessed/performed Overall Cognitive Status: Within Functional Limits for tasks assessed                                 General Comments: AxO x 3 pleasant Lady who lives home with Spouse who has Mac Deg.  She cares for him.        Exercises      General Comments        Pertinent Vitals/Pain Pain Assessment Pain Assessment: Faces Faces Pain Scale: Hurts a little bit Pain Location:  chest " a little " coughing for weeks Pain Descriptors / Indicators: Aching Pain Intervention(s): Monitored during session    Home Living                          Prior Function            PT Goals (current goals can now be found in the care plan section) Progress towards PT goals: Progressing toward goals    Frequency    Min 1X/week      PT Plan      Co-evaluation              AM-PAC PT "6 Clicks" Mobility   Outcome Measure  Help needed turning from your back to your side while in a flat bed without using bedrails?: None Help needed moving from lying on your back to sitting on the side of a flat bed without using bedrails?: None Help needed moving to and from a bed to a chair (including a wheelchair)?: None Help needed standing up from a chair using your arms (e.g., wheelchair or bedside chair)?: None Help needed to walk in hospital room?: None Help needed  climbing 3-5 steps with a railing? : A Little 6 Click Score: 23    End of Session Equipment Utilized During Treatment: Gait belt Activity Tolerance: Patient tolerated treatment well Patient left: in chair;with call bell/phone within reach Nurse Communication: Mobility status PT Visit Diagnosis: Unsteadiness on feet (R26.81);Muscle weakness (generalized) (M62.81)     Time: 0454-0981 PT Time Calculation (min) (ACUTE ONLY): 19 min  Charges:    $Gait Training: 8-22 mins PT General Charges $$ ACUTE PT VISIT: 1 Visit                     Felecia Shelling  PTA Acute  Rehabilitation Services Office M-F          [phone]

## 2023-08-17 NOTE — Progress Notes (Signed)
PROGRESS NOTE    IVETT Jimenez  XBJ:[id number] DOB: 1936-06-16 DOA: 08/10/2023 PCP: Wanda Plump, MD   Brief Narrative: 88 year old with past medical history significant for PAF on Eliquis metoprolol, hypertension, hyperlipidemia presented with weakness, shortness of breath, productive cough.  Was recently diagnosed with influenza A and completed a course of Tamiflu.  She has been feeling very weak and short of breath.  She presented with leukocytosis, chest x-ray showed multifocal pneumonia.  Started on IV antibiotics.  She was also hypoxic requiring 4 L of oxygen.   Assessment & Plan:   Principal Problem:   Multifocal pneumonia   1-Sepsis due to multifocal pneumonia, acute hypoxic respiratory failure -Patient presents with fever, tachycardia, tachypnea, leukocytosis source of infection pneumonia -Schedule guaifenesin, flutter valve. -Continue with scheduled DuoNeb twice daily -Continue IV ceftriaxone day 6/7 Home oxygen evaluation.  She has now been on RA  Hyperkalemia' K at 5.2 Will give one time dose of lokelma repeat labs in am.   PAF with RVR: Required diltiazem infusion and eventually changed to metoprolol. Metoprolol dose increased to 50 mg.  Continue with Eliquis HR increase 120 at times. Increase metoprolol.   Hyponatremia: In the setting of dehydration  Hypokalemia: Replaced  Hypophosphatemia: Replaced  Anemia normocytic of acute illness. Monitor hb.     Estimated body mass index is 17.98 kg/m as calculated from the following:   Height as of this encounter: 5\' 3"  (1.6 m).   Weight as of this encounter: 46 kg.   DVT prophylaxis: Eliquis Code Status: Full code Family Communication: Disposition Plan:  Status is: Inpatient Remains inpatient appropriate because: Remain for management of respiratory failure   Consultants:  None  Procedures:    Antimicrobials:    Subjective: She is feeling better. Cough improving. Off oxygen.  HR when I was in the  room at 120--130   Objective: Vitals:   08/17/23 0549 08/17/23 0744 08/17/23 0900 08/17/23 1148  BP: (!) 151/74   128/68  Pulse:    81  Resp:    (!) 23  Temp: 98.6 F (37 C)   97.9 F (36.6 C)  TempSrc: Oral   Oral  SpO2:  97% 95% 97%  Weight:      Height:        Intake/Output Summary (Last 24 hours) at 08/17/2023 1740 Last data filed at 08/17/2023 1300 Gross per 24 hour  Intake 790 ml  Output 400 ml  Net 390 ml   Filed Weights   08/10/23 1929  Weight: 46 kg    Examination:  General exam: NAD Respiratory system:  Less ronchus.  Cardiovascular system: S 1, S 2 RRR Gastrointestinal system: BS present, soft, nt Extremities: no edema    Data Reviewed: I have personally reviewed following labs and imaging studies  CBC: Recent Labs  Lab 08/10/23 1944 08/11/23 1032 08/12/23 0355 08/13/23 0324 08/15/23 0343  WBC 12.1* 10.2 8.1 7.2 5.8  NEUTROABS 10.8* 8.8*  --   --   --   HGB 11.4* 10.5* 9.0* 9.8* 9.1*  HCT 34.7* 31.9* 28.3* 31.5* 29.3*  MCV 93.8 94.7 98.6 99.1 99.7  PLT 311 263 243 279 270   Basic Metabolic Panel: Recent Labs  Lab 08/13/23 0324 08/14/23 0406 08/15/23 0343 08/16/23 0344 08/17/23 1114 08/17/23 1229  NA 135 131* 133*  --  134* 134*  K 4.8 3.8 3.9  --  5.9* 5.2*  CL 102 97* 96*  --  94* 94*  CO2 28 31 31   --  31 31  GLUCOSE 100* 94 91  --  110* 130*  BUN 18 13 9   --  12 13  CREATININE 0.68 0.50 0.35*  --  0.62 0.72  CALCIUM 8.2* 7.5* 7.9*  --  9.2 9.0  MG 2.1 2.0 2.2  --  2.3  --   PHOS 2.1* 2.0*  --  3.5 3.6  --    GFR: Estimated Creatinine Clearance: 37.3 mL/min (by C-G formula based on SCr of 0.72 mg/dL). Liver Function Tests: Recent Labs  Lab 08/10/23 1944 08/11/23 1032 08/13/23 0324 08/15/23 0343  AST 20 23 52* 38  ALT 18 21 38 34  ALKPHOS 63 59 66 57  BILITOT 1.4* 1.1 0.6 0.5  PROT 7.1 6.2* 6.0* 5.4*  ALBUMIN 3.2* 2.7* 2.4* 2.3*   No results for input(s): "LIPASE", "AMYLASE" in the last 168 hours. No results for  input(s): "AMMONIA" in the last 168 hours. Coagulation Profile: Recent Labs  Lab 08/10/23 1944  INR 1.2   Cardiac Enzymes: No results for input(s): "CKTOTAL", "CKMB", "CKMBINDEX", "TROPONINI" in the last 168 hours. BNP (last 3 results) No results for input(s): "PROBNP" in the last 8760 hours. HbA1C: No results for input(s): "HGBA1C" in the last 72 hours. CBG: No results for input(s): "GLUCAP" in the last 168 hours. Lipid Profile: No results for input(s): "CHOL", "HDL", "LDLCALC", "TRIG", "CHOLHDL", "LDLDIRECT" in the last 72 hours. Thyroid Function Tests: No results for input(s): "TSH", "T4TOTAL", "FREET4", "T3FREE", "THYROIDAB" in the last 72 hours. Anemia Panel: No results for input(s): "VITAMINB12", "FOLATE", "FERRITIN", "TIBC", "IRON", "RETICCTPCT" in the last 72 hours. Sepsis Labs: Recent Labs  Lab 08/10/23 1944  LATICACIDVEN 1.0    Recent Results (from the past 240 hours)  Culture, blood (Routine x 2)     Status: None   Collection Time: 08/10/23  7:43 PM   Specimen: BLOOD  Result Value Ref Range Status   Specimen Description   Final    BLOOD LEFT ANTECUBITAL Performed at [facility], [address] [place], [place] [address]    Special Requests   Final    BOTTLES DRAWN AEROBIC AND ANAEROBIC Blood Culture adequate volume Performed at [facility], [address] [place], [place] [address]    Culture   Final    NO GROWTH 5 DAYS Performed at [facility], [address], [place], [place] [address]    Report Status 08/15/2023 FINAL  Final  Culture, blood (Routine x 2)     Status: None   Collection Time: 08/10/23  7:43 PM   Specimen: BLOOD RIGHT FOREARM  Result Value Ref Range Status   Specimen Description   Final    BLOOD RIGHT FOREARM Performed at [facility], [address] [place], [place] [address]    Special Requests   Final    BOTTLES DRAWN AEROBIC AND ANAEROBIC Blood Culture results may not be optimal due to  an inadequate volume of blood received in culture bottles Performed at [facility], [address] [place], [place] [address]    Culture   Final    NO GROWTH 5 DAYS Performed at [facility], [address], [place], [place] [address]    Report Status 08/15/2023 FINAL  Final  Resp panel by RT-PCR (RSV, Flu A&B, Covid) Anterior Nasal Swab     Status: None   Collection Time: 08/10/23  7:44 PM   Specimen: Anterior Nasal Swab  Result Value  Ref Range Status   SARS Coronavirus 2 by RT PCR NEGATIVE NEGATIVE Final    Comment: (NOTE) SARS-CoV-2 target nucleic acids are NOT DETECTED.  The SARS-CoV-2 RNA is generally detectable in upper respiratory specimens during the acute phase of infection. The lowest concentration of SARS-CoV-2 viral copies this assay can detect is 138 copies/mL. A negative result does not preclude SARS-Cov-2 infection and should not be used as the sole basis for treatment or other patient management decisions. A negative result may occur with  improper specimen collection/handling, submission of specimen other than nasopharyngeal swab, presence of viral mutation(s) within the areas targeted by this assay, and inadequate number of viral copies(<138 copies/mL). A negative result must be combined with clinical observations, patient history, and epidemiological information. The expected result is Negative.  Fact Sheet for Patients:  BloggerCourse.com  Fact Sheet for Healthcare Providers:  SeriousBroker.it  This test is no t yet approved or cleared by the [place] FDA and  has been authorized for detection and/or diagnosis of SARS-CoV-2 by FDA under an Emergency Use Authorization (EUA). This EUA will remain  in effect (meaning this test can be used) for the duration of the COVID-19 declaration under Section 564(b)(1) of the Act, 21 U.S.C.section 360bbb-3(b)(1), unless the authorization is  terminated  or revoked sooner.       Influenza A by PCR NEGATIVE NEGATIVE Final   Influenza B by PCR NEGATIVE NEGATIVE Final    Comment: (NOTE) The Xpert Xpress SARS-CoV-2/FLU/RSV plus assay is intended as an aid in the diagnosis of influenza from Nasopharyngeal swab specimens and should not be used as a sole basis for treatment. Nasal washings and aspirates are unacceptable for Xpert Xpress SARS-CoV-2/FLU/RSV testing.  Fact Sheet for Patients: BloggerCourse.com  Fact Sheet for Healthcare Providers: SeriousBroker.it  This test is not yet approved or cleared by the [place] FDA and has been authorized for detection and/or diagnosis of SARS-CoV-2 by FDA under an Emergency Use Authorization (EUA). This EUA will remain in effect (meaning this test can be used) for the duration of the COVID-19 declaration under Section 564(b)(1) of the Act, 21 U.S.C. section 360bbb-3(b)(1), unless the authorization is terminated or revoked.     Resp Syncytial Virus by PCR NEGATIVE NEGATIVE Final    Comment: (NOTE) Fact Sheet for Patients: BloggerCourse.com  Fact Sheet for Healthcare Providers: SeriousBroker.it  This test is not yet approved or cleared by the [place] FDA and has been authorized for detection and/or diagnosis of SARS-CoV-2 by FDA under an Emergency Use Authorization (EUA). This EUA will remain in effect (meaning this test can be used) for the duration of the COVID-19 declaration under Section 564(b)(1) of the Act, 21 U.S.C. section 360bbb-3(b)(1), unless the authorization is terminated or revoked.  Performed at [facility], [address] [place], [place] [address]   MRSA Next Gen by PCR, Nasal     Status: None   Collection Time: 08/11/23  4:56 PM   Specimen: Nasal Mucosa; Nasal Swab  Result Value Ref Range Status   MRSA by PCR Next Gen NOT DETECTED  NOT DETECTED Final    Comment: (NOTE) The GeneXpert MRSA Assay (FDA approved for NASAL specimens only), is one component of a comprehensive MRSA colonization surveillance program. It is not intended to diagnose MRSA infection nor to guide or monitor treatment for MRSA infections. Test performance is not FDA approved in patients less than 62 years old. Performed at [facility], [address] [place], [place]  [address]          Radiology Studies: No results found.      Scheduled Meds:  apixaban  2.5 mg Oral BID   atorvastatin  20 mg Oral QHS   busPIRone  7.5 mg Oral BID   feeding supplement  237 mL Oral BID BM   fluticasone  2 spray Each Nare Daily   guaiFENesin  1,200 mg Oral BID   metoprolol succinate  50 mg Oral Daily   omega-3 acid ethyl esters  2 g Oral Daily   Continuous Infusions:     LOS: 6 days    Time spent: 35 minutes,.     Alba Cory, MD [facility]   If 7PM-7AM, please contact night-coverage [email or web address]  08/17/2023, 5:40 PM

## 2023-08-17 NOTE — Progress Notes (Signed)
Oxygen testing  Room air resting 96%  Room air with exertion 91%

## 2023-08-17 NOTE — Plan of Care (Signed)

## 2023-08-18 DIAGNOSIS — J189 Pneumonia, unspecified organism: Secondary | ICD-10-CM | POA: Diagnosis not present

## 2023-08-18 LAB — BASIC METABOLIC PANEL
Anion gap: 10 (ref 5–15)
BUN: 12 mg/dL (ref 8–23)
CO2: 28 mmol/L (ref 22–32)
Calcium: 8.7 mg/dL — ABNORMAL LOW (ref 8.9–10.3)
Chloride: 95 mmol/L — ABNORMAL LOW (ref 98–111)
Creatinine, Ser: 0.54 mg/dL (ref 0.44–1.00)
GFR, Estimated: >60 mL/min (ref >60)
Glucose, Bld: 92 mg/dL (ref 70–99)
Potassium: 4.3 mmol/L (ref 3.5–5.1)
Sodium: 133 mmol/L — ABNORMAL LOW (ref 135–145)

## 2023-08-18 MED ORDER — IPRATROPIUM BROMIDE HFA 17 MCG/ACT IN AERS: 1.0000 | INHALATION_SPRAY | Freq: Three times a day (TID) | RESPIRATORY_TRACT | 2 refills | Status: AC

## 2023-08-18 MED ORDER — METOPROLOL SUCCINATE ER 50 MG PO TB24: 50.0000 mg | ORAL_TABLET | Freq: Every day | ORAL | 3 refills | Status: DC

## 2023-08-18 MED ORDER — ALBUTEROL SULFATE HFA 108 (90 BASE) MCG/ACT IN AERS: 2.0000 | INHALATION_SPRAY | Freq: Four times a day (QID) | RESPIRATORY_TRACT | 2 refills | Status: AC | PRN

## 2023-08-18 MED ORDER — MOMETASONE FURO-FORMOTEROL FUM 200-5 MCG/ACT IN AERO: 2.0000 | INHALATION_SPRAY | Freq: Two times a day (BID) | RESPIRATORY_TRACT | 0 refills | Status: DC

## 2023-08-18 MED ORDER — GUAIFENESIN ER 600 MG PO TB12: 1200.0000 mg | ORAL_TABLET | Freq: Two times a day (BID) | ORAL | 0 refills | Status: AC

## 2023-08-18 MED ORDER — CEFUROXIME AXETIL 500 MG PO TABS: 500.0000 mg | ORAL_TABLET | Freq: Two times a day (BID) | ORAL | 0 refills | Status: AC

## 2023-08-18 NOTE — Plan of Care (Signed)

## 2023-08-18 NOTE — Discharge Summary (Signed)
Physician Discharge Summary   Patient: Savannah Jimenez MRN: [id number] DOB: Jun 25, 1936  Admit date:     08/10/2023  Discharge date: 08/18/23  Discharge Physician: Alba Cory   PCP: Wanda Plump, MD   Recommendations at discharge:   Needs follow up for resolution of PNA   Discharge Diagnoses: Principal Problem:   Multifocal pneumonia Sepsis due to multifocal pneumonia, acute hypoxic respiratory failure Resolved Problems:   * No resolved hospital problems. *  Hospital Course: 88 year old with past medical history significant for PAF on Eliquis metoprolol, hypertension, hyperlipidemia presented with weakness, shortness of breath, productive cough.  Was recently diagnosed with influenza A and completed a course of Tamiflu.  She has been feeling very weak and short of breath.  She presented with leukocytosis, chest x-ray showed multifocal pneumonia.  Started on IV antibiotics.  She was also hypoxic requiring 4 L of oxygen.   Assessment and Plan: 1-Sepsis due to multifocal pneumonia, acute hypoxic respiratory failure -Patient presents with fever, tachycardia, tachypnea, leukocytosis source of infection pneumonia -Schedule guaifenesin, flutter valve. -Treated with scheduled DuoNeb twice daily -Continue IV ceftriaxone day 7/7. Discharge on Ceftin for 2 days.  Home oxygen evaluation.  She has now been on RA Discharge on Advair, ipratropium, albuterol inhaler.  Feels better, on RA.   Hyperkalemia' K at 5.2 Received  one time dose of lokelma  Resolved.     PAF with RVR: Required diltiazem infusion and eventually changed to metoprolol. Metoprolol dose increased to 50 mg.  Continue with Eliquis HR increase 120 at times. Increased metoprolol.  HR improved.   Hyponatremia: In the setting of dehydration   Hypokalemia: Replaced   Hypophosphatemia: Replaced   Anemia normocytic of acute illness. Monitor hb.        Estimated body mass index is 17.98 kg/m as calculated from the  following:   Height as of this encounter: 5\' 3"  (1.6 m).   Weight as of this encounter: 46 kg.        Consultants: none Procedures performed: none  Disposition: Home Diet recommendation:  Discharge Diet Orders (From admission, onward)     Start     Ordered   08/18/23 0000  Diet - low sodium heart healthy        08/18/23 1127           Cardiac diet DISCHARGE MEDICATION: Allergies as of 08/18/2023       Reactions   Tetanus Toxoids    Arm swelling    Penicillins    PCN reaction: big red spot with shot in high school.  No s/s anaphylaxis.  Tolerated oral cephalosporin in 2013.   Sulfacetamide Sodium    Sulfamethoxazole-trimethoprim Other (See Comments)        Medication List     STOP taking these medications    fluticasone 50 MCG/ACT nasal spray Commonly known as: FLONASE   oseltamivir 75 MG capsule Commonly known as: TAMIFLU       TAKE these medications    albuterol 108 (90 Base) MCG/ACT inhaler Commonly known as: VENTOLIN HFA Inhale 2 puffs into the lungs every 6 (six) hours as needed for wheezing or shortness of breath.   atorvastatin 20 MG tablet Commonly known as: LIPITOR Take 1 tablet (20 mg total) by mouth at bedtime.   B-12 1000 MCG Tabs Take by mouth.   busPIRone 7.5 MG tablet Commonly known as: BUSPAR Take 1 tablet (7.5 mg total) by mouth 2 (two) times daily.   cefUROXime 500 MG tablet  Commonly known as: CEFTIN Take 1 tablet (500 mg total) by mouth 2 (two) times daily with a meal for 2 days.   Eliquis 2.5 MG Tabs tablet Generic drug: apixaban TAKE 1 TABLET BY MOUTH TWICE  DAILY   fish oil-omega-3 fatty acids 1000 MG capsule Take 2 g by mouth daily.   guaiFENesin 600 MG 12 hr tablet Commonly known as: MUCINEX Take 2 tablets (1,200 mg total) by mouth 2 (two) times daily.   ipratropium 17 MCG/ACT inhaler Commonly known as: ATROVENT HFA Inhale 1 puff into the lungs 3 (three) times daily.   metoprolol succinate 50 MG 24 hr  tablet Commonly known as: TOPROL-XL Take 1 tablet (50 mg total) by mouth daily. Take with or immediately following a meal. Start taking on: August 19, 2023 What changed:  medication strength how much to take additional instructions   mometasone-formoterol 200-5 MCG/ACT Aero Commonly known as: DULERA Inhale 2 puffs into the lungs 2 (two) times daily.   ondansetron 4 MG tablet Commonly known as: ZOFRAN Take 1 tablet (4 mg total) by mouth every 6 (six) hours.   PreserVision AREDS 2 Caps Take 1 capsule by mouth 2 (two) times daily.   Vitamin D 50 MCG (2000 UT) Caps Take 1 capsule by mouth daily.   zolpidem 5 MG tablet Commonly known as: AMBIEN TAKE 1 TABLET BY MOUTH AT  BEDTIME AS NEEDED               Durable Medical Equipment  (From admission, onward)           Start     Ordered   08/16/23 1413  For home use only DME oxygen  Once       Question Answer Comment  Length of Need 6 Months   Mode or (Route) Nasal cannula   Liters per Minute 2   Frequency Continuous (stationary and portable oxygen unit needed)   Oxygen delivery system Gas      08/16/23 1412            Follow-up Information     [facility] Follow up.   Specialty: Home Health Services Why: Frances Furbish will provide PT and OT in the home after discharge. Contact information: [address] [place] [address] [phone]         Wanda Plump, MD Follow up in 1 week(s).   Specialty: Internal Medicine Contact information: [address] [place] [address] [phone]                Discharge Exam: Ceasar Mons Weights   08/10/23 1929  Weight: 46 kg   General; NAD  Condition at discharge: stable  The results of significant diagnostics from this hospitalization (including imaging, microbiology, ancillary and laboratory) are listed below for reference.   Imaging Studies: DG Chest Port 1 View Result Date: 08/10/2023 CLINICAL DATA:   Weakness, nausea, and worsening cough and congestion. EXAM: PORTABLE CHEST 1 VIEW COMPARISON:  07/29/2023. FINDINGS: The heart size and mediastinal contours are stable. There is atherosclerotic calcification of the aorta. Hyperinflation of the lungs is noted. Patchy airspace disease is noted in the mid to left lower lung field and left lung base. There is a small right pleural effusion. No pneumothorax is seen. No acute osseous abnormality is identified. IMPRESSION: 1. Patchy airspace disease in the mid to lower right lung field and left lung base, suspicious for multifocal pneumonia. 2. Small right pleural effusion. Electronically Signed  By: Thornell Sartorius M.D.   On: 08/10/2023 20:54   DG Chest 2 View Result Date: 07/29/2023 CLINICAL DATA:  Shortness of breath, productive cough. EXAM: CHEST - 2 VIEW COMPARISON:  Chest radiograph dated 07/06/2021 and CT chest dated 04/10/2022 FINDINGS: The heart is enlarged. Vascular calcifications are seen in the aortic arch. There is mild left basilar atelectasis. No pleural effusion or pneumothorax. Degenerative changes are seen in the spine. IMPRESSION: Mild left basilar atelectasis. Electronically Signed   By: Romona Curls M.D.   On: 07/29/2023 16:18    Microbiology: Results for orders placed or performed during the hospital encounter of 08/10/23  Culture, blood (Routine x 2)     Status: None   Collection Time: 08/10/23  7:43 PM   Specimen: BLOOD  Result Value Ref Range Status   Specimen Description   Final    BLOOD LEFT ANTECUBITAL Performed at [facility], [address] [place], [place] [address]    Special Requests   Final    BOTTLES DRAWN AEROBIC AND ANAEROBIC Blood Culture adequate volume Performed at [facility], [address], [place], [place] [address]    Culture   Final    NO GROWTH 5 DAYS Performed at [facility], [address], [place], [place] [address]    Report Status 08/15/2023 FINAL  Final  Culture,  blood (Routine x 2)     Status: None   Collection Time: 08/10/23  7:43 PM   Specimen: BLOOD RIGHT FOREARM  Result Value Ref Range Status   Specimen Description   Final    BLOOD RIGHT FOREARM Performed at [facility], [address] [place], [place] [address]    Special Requests   Final    BOTTLES DRAWN AEROBIC AND ANAEROBIC Blood Culture results may not be optimal due to an inadequate volume of blood received in culture bottles Performed at [facility], [address] [place], [place] [address]    Culture   Final    NO GROWTH 5 DAYS Performed at [facility], [address] [place], [place] [address]    Report Status 08/15/2023 FINAL  Final  Resp panel by RT-PCR (RSV, Flu A&B, Covid) Anterior Nasal Swab     Status: None   Collection Time: 08/10/23  7:44 PM   Specimen: Anterior Nasal Swab  Result Value Ref Range Status   SARS Coronavirus 2 by RT PCR NEGATIVE NEGATIVE Final    Comment: (NOTE) SARS-CoV-2 target nucleic acids are NOT DETECTED.  The SARS-CoV-2 RNA is generally detectable in upper respiratory specimens during the acute phase of infection. The lowest concentration of SARS-CoV-2 viral copies this assay can detect is 138 copies/mL. A negative result does not preclude SARS-Cov-2 infection and should not be used as the sole basis for treatment or other patient management decisions. A negative result may occur with  improper specimen collection/handling, submission of specimen other than nasopharyngeal swab, presence of viral mutation(s) within the areas targeted by this assay, and inadequate number of viral copies(<138 copies/mL). A negative result must be combined with clinical observations, patient history, and epidemiological information. The expected result is Negative.  Fact Sheet for Patients:  BloggerCourse.com  Fact Sheet for Healthcare Providers:  SeriousBroker.it  This test is  no t yet approved or cleared by the [place] FDA and  has been authorized for detection and/or diagnosis of SARS-CoV-2 by FDA under an Emergency Use Authorization (EUA).  This EUA will remain  in effect (meaning this test can be used) for the duration of the COVID-19 declaration under Section 564(b)(1) of the Act, 21 U.S.C.section 360bbb-3(b)(1), unless the authorization is terminated  or revoked sooner.       Influenza A by PCR NEGATIVE NEGATIVE Final   Influenza B by PCR NEGATIVE NEGATIVE Final    Comment: (NOTE) The Xpert Xpress SARS-CoV-2/FLU/RSV plus assay is intended as an aid in the diagnosis of influenza from Nasopharyngeal swab specimens and should not be used as a sole basis for treatment. Nasal washings and aspirates are unacceptable for Xpert Xpress SARS-CoV-2/FLU/RSV testing.  Fact Sheet for Patients: BloggerCourse.com  Fact Sheet for Healthcare Providers: SeriousBroker.it  This test is not yet approved or cleared by the [place] FDA and has been authorized for detection and/or diagnosis of SARS-CoV-2 by FDA under an Emergency Use Authorization (EUA). This EUA will remain in effect (meaning this test can be used) for the duration of the COVID-19 declaration under Section 564(b)(1) of the Act, 21 U.S.C. section 360bbb-3(b)(1), unless the authorization is terminated or revoked.     Resp Syncytial Virus by PCR NEGATIVE NEGATIVE Final    Comment: (NOTE) Fact Sheet for Patients: BloggerCourse.com  Fact Sheet for Healthcare Providers: SeriousBroker.it  This test is not yet approved or cleared by the [place] FDA and has been authorized for detection and/or diagnosis of SARS-CoV-2 by FDA under an Emergency Use Authorization (EUA). This EUA will remain in effect (meaning this test can be used) for the duration of the COVID-19 declaration under Section  564(b)(1) of the Act, 21 U.S.C. section 360bbb-3(b)(1), unless the authorization is terminated or revoked.  Performed at [facility], [address] [place], [place] [address]   MRSA Next Gen by PCR, Nasal     Status: None   Collection Time: 08/11/23  4:56 PM   Specimen: Nasal Mucosa; Nasal Swab  Result Value Ref Range Status   MRSA by PCR Next Gen NOT DETECTED NOT DETECTED Final    Comment: (NOTE) The GeneXpert MRSA Assay (FDA approved for NASAL specimens only), is one component of a comprehensive MRSA colonization surveillance program. It is not intended to diagnose MRSA infection nor to guide or monitor treatment for MRSA infections. Test performance is not FDA approved in patients less than 37 years old. Performed at [facility], [address] [place], [place] [address]     Labs: CBC: Recent Labs  Lab 08/12/23 0355 08/13/23 0324 08/15/23 0343  WBC 8.1 7.2 5.8  HGB 9.0* 9.8* 9.1*  HCT 28.3* 31.5* 29.3*  MCV 98.6 99.1 99.7  PLT 243 279 270   Basic Metabolic Panel: Recent Labs  Lab 08/13/23 0324 08/14/23 0406 08/15/23 0343 08/16/23 0344 08/17/23 1114 08/17/23 1229 08/18/23 0432  NA 135 131* 133*  --  134* 134* 133*  K 4.8 3.8 3.9  --  5.9* 5.2* 4.3  CL 102 97* 96*  --  94* 94* 95*  CO2 28 31 31   --  31 31 28   GLUCOSE 100* 94 91  --  110* 130* 92  BUN 18 13 9   --  12 13 12   CREATININE 0.68 0.50 0.35*  --  0.62 0.72 0.54  CALCIUM 8.2* 7.5* 7.9*  --  9.2 9.0 8.7*  MG 2.1 2.0 2.2  --  2.3  --   --   PHOS 2.1* 2.0*  --  3.5 3.6  --   --  Liver Function Tests: Recent Labs  Lab 08/13/23 0324 08/15/23 0343  AST 52* 38  ALT 38 34  ALKPHOS 66 57  BILITOT 0.6 0.5  PROT 6.0* 5.4*  ALBUMIN 2.4* 2.3*   CBG: No results for input(s): "GLUCAP" in the last 168 hours.  Discharge time spent: greater than 30 minutes.  Signed: Alba Cory, MD [facility] 08/18/2023

## 2023-08-18 NOTE — Progress Notes (Signed)
Mobility Specialist - Progress Note  (RA) Pre-mobility: 100 bpm HR, 93% SpO2 During mobility: 116 bpm HR, 90% SpO2 Post-mobility: 109 bpm HR, 92% SPO2   08/18/23 0945  Mobility  Activity Ambulated with assistance in hallway  Level of Assistance Contact guard assist, steadying assist  Assistive Device Front wheel walker  Distance Ambulated (ft) 200 ft  Range of Motion/Exercises Active  Activity Response Tolerated well  Mobility Referral Yes  Mobility visit 1 Mobility  Mobility Specialist Start Time (ACUTE ONLY) 0935  Mobility Specialist Stop Time (ACUTE ONLY) 0945  Mobility Specialist Time Calculation (min) (ACUTE ONLY) 10 min   Pt was found in bed and agreeable to ambulate. No complaints with session. At EOS returned to bed with all needs met. Call bell in reach.  Billey Chang Mobility Specialist

## 2023-08-21 ENCOUNTER — Telehealth: Payer: Self-pay

## 2023-08-22 NOTE — Transitions of Care (Post Inpatient/ED Visit) (Signed)
08/21/2023  Name: Savannah Jimenez MRN: [id number] DOB: 09/19/1935  Today's TOC FU Call Status: Today's TOC FU Call Status:: Successful TOC FU Call Completed TOC FU Call Complete Date: 08/21/23 Patient's Name and Date of Birth confirmed.  Transition Care Management Follow-up Telephone Call Date of Discharge: 08/18/23 Discharge Facility: [facility]) Type of Discharge: Inpatient Admission Primary Inpatient Discharge Diagnosis:: Pneumonia, Influenza A How have you been since you were released from the hospital?: Better Any questions or concerns?: No  Items Reviewed: Did you receive and understand the discharge instructions provided?: Yes Medications obtained,verified, and reconciled?: Yes (Medications Reviewed) Any new allergies since your discharge?: No Dietary orders reviewed?: Yes Type of Diet Ordered:: Low salt, Low Fat Do you have support at home?: Yes People in Home: child(ren), adult, spouse  Medications Reviewed Today: Medications Reviewed Today     Reviewed by Wyline Mood, RN (Case Manager) on 08/21/23 at 1458  Med List Status: <None>   Medication Order Taking? Sig Documenting Provider Last Dose Status Informant  albuterol (VENTOLIN HFA) 108 (90 Base) MCG/ACT inhaler [id number] Yes Inhale 2 puffs into the lungs every 6 (six) hours as needed for wheezing or shortness of breath. Regalado, Belkys A, MD Taking Active   atorvastatin (LIPITOR) 20 MG tablet [id number] Yes Take 1 tablet (20 mg total) by mouth at bedtime. Wanda Plump, MD Taking Active Self, Pharmacy Records  busPIRone (BUSPAR) 7.5 MG tablet [id number] Yes Take 1 tablet (7.5 mg total) by mouth 2 (two) times daily. Wanda Plump, MD Taking Active Self, Pharmacy Records  Cholecalciferol (VITAMIN D) 50 MCG (2000 UT) CAPS [id number] Yes Take 1 capsule by mouth daily. [provider] Taking Active Self, Pharmacy Records  Cyanocobalamin (B-12) 1000 MCG TABS [id number] Yes Take by mouth. [provider]  Taking Active Self, Pharmacy Records  ELIQUIS 2.5 MG TABS tablet [id number] Yes TAKE 1 TABLET BY MOUTH TWICE  DAILY Runell Gess, MD Taking Active Self, Pharmacy Records  fish oil-omega-3 fatty acids 1000 MG capsule [id number] Yes Take 2 g by mouth daily. [provider] Taking Active Self, Pharmacy Records  guaiFENesin (MUCINEX) 600 MG 12 hr tablet [id number] Yes Take 2 tablets (1,200 mg total) by mouth 2 (two) times daily. Regalado, Belkys A, MD Taking Active   ipratropium (ATROVENT HFA) 17 MCG/ACT inhaler [id number] Yes Inhale 1 puff into the lungs 3 (three) times daily. Regalado, Belkys A, MD Taking Active   metoprolol succinate (TOPROL-XL) 50 MG 24 hr tablet [id number] Yes Take 1 tablet (50 mg total) by mouth daily. Take with or immediately following a meal. Regalado, Belkys A, MD Taking Active   mometasone-formoterol (DULERA) 200-5 MCG/ACT AERO [id number] Yes Inhale 2 puffs into the lungs 2 (two) times daily. Alba Cory, MD Taking Active   Multiple Vitamins-Minerals (PRESERVISION AREDS 2) CAPS [id number] Yes Take 1 capsule by mouth 2 (two) times daily.  [provider] Taking Active Self, Pharmacy Records  ondansetron (ZOFRAN) 4 MG tablet [id number] No Take 1 tablet (4 mg total) by mouth every 6 (six) hours.  Patient not taking: Reported on 08/21/2023   Glyn Ade, MD Not Taking Active Self, Pharmacy Records  zolpidem [facility]) 5 MG tablet [id number] Yes TAKE 1 TABLET BY MOUTH AT  BEDTIME AS NEEDED Wanda Plump, MD Taking Active Self, Pharmacy Records            Home Care and Equipment/Supplies: Were Home Health Services Ordered?: Yes Name of Home Health Agency:: [facility] Has Agency set up a  time to come to your home?: No EMR reviewed for Home Health Orders: Orders present/patient has not received call (refer to CM for follow-up) Any new equipment or medical supplies ordered?: No  Functional Questionnaire: Do you need assistance with bathing/showering or  dressing?: No Do you need assistance with meal preparation?: Yes Do you need assistance with eating?: No Do you have difficulty maintaining continence: No Do you need assistance with getting out of bed/getting out of a chair/moving?: No Do you have difficulty managing or taking your medications?: No  Follow up appointments reviewed: PCP Follow-up appointment confirmed?: Yes Date of PCP follow-up appointment?: 08/28/23 Follow-up Provider: Dr. Willow Ora Specialist [facility] Follow-up appointment confirmed?: NA Do you need transportation to your follow-up appointment?: No Do you understand care options if your condition(s) worsen?: Yes-patient verbalized understanding  SDOH Interventions Today    Flowsheet Row Most Recent Value  SDOH Interventions   Food Insecurity Interventions Intervention Not Indicated  Housing Interventions Intervention Not Indicated  Transportation Interventions Intervention Not Indicated  Utilities Interventions Intervention Not Indicated  Social Connections Interventions Intervention Not Indicated      Interventions Today    Flowsheet Row Most Recent Value  Chronic Disease   Chronic disease during today's visit Other  [Pneumonia, Influenza A]  Exercise Interventions   Exercise Discussed/Reviewed Assistive device use and maintanence, Physical Activity  Physical Activity Discussed/Reviewed Physical Activity Discussed, Physical Activity Reviewed  Education Interventions   Education Provided Provided Education  Provided Verbal Education On Nutrition, When to see the doctor, Medication, Insurance Plans  Nutrition Interventions   Nutrition Discussed/Reviewed Nutrition Discussed, Nutrition Reviewed, Decreasing salt, Decreasing fats  Pharmacy Interventions   Pharmacy Dicussed/Reviewed Medications and their functions, Medication Adherence  Safety Interventions   Safety Discussed/Reviewed Safety Discussed, Safety Reviewed, Fall Risk       TOC Interventions  Today    Flowsheet Row Most Recent Value  TOC Interventions   TOC Interventions Discussed/Reviewed TOC Interventions Discussed, TOC Interventions Reviewed, Post discharge activity limitations per provider, S/S of infection      Patient states she feels much better; still a little weak and tires more easily but still an improvement from were she was prior to her hospitalization.  Today, patient denies any shortness of breath, difficulty breathing or chest pain.  Patient finish oral antibiotics as she only had 2 more days to take after her discharge.  Patient states she has not heard from [facility].  However, she indicates that she does not feel that she needs home health physical therapy or occupational therapy.  Patient reports she is independent with transfers, walking, bathing and dressing.  RNCM read the last couple of notes from physical and occupational therapy while patient was hospitalized and they seem to corroborate what the patient stated.  Patient said she and her husband are moving into [employer] on Friday.  Patient states they have already received their keys.  Patient sounds excited about the upcoming move as she states they have friends and church members that reside there as well.  Patient declined for [facility] to make outreach to [facility] as she says she doesn't need therapy - states she is independent and is not requiring an assistive device.  RNCM will share TOC note with patient's PCP so they are aware that she has declined home health services at this time.  TOC program reviewed/discussed.  Patient declines enrollment into 30 day program at this time. Patient provided [facility] RNCM contact information should she  have questions or concerns after our call today.  No acute or SDOH barriers identified.  Cache Decoursey Daphine Deutscher BSN, Programmer, systems / Transitions of Care [place] [facility], [facility] Direct Dial  Number:  [phone]

## 2023-08-28 ENCOUNTER — Ambulatory Visit (INDEPENDENT_AMBULATORY_CARE_PROVIDER_SITE_OTHER): Payer: Medicare Other | Admitting: Internal Medicine

## 2023-08-28 ENCOUNTER — Encounter: Payer: Self-pay | Admitting: Internal Medicine

## 2023-08-28 VITALS — BP 126/68 | HR 77 | Temp 97.6°F | Resp 18 | Ht 63.0 in | Wt 104.1 lb

## 2023-08-28 DIAGNOSIS — I4892 Unspecified atrial flutter: Secondary | ICD-10-CM

## 2023-08-28 DIAGNOSIS — J189 Pneumonia, unspecified organism: Secondary | ICD-10-CM

## 2023-08-28 DIAGNOSIS — J101 Influenza due to other identified influenza virus with other respiratory manifestations: Secondary | ICD-10-CM | POA: Diagnosis not present

## 2023-08-28 NOTE — Assessment & Plan Note (Signed)
 Hospital follow-up.  TCM 14 Multifocal pneumonia in the context of influenza A, acute respiratory failure hypoxic. Was discharged on antibiotics x 2 days, Dulera, ipratropium, albuterol . Was tolerating room air. Had transient hyperkalemia, took Lokelma  x 1. Had an episode of atrial fibrillation with RVR requiring diltiazem  fusion.  Metoprolol  dose increased, rx to continue with Eliquis . She was also found to have anemia.  Hemoglobin 9.1.  No recent GI symptoms Plan: BMP, CBC, Dulera twice daily, ipratropium as needed, hold albuterol  (may increase heart rate), call if respiratory symptoms do not continue improving. Needs a follow-up chest x-ray, patient prefers to wait until she sees Dr. Shelah in March. Atrial fibrillation with recent RVR: Metoprolol  dose increased, BP and heart rate satisfactory today, no change.  Continue anticoagulation. RTC scheduled for March as well

## 2023-08-28 NOTE — Progress Notes (Signed)
 Subjective:    Patient ID: Savannah Jimenez, female    DOB: 06-09-1936, 88 y.o.   MRN: [id number]  DOS:  08/28/2023 Type of visit - description: Hospital follow-up, here with daughter Savannah Jimenez  Admitted to the hospital and discharged 08/18/2023. Prior to admission diagnosed with influenza A, went to the ER very weak, was eventually diagnosed with sepsis due to multifocal pneumonia, hypoxic respiratory failure. Treated with antibiotics, DuoNeb twice daily, was on oxygen temporarily  Last labs: Sodium 133, potassium 4.3, creatinine 0.5. Hemoglobin 9.1, previously ~ 13.0.  Since she left the hospital.  She is feeling slightly better, still energy is not back 100%. No fever or chills. Still coughing some with whitish sputum DOE: Back to baseline Appetite improving Denies chest pain or palpitations Denies any recent GI problems including no blood in the stools, no abdominal pain.  Review of Systems See above   Past Medical History:  Diagnosis Date   Allergy    Arthritis    BCC (basal cell carcinoma of skin)    GERD (gastroesophageal reflux disease)    Glaucoma suspect    HOH (hard of hearing)    Osteoporosis     Past Surgical History:  Procedure Laterality Date   ABDOMINAL HYSTERECTOMY     still have cervix   ARTERY BIOPSY Right 10/06/2014   Procedure: BIOPSY TEMPORAL ARTERY;  Surgeon: Krystal Spinner, MD;  Location: [facility];  Service: General;  Laterality: Right;   BREAST EXCISIONAL BIOPSY Right    over 20 years ago; lymph nodes removed   CATARACT EXTRACTION  10/2011   Left   colonscopy  2007   INNER EAR SURGERY  1974   both stapes replaced-steal   TUBAL LIGATION      Current Outpatient Medications  Medication Instructions   albuterol  (VENTOLIN  HFA) 108 (90 Base) MCG/ACT inhaler 2 puffs, Inhalation, Every 6 hours PRN   atorvastatin  (LIPITOR) 20 mg, Oral, Daily at bedtime   busPIRone  (BUSPAR ) 7.5 mg, Oral, 2 times daily   Cholecalciferol (VITAMIN D ) 50 MCG  (2000 UT) CAPS 1 capsule, Daily   Cyanocobalamin  (B-12) 1000 MCG TABS Take by mouth.   ELIQUIS  2.5 MG TABS tablet TAKE 1 TABLET BY MOUTH TWICE  DAILY   fish oil-omega-3 fatty acids  2 g, Daily   guaiFENesin  (MUCINEX ) 1,200 mg, Oral, 2 times daily   ipratropium (ATROVENT  HFA) 17 MCG/ACT inhaler 1 puff, Inhalation, 3 times daily   metoprolol  succinate (TOPROL -XL) 50 mg, Oral, Daily, Take with or immediately following a meal.   mometasone -formoterol  (DULERA) 200-5 MCG/ACT AERO 2 puffs, Inhalation, 2 times daily   Multiple Vitamins-Minerals (PRESERVISION AREDS 2) CAPS 1 capsule, 2 times daily   ondansetron  (ZOFRAN ) 4 mg, Oral, Every 6 hours   zolpidem  (AMBIEN ) 5 mg, Oral, At bedtime PRN       Objective:   Physical Exam BP 126/68   Pulse 77   Temp 97.6 F (36.4 C) (Oral)   Resp 18   Ht 5' 3 (1.6 m)   Wt 104 lb 2 oz (47.2 kg)   SpO2 95%   BMI 18.44 kg/m  General:   Well developed, NAD, underweight appearing but nontoxic. HEENT:  Normocephalic . Face symmetric, atraumatic Lungs:  Few rhonchi bilaterally. Normal respiratory effort, no intercostal retractions, no accessory muscle use. Heart: RRR,  no murmur.  Lower extremities: no pretibial edema bilaterally  Skin: Not pale. Not jaundice Neurologic:  alert & oriented X3.  Speech normal, gait appropriate for age and unassisted Psych--  Cognition and judgment appear intact.  Cooperative with normal attention span and concentration.  Behavior appropriate. No anxious or depressed appearing.      Assessment     Assessment, transferring from Dr. Esta (01/2018)  Allergies GERD Anxiety depression Atrial fibrillation/flutter Dx 2022 TIA: Dx 2022. Osteoporosis: - rx  actonel  before  -Tscore  -2.2 (10-2017), -2.3 (June 2023), declines treatment as of 03/28/2023.  Aware of risks. Palpable abdominal aorta: Abdominal ultrasound  09/2018 negative for AAA B12 deficiency Pulmonary nodule - sees  pulmonary H/o polymyalgia  rheumatica; used to see Dr Everlean, [place]. Bx 2016 Ear implants: No MRI Pectus excavatum-- chronic DOE   [facility] follow-up.  TCM 14 Multifocal pneumonia in the context of influenza A, acute respiratory failure hypoxic. Was discharged on antibiotics x 2 days, Dulera, ipratropium, albuterol . Was tolerating room air. Had transient hyperkalemia, took Lokelma  x 1. Had an episode of atrial fibrillation with RVR requiring diltiazem  fusion.  Metoprolol  dose increased, rx to continue with Eliquis . She was also found to have anemia.  Hemoglobin 9.1.  No recent GI symptoms Plan: BMP, CBC, Dulera twice daily, ipratropium as needed, hold albuterol  (may increase heart rate), call if respiratory symptoms do not continue improving. Needs a follow-up chest x-ray, patient prefers to wait until she sees Dr. Shelah in March. Atrial fibrillation with recent RVR: Metoprolol  dose increased, BP and heart rate satisfactory today, no change.  Continue anticoagulation. RTC scheduled for March as well

## 2023-08-28 NOTE — Patient Instructions (Signed)
 Inhalers: Dulera twice daily every day If you have cough, wheezing, chest congestion: Ipratropium 3 times a day. Hold albuterol  for now If your respiration does not continue improving, let me know  Check the  blood pressure regularly Blood pressure goal:  between 110/65 and  135/85. If it is consistently higher or lower, let me know     GO TO THE LAB : Get the blood work    See you in March, sooner if needed.

## 2023-08-29 LAB — CBC WITH DIFFERENTIAL/PLATELET
Basophils Absolute: 0 10*3/uL (ref 0.0–0.1)
Basophils Relative: 0.7 % (ref 0.0–3.0)
Eosinophils Absolute: 0 10*3/uL (ref 0.0–0.7)
Eosinophils Relative: 0.4 % (ref 0.0–5.0)
HCT: 31.6 % — ABNORMAL LOW (ref 36.0–46.0)
Hemoglobin: 10.5 g/dL — ABNORMAL LOW (ref 12.0–15.0)
Lymphocytes Relative: 18.4 % (ref 12.0–46.0)
Lymphs Abs: 1 10*3/uL (ref 0.7–4.0)
MCHC: 33.3 g/dL (ref 30.0–36.0)
MCV: 96.2 fL (ref 78.0–100.0)
Monocytes Absolute: 0.5 10*3/uL (ref 0.1–1.0)
Monocytes Relative: 9.7 % (ref 3.0–12.0)
Neutro Abs: 4 10*3/uL (ref 1.4–7.7)
Neutrophils Relative %: 70.8 % (ref 43.0–77.0)
Platelets: 340 10*3/uL (ref 150.0–400.0)
RBC: 3.28 Mil/uL — ABNORMAL LOW (ref 3.87–5.11)
RDW: 15.1 % (ref 11.5–15.5)
WBC: 5.7 10*3/uL (ref 4.0–10.5)

## 2023-08-29 LAB — BASIC METABOLIC PANEL
BUN: 27 mg/dL — ABNORMAL HIGH (ref 6–23)
CO2: 26 meq/L (ref 19–32)
Calcium: 8.7 mg/dL (ref 8.4–10.5)
Chloride: 100 meq/L (ref 96–112)
Creatinine, Ser: 1.04 mg/dL (ref 0.40–1.20)
GFR: 49.13 mL/min — ABNORMAL LOW (ref >60.00)
Glucose, Bld: 96 mg/dL (ref 70–99)
Potassium: 4.5 meq/L (ref 3.5–5.1)
Sodium: 134 meq/L — ABNORMAL LOW (ref 135–145)

## 2023-08-30 ENCOUNTER — Encounter: Payer: Self-pay | Admitting: Internal Medicine

## 2023-09-26 ENCOUNTER — Ambulatory Visit: Payer: Medicare Other | Admitting: Internal Medicine

## 2023-10-08 ENCOUNTER — Encounter: Payer: Self-pay | Admitting: Internal Medicine

## 2023-10-08 ENCOUNTER — Ambulatory Visit (INDEPENDENT_AMBULATORY_CARE_PROVIDER_SITE_OTHER): Admitting: Internal Medicine

## 2023-10-08 VITALS — BP 116/72 | HR 68 | Temp 98.1°F | Resp 16 | Ht 63.0 in | Wt 102.0 lb

## 2023-10-08 DIAGNOSIS — J189 Pneumonia, unspecified organism: Secondary | ICD-10-CM

## 2023-10-08 DIAGNOSIS — D649 Anemia, unspecified: Secondary | ICD-10-CM

## 2023-10-08 DIAGNOSIS — I4892 Unspecified atrial flutter: Secondary | ICD-10-CM

## 2023-10-08 DIAGNOSIS — R5383 Other fatigue: Secondary | ICD-10-CM

## 2023-10-08 LAB — CBC WITH DIFFERENTIAL/PLATELET
Basophils Absolute: 0 10*3/uL (ref 0.0–0.1)
Basophils Relative: 0.5 % (ref 0.0–3.0)
Eosinophils Absolute: 0 10*3/uL (ref 0.0–0.7)
Eosinophils Relative: 0.4 % (ref 0.0–5.0)
HCT: 37.1 % (ref 36.0–46.0)
Hemoglobin: 12.1 g/dL (ref 12.0–15.0)
Lymphocytes Relative: 28.4 % (ref 12.0–46.0)
Lymphs Abs: 1.5 10*3/uL (ref 0.7–4.0)
MCHC: 32.6 g/dL (ref 30.0–36.0)
MCV: 96.6 fl (ref 78.0–100.0)
Monocytes Absolute: 0.4 10*3/uL (ref 0.1–1.0)
Monocytes Relative: 7.7 % (ref 3.0–12.0)
Neutro Abs: 3.3 10*3/uL (ref 1.4–7.7)
Neutrophils Relative %: 63 % (ref 43.0–77.0)
Platelets: 237 10*3/uL (ref 150.0–400.0)
RBC: 3.84 Mil/uL — ABNORMAL LOW (ref 3.87–5.11)
RDW: 14.9 % (ref 11.5–15.5)
WBC: 5.3 10*3/uL (ref 4.0–10.5)

## 2023-10-08 LAB — IBC + FERRITIN
Ferritin: 48.9 ng/mL (ref 10.0–291.0)
Iron: 71 ug/dL (ref 42–145)
Saturation Ratios: 21.7 % (ref 20.0–50.0)
TIBC: 327.6 ug/dL (ref 250.0–450.0)
Transferrin: 234 mg/dL (ref 212.0–360.0)

## 2023-10-08 MED ORDER — METOPROLOL SUCCINATE ER 50 MG PO TB24: 25.0000 mg | ORAL_TABLET | Freq: Every day | ORAL | Status: DC

## 2023-10-08 NOTE — Patient Instructions (Addendum)
 Vaccines I recommend: Covid booster  Decrease metoprolol 50 mg: 1/2  tablet daily  Check the  blood pressure regularly Blood pressure goal:  between 110/65 and  135/85. If it is consistently higher or lower, let me know  Also check your heart rate, goal: Less than 80   GO TO THE LAB : Get the blood work     Please go to the front desk: Arrange for a follow-up in 3 months

## 2023-10-08 NOTE — Progress Notes (Unsigned)
 Subjective:    Patient ID: Savannah Jimenez, female    DOB: 11/07/1935, 88 y.o.   MRN: [id number]  DOS:  10/08/2023 Type of visit - description: Here with her daughter  Follow-up from previous visit, she was seen shortly after she was admitted to the hospital with pneumonia.  Since then, her only concern is that she is still fatigued. Denies fever or chills. Still have some cough. Still has DOE with ADLs but that has been a chronic issue. BP check few days ago: 99/50.  About 3 weeks ago she had norovirus per patient.  Had nausea vomiting and severe diarrhea.  Symptoms have resolved.  Denies chest pain or claudication. Review of Systems See above   Past Medical History:  Diagnosis Date   Allergy    Arthritis    BCC (basal cell carcinoma of skin)    GERD (gastroesophageal reflux disease)    Glaucoma suspect    HOH (hard of hearing)    Osteoporosis     Past Surgical History:  Procedure Laterality Date   ABDOMINAL HYSTERECTOMY     still have cervix   ARTERY BIOPSY Right 10/06/2014   Procedure: BIOPSY TEMPORAL ARTERY;  Surgeon: Darnell Level, MD;  Location: [facility];  Service: General;  Laterality: Right;   BREAST EXCISIONAL BIOPSY Right    over 20 years ago; lymph nodes removed   CATARACT EXTRACTION  10/2011   Left   colonscopy  2007   INNER EAR SURGERY  1974   both stapes replaced-steal   TUBAL LIGATION      Current Outpatient Medications  Medication Instructions   albuterol (VENTOLIN HFA) 108 (90 Base) MCG/ACT inhaler 2 puffs, Inhalation, Every 6 hours PRN   atorvastatin (LIPITOR) 20 mg, Oral, Daily at bedtime   busPIRone (BUSPAR) 7.5 mg, Oral, 2 times daily   Cholecalciferol (VITAMIN D) 50 MCG (2000 UT) CAPS 1 capsule, Daily   Cyanocobalamin (B-12) 1000 MCG TABS Take by mouth.   ELIQUIS 2.5 MG TABS tablet TAKE 1 TABLET BY MOUTH TWICE  DAILY   fish oil-omega-3 fatty acids 2 g, Daily   guaiFENesin (MUCINEX) 1,200 mg, Oral, 2 times daily   ipratropium  (ATROVENT HFA) 17 MCG/ACT inhaler 1 puff, Inhalation, 3 times daily   metoprolol succinate (TOPROL-XL) 50 mg, Oral, Daily, Take with or immediately following a meal.   mometasone-formoterol (DULERA) 200-5 MCG/ACT AERO 2 puffs, Inhalation, 2 times daily   Multiple Vitamins-Minerals (PRESERVISION AREDS 2) CAPS 1 capsule, 2 times daily   ondansetron (ZOFRAN) 4 mg, Oral, Every 6 hours   zolpidem (AMBIEN) 5 mg, Oral, At bedtime PRN       Objective:   Physical Exam BP 116/72   Pulse 68   Temp 98.1 F (36.7 C) (Oral)   Resp 16   Ht 5\' 3"  (1.6 m)   Wt 102 lb (46.3 kg)   SpO2 97%   BMI 18.07 kg/m  General:   Well developed, NAD, BMI noted. HEENT:  Normocephalic . Face symmetric, atraumatic Lungs:  CTA B Normal respiratory effort, no intercostal retractions, no accessory muscle use. Heart: RRR,  no murmur.  Lower extremities: no pretibial edema bilaterally  Skin: Not pale. Not jaundice Neurologic:  alert & oriented X3.  Speech normal, gait appropriate for age and unassisted Psych--  Cognition and judgment appear intact.  Cooperative with normal attention span and concentration.  Behavior appropriate. No anxious or depressed appearing.      Assessment     Assessment, transferring from  Dr. Clifton Custard (01/2018)  Allergies GERD Anxiety depression Atrial fibrillation/flutter Dx 2022 TIA: Dx 2022. Osteoporosis: - rx  actonel before  -Tscore  -2.2 (10-2017), -2.3 (June 2023), declines treatment as of 03/28/2023.  Aware of risks. Palpable abdominal aorta: Abdominal ultrasound  09/2018 negative for AAA B12 deficiency Pulmonary nodule - sees  pulmonary H/o polymyalgia rheumatica; used to see Dr Kellie Simmering, [place]. Bx 2016 Ear implants: No MRI Pectus excavatum-- chronic DOE   PLAN Multifocal pneumonia: Clinically better, still has some cough, recommend good hydration, robitussin, Dulera and as needed inhalers. Declined need to have a chest x-ray since she is going to see the pulmonary  doctor soon. Atrial fibrillation: During recent admission, metoprolol was increased, she thinks that these contributing to fatigue, we agreed to decrease metoprolol XL 50 mg to half tablet a day monitor BPs and heart rate. Anemia: Had anemia during the hospital admission, last hemoglobin improving.  She is anticoagulated but denies current GI symptoms.  Will recheck CBC and anemia panel PVD?  Had a housecall screening in December 2024, questionable PVD, denies claudication.  Observation. RTC 3 to 4 months  Hospital follow-up.  TCM 14 Multifocal pneumonia in the context of influenza A, acute respiratory failure hypoxic. Was discharged on antibiotics x 2 days, Dulera, ipratropium, albuterol. Was tolerating room air. Had transient hyperkalemia, took Lokelma x 1. Had an episode of atrial fibrillation with RVR requiring diltiazem fusion.  Metoprolol dose increased, rx to continue with Eliquis. She was also found to have anemia.  Hemoglobin 9.1.  No recent GI symptoms Plan: BMP, CBC, Dulera twice daily, ipratropium as needed, hold albuterol (may increase heart rate), call if respiratory symptoms do not continue improving. Needs a follow-up chest x-ray, patient prefers to wait until she sees Dr. Delton Coombes in March. Atrial fibrillation with recent RVR: Metoprolol dose increased, BP and heart rate satisfactory today, no change.  Continue anticoagulation. RTC scheduled for March as well

## 2023-10-09 NOTE — Assessment & Plan Note (Signed)
 Multifocal pneumonia: Clinically better, still has some cough, recommend good hydration, robitussin, Dulera and as needed albuterol. Declined  a chest x-ray since she is going to see the pulmonary doctor soon. Atrial fibrillation: During recent admission, metoprolol was increased, she thinks that these contributing to fatigue, we agreed to decrease metoprolol XL 50 mg to half tablet a day monitor BPs and heart rate. Anemia: Had anemia during the hospital admission, last hemoglobin improving.  She is anticoagulated but denies current GI symptoms.  Will recheck CBC and anemia panel PVD?  Had a housecall screening in December 2024, questionable PVD, denies claudication.  Observation. Fatigue: chronic, see OV 06-24-21 RTC 3 to 4 months

## 2023-10-10 ENCOUNTER — Encounter: Payer: Self-pay | Admitting: Internal Medicine

## 2023-10-18 ENCOUNTER — Encounter: Payer: Self-pay | Admitting: Emergency Medicine

## 2023-10-18 ENCOUNTER — Ambulatory Visit (INDEPENDENT_AMBULATORY_CARE_PROVIDER_SITE_OTHER): Payer: Medicare Other | Admitting: Emergency Medicine

## 2023-10-18 VITALS — BP 128/60 | HR 60 | Ht 63.0 in | Wt 101.2 lb

## 2023-10-18 DIAGNOSIS — R911 Solitary pulmonary nodule: Secondary | ICD-10-CM

## 2023-10-18 DIAGNOSIS — J189 Pneumonia, unspecified organism: Secondary | ICD-10-CM | POA: Diagnosis not present

## 2023-10-18 NOTE — Assessment & Plan Note (Signed)
 Recent hospitalization and pneumonia in the setting of influenza in January.  She had a chest x-ray then with some patchy opacities.  We will characterize these and look for resolution on her CT chest

## 2023-10-18 NOTE — Progress Notes (Signed)
 Subjective:    Patient ID: Savannah Jimenez, female    DOB: 06/07/1936, 88 y.o.   MRN: [id number]  HPI 88 year old never smoker with a history of GERD, rhinitis, basal cell carcinoma of the skin.  She has seen Dr. Tonia Brooms in our office in the past for evaluation of pulmonary nodule on chest imaging.  She last saw him 04/2022.  The CT showed a right middle lobe nodule and some inflammatory changes consistent with possible Mycobacterium avium She was hospitalized in January w flu and PNA.  She feels better, breathing ok. Has to stop to rest w vacuuming.   CT chest 04/10/2022 reviewed by me showed 1.1 cm subcarinal node, 1.3 x 0.7 cm ovoid right lower lobe opacity, previously the medial continuation of a larger slightly more lateral nodular opacity on prior studies (the lateral aspect is resolved)  Chest x-ray 08/10/2023 reviewed by me showed some patchy airspace disease in the mid to lower right lung field and left base.  A small right pleural effusion.   Review of Systems As per HPI  Past Medical History:  Diagnosis Date   Allergy    Arthritis    BCC (basal cell carcinoma of skin)    GERD (gastroesophageal reflux disease)    Glaucoma suspect    HOH (hard of hearing)    Osteoporosis      Family History  Problem Relation Age of Onset   Heart failure Mother    Hypertension Sister    Asthma Sister    Hypertension Brother      Social History   Socioeconomic History   Marital status: Married    Spouse name: Not on file   Number of children: 2   Years of education: Not on file   Highest education level: 12th grade  Occupational History   Occupation: retired , day care  Tobacco Use   Smoking status: Never   Smokeless tobacco: Never  Substance and Sexual Activity   Alcohol use: Yes    Comment: rarely   Drug use: No   Sexual activity: Not on file  Other Topics Concern   Not on file  Social History Narrative   Household: pt and husband       2 g- children   2 g-g- children    Social Drivers of Corporate investment banker Strain: Low Risk  (11/15/2022)   Overall Financial Resource Strain (CARDIA)    Difficulty of Paying Living Expenses: Not hard at all  Food Insecurity: No Food Insecurity (08/21/2023)   Hunger Vital Sign    Worried About Running Out of Food in the Last Year: Never true    Ran Out of Food in the Last Year: Never true  Transportation Needs: No Transportation Needs (08/21/2023)   PRAPARE - Administrator, Civil Service (Medical): No    Lack of Transportation (Non-Medical): No  Physical Activity: Insufficiently Active (11/15/2022)   Exercise Vital Sign    Days of Exercise per Week: 1 day    Minutes of Exercise per Session: 10 min  Stress: No Stress Concern Present (11/15/2022)   [employer] of Occupational Health - Occupational Stress Questionnaire    Feeling of Stress : Only a little  Social Connections: Moderately Integrated (08/21/2023)   Social Connection and Isolation Panel [NHANES]    Frequency of Communication with Friends and Family: Three times a week    Frequency of Social Gatherings with Friends and Family: Once a week    Attends Religious  Services: 1 to 4 times per year    Active Member of Clubs or Organizations: No    Attends Banker Meetings: Never    Marital Status: Married  Recent Concern: Social Connections - Moderately Isolated (08/11/2023)   Social Connection and Isolation Panel [NHANES]    Frequency of Communication with Friends and Family: Twice a week    Frequency of Social Gatherings with Friends and Family: Never    Attends Religious Services: More than 4 times per year    Active Member of [employer] or Organizations: No    Attends Banker Meetings: Never    Marital Status: Married  Catering manager Violence: Not At Risk (08/21/2023)   Humiliation, Afraid, Rape, and Kick questionnaire    Fear of Current or Ex-Partner: No    Emotionally Abused: No    Physically Abused: No     Sexually Abused: No     Allergies  Allergen Reactions   Tetanus Toxoids     Arm swelling    Penicillins     PCN reaction: big red spot with shot in high school.  No s/s anaphylaxis.  Tolerated oral cephalosporin in 2013.   Sulfacetamide Sodium    Sulfamethoxazole-Trimethoprim Other (See Comments)     Outpatient Medications Prior to Visit  Medication Sig Dispense Refill   atorvastatin (LIPITOR) 20 MG tablet Take 1 tablet (20 mg total) by mouth at bedtime. 90 tablet 1   Cholecalciferol (VITAMIN D) 50 MCG (2000 UT) CAPS Take 1 capsule by mouth daily.     Cyanocobalamin (B-12) 1000 MCG TABS Take by mouth.     ELIQUIS 2.5 MG TABS tablet TAKE 1 TABLET BY MOUTH TWICE  DAILY 180 tablet 3   fish oil-omega-3 fatty acids 1000 MG capsule Take 2 g by mouth daily.     guaiFENesin (MUCINEX) 600 MG 12 hr tablet Take 2 tablets (1,200 mg total) by mouth 2 (two) times daily. 120 tablet 0   ipratropium (ATROVENT HFA) 17 MCG/ACT inhaler Inhale 1 puff into the lungs 3 (three) times daily. 1 each 2   metoprolol succinate (TOPROL-XL) 50 MG 24 hr tablet Take 0.5 tablets (25 mg total) by mouth daily. Take with or immediately following a meal.     mometasone-formoterol (DULERA) 200-5 MCG/ACT AERO Inhale 2 puffs into the lungs 2 (two) times daily. 1 each 0   Multiple Vitamins-Minerals (PRESERVISION AREDS 2) CAPS Take 1 capsule by mouth 2 (two) times daily.      ondansetron (ZOFRAN) 4 MG tablet Take 1 tablet (4 mg total) by mouth every 6 (six) hours. 12 tablet 0   zolpidem (AMBIEN) 5 MG tablet TAKE 1 TABLET BY MOUTH AT  BEDTIME AS NEEDED 90 tablet 0   albuterol (VENTOLIN HFA) 108 (90 Base) MCG/ACT inhaler Inhale 2 puffs into the lungs every 6 (six) hours as needed for wheezing or shortness of breath. (Patient not taking: Reported on 10/18/2023) 8 g 2   busPIRone (BUSPAR) 7.5 MG tablet Take 1 tablet (7.5 mg total) by mouth 2 (two) times daily. (Patient not taking: Reported on 10/18/2023) 180 tablet 1   No  facility-administered medications prior to visit.        Objective:   Physical Exam Vitals:   10/18/23 1120  BP: 128/60  Pulse: 60  SpO2: 99%  Weight: 101 lb 3.2 oz (45.9 kg)  Height: 5\' 3"  (1.6 m)   Gen: Pleasant, thin woman, in no distress,  normal affect  ENT: No lesions,  mouth clear,  oropharynx clear, no postnasal drip  Neck: No JVD, no stridor  Lungs: No use of accessory muscles, pectus deformity present, no wheeze or crackles.   Cardiovascular: RRR, heart sounds normal, no murmur or gallops, no peripheral edema  Musculoskeletal: No deformities, no cyanosis or clubbing  Neuro: alert, awake, non focal  Skin: Warm, no lesions or rash      Assessment & Plan:  Lung nodule Stable right basilar pulmonary nodule.  She needs another CT chest to establish a full 2 years of stability.  Discussed this with her today.  We will perform and then get back together to review.  She does not have any symptoms that would be consistent with Mycobacterium avium, no other findings on CT that are suggestive.  Multifocal pneumonia Recent hospitalization and pneumonia in the setting of influenza in January.  She had a chest x-ray then with some patchy opacities.  We will characterize these and look for resolution on her CT chest  Time spent 43 minutes  Levy Pupa, MD, PhD 10/18/2023, 12:01 PM [facility] [phone] or if no answer before 7:00PM call [phone] For any issues after 7:00PM please call eLink [phone]

## 2023-10-18 NOTE — Assessment & Plan Note (Signed)
 Stable right basilar pulmonary nodule.  She needs another CT chest to establish a full 2 years of stability.  Discussed this with her today.  We will perform and then get back together to review.  She does not have any symptoms that would be consistent with Mycobacterium avium, no other findings on CT that are suggestive.

## 2023-10-18 NOTE — Patient Instructions (Addendum)
 We will repeat your CT scan of the chest without contrast Follow with Dr. Delton Coombes after your CT so we can review those results together. Please notify us if you have any changes in your breathing or any new respiratory symptoms.

## 2023-11-16 ENCOUNTER — Ambulatory Visit (HOSPITAL_BASED_OUTPATIENT_CLINIC_OR_DEPARTMENT_OTHER)
Admission: RE | Admit: 2023-11-16 | Discharge: 2023-11-16 | Disposition: A | Discharge status: Home / Self Care | Source: Ambulatory Visit | Attending: Emergency Medicine | Admitting: Emergency Medicine

## 2023-11-16 DIAGNOSIS — R911 Solitary pulmonary nodule: Secondary | ICD-10-CM | POA: Insufficient documentation

## 2023-11-22 ENCOUNTER — Telehealth: Payer: Self-pay

## 2023-11-22 NOTE — Telephone Encounter (Signed)
 Copied from CRM [id number]. Topic: Clinical - Lab/Test Results >> Nov 20, 2023  4:05 PM Hilton Lucky wrote: Reason for CRM: Patient calling for lung scan results from 'two weeks ago'. Informed patient results are not back. Please call patient when results are in.  Sent Mychart message. NFN

## 2023-12-03 ENCOUNTER — Encounter: Payer: Self-pay | Admitting: Cardiovascular Disease

## 2023-12-03 ENCOUNTER — Ambulatory Visit: Payer: Medicare Other | Attending: Cardiovascular Disease | Admitting: Cardiovascular Disease

## 2023-12-03 VITALS — BP 92/60 | HR 65 | Ht 63.0 in | Wt 102.0 lb

## 2023-12-03 DIAGNOSIS — I2510 Atherosclerotic heart disease of native coronary artery without angina pectoris: Secondary | ICD-10-CM | POA: Diagnosis not present

## 2023-12-03 DIAGNOSIS — I4892 Unspecified atrial flutter: Secondary | ICD-10-CM

## 2023-12-03 DIAGNOSIS — E782 Mixed hyperlipidemia: Secondary | ICD-10-CM | POA: Diagnosis not present

## 2023-12-03 DIAGNOSIS — I951 Orthostatic hypotension: Secondary | ICD-10-CM

## 2023-12-03 MED ORDER — METOPROLOL SUCCINATE ER 50 MG PO TB24: 25.0000 mg | ORAL_TABLET | Freq: Every day | ORAL | 3 refills | Status: DC

## 2023-12-03 NOTE — Progress Notes (Signed)
 12/03/2023 Savannah Jimenez   30-Apr-1936  [id number]  Primary Physician Ezell Hollow, MD Primary Cardiologist: Avanell Leigh MD Bennye Bravo, [place]  HPI:  Savannah Jimenez is a 88 y.o.  thin-appearing married Caucasian female mother of 2, grandmother of 2 grandchildren who requested provider transferred to me because she was a friend of Savannah Jimenez, a friend and former patient of mine.  She has seen Dr. Alvis Jimenez back in December and Savannah Jimenez 08/25/2021.  I last saw her in the office 11/28/2022.  She is accompanied by one of her daughters Savannah Jimenez today who is best friends with Savannah Jimenez.  The original referral was because of orthostatic hypotension which is improved since discontinuing Paxil .  She is retired from working in Audiological scientist.  Her only cardiovascular risk factor are treated hyperlipidemia.  She does have a PMR which has been quiescent.  There is no family history.  She has had a TIA in the past.  Since stopping Paxil  her orthostatic hypotension symptoms have improved.  She did have a 2-week Zio patch which showed a less than 1% frequency of paroxysmal atrial flutter currently on Eliquis .  She had a coronary CTA that showed minimal plaquing in the LAD, ramus and RCA.  Her hyperlipidemia is under good control on statin therapy.   Since I saw her a year ago she is remained stable.  She no longer has symptoms of orthostatic hypotension.  She is fairly active, goes to the gym 3 days a week, and denies symptoms.  She and her husband moved to [facility], independent living recently because her husband has progressive dementia.  She is independent and drives.   Current Meds  Medication Sig   albuterol  (VENTOLIN  HFA) 108 (90 Base) MCG/ACT inhaler Inhale 2 puffs into the lungs every 6 (six) hours as needed for wheezing or shortness of breath.   atorvastatin  (LIPITOR) 20 MG tablet Take 1 tablet (20 mg total) by mouth at bedtime.   busPIRone  (BUSPAR ) 7.5 MG tablet Take 1 tablet (7.5 mg total)  by mouth 2 (two) times daily.   Cholecalciferol (VITAMIN D ) 50 MCG (2000 UT) CAPS Take 1 capsule by mouth daily.   Cyanocobalamin  (B-12) 1000 MCG TABS Take by mouth.   ELIQUIS  2.5 MG TABS tablet TAKE 1 TABLET BY MOUTH TWICE  DAILY   fish oil-omega-3 fatty acids  1000 MG capsule Take 2 g by mouth daily.   guaiFENesin  (MUCINEX ) 600 MG 12 hr tablet Take 2 tablets (1,200 mg total) by mouth 2 (two) times daily.   ipratropium (ATROVENT  HFA) 17 MCG/ACT inhaler Inhale 1 puff into the lungs 3 (three) times daily.   metoprolol  succinate (TOPROL -XL) 50 MG 24 hr tablet Take 0.5 tablets (25 mg total) by mouth daily. Take with or immediately following a meal.   mometasone -formoterol  (DULERA) 200-5 MCG/ACT AERO Inhale 2 puffs into the lungs 2 (two) times daily.   Multiple Vitamins-Minerals (PRESERVISION AREDS 2) CAPS Take 1 capsule by mouth 2 (two) times daily.    ondansetron  (ZOFRAN ) 4 MG tablet Take 1 tablet (4 mg total) by mouth every 6 (six) hours.   zolpidem  (AMBIEN ) 5 MG tablet TAKE 1 TABLET BY MOUTH AT  BEDTIME AS NEEDED     Allergies  Allergen Reactions   Tetanus Toxoids     Arm swelling    Penicillins     PCN reaction: big red spot with shot in high school.  No s/s anaphylaxis.  Tolerated oral cephalosporin in 2013.  Sulfacetamide Sodium    Sulfamethoxazole-Trimethoprim Other (See Comments)    Social History   Socioeconomic History   Marital status: Married    Spouse name: Not on file   Number of children: 2   Years of education: Not on file   Highest education level: 12th grade  Occupational History   Occupation: retired , day care  Tobacco Use   Smoking status: Never   Smokeless tobacco: Never  Substance and Sexual Activity   Alcohol use: Yes    Comment: rarely   Drug use: No   Sexual activity: Not on file  Other Topics Concern   Not on file  Social History Narrative   Household: pt and husband       2 g- children   2 g-g- children   Social Drivers of Manufacturing engineer Strain: Low Risk  (11/15/2022)   Overall Financial Resource Strain (CARDIA)    Difficulty of Paying Living Expenses: Not hard at all  Food Insecurity: No Food Insecurity (08/21/2023)   Hunger Vital Sign    Worried About Running Out of Food in the Last Year: Never true    Ran Out of Food in the Last Year: Never true  Transportation Needs: No Transportation Needs (08/21/2023)   PRAPARE - Administrator, Civil Service (Medical): No    Lack of Transportation (Non-Medical): No  Physical Activity: Insufficiently Active (11/15/2022)   Exercise Vital Sign    Days of Exercise per Week: 1 day    Minutes of Exercise per Session: 10 min  Stress: No Stress Concern Present (11/15/2022)   [employer] of Occupational Health - Occupational Stress Questionnaire    Feeling of Stress : Only a little  Social Connections: Moderately Integrated (08/21/2023)   Social Connection and Isolation Panel [NHANES]    Frequency of Communication with Friends and Family: Three times a week    Frequency of Social Gatherings with Friends and Family: Once a week    Attends Religious Services: 1 to 4 times per year    Active Member of [employer] or Organizations: No    Attends Banker Meetings: Never    Marital Status: Married  Recent Concern: Social Connections - Moderately Isolated (08/11/2023)   Social Connection and Isolation Panel [NHANES]    Frequency of Communication with Friends and Family: Twice a week    Frequency of Social Gatherings with Friends and Family: Never    Attends Religious Services: More than 4 times per year    Active Member of [employer] or Organizations: No    Attends Banker Meetings: Never    Marital Status: Married  Catering manager Violence: Not At Risk (08/21/2023)   Humiliation, Afraid, Rape, and Kick questionnaire    Fear of Current or Ex-Partner: No    Emotionally Abused: No    Physically Abused: No    Sexually Abused: No     Review  of Systems: General: negative for chills, fever, night sweats or weight changes.  Cardiovascular: negative for chest pain, dyspnea on exertion, edema, orthopnea, palpitations, paroxysmal nocturnal dyspnea or shortness of breath Dermatological: negative for rash Respiratory: negative for cough or wheezing Urologic: negative for hematuria Abdominal: negative for nausea, vomiting, diarrhea, bright red blood per rectum, melena, or hematemesis Neurologic: negative for visual changes, syncope, or dizziness All other systems reviewed and are otherwise negative except as noted above.    Blood pressure 92/60, pulse 65, height 5\' 3"  (1.6 m), weight 102 lb (46.3  kg), SpO2 95%.  General appearance: alert and no distress Neck: no adenopathy, no carotid bruit, no JVD, supple, symmetrical, trachea midline, and thyroid  not enlarged, symmetric, no tenderness/mass/nodules Lungs: clear to auscultation bilaterally Heart: regular rate and rhythm, S1, S2 normal, no murmur, click, rub or gallop Extremities: extremities normal, atraumatic, no cyanosis or edema Pulses: 2+ and symmetric Skin: Skin color, texture, turgor normal. No rashes or lesions Neurologic: Grossly normal  EKG not performed today      ASSESSMENT AND PLAN:   No problem-specific Assessment & Plan notes found for this encounter.      Avanell Leigh MD FACP,FACC,FAHA, [facility] 12/03/2023 11:23 AM

## 2023-12-03 NOTE — Patient Instructions (Signed)

## 2023-12-03 NOTE — Addendum Note (Signed)
 Addended by: Deforest Fast on: 12/03/2023 11:53 AM   Modules accepted: Orders

## 2023-12-03 NOTE — Assessment & Plan Note (Signed)
 History of CAD with coronary CTA performed 08/15/2021 revealing a coronary calcium  score of 196 with mild nonobstructive CAD.  She is asymptomatic.

## 2023-12-03 NOTE — Assessment & Plan Note (Signed)
 History of hyperlipidemia on statin therapy with lipid profile performed 03/28/23 revealing total cholesterol 145, LDL 68 and HDL 56.

## 2023-12-03 NOTE — Assessment & Plan Note (Signed)
 History of PAF maintaining sinus rhythm on Eliquis oral anticoagulation.

## 2023-12-03 NOTE — Assessment & Plan Note (Signed)
 History of orthostatic hypotension which no longer seems to be a clinical issue.

## 2023-12-12 ENCOUNTER — Telehealth: Payer: Self-pay | Admitting: Internal Medicine

## 2023-12-12 ENCOUNTER — Encounter (INDEPENDENT_AMBULATORY_CARE_PROVIDER_SITE_OTHER): Payer: Medicare Other | Admitting: Ophthalmology

## 2023-12-12 DIAGNOSIS — H43813 Vitreous degeneration, bilateral: Secondary | ICD-10-CM | POA: Diagnosis not present

## 2023-12-12 DIAGNOSIS — H35342 Macular cyst, hole, or pseudohole, left eye: Secondary | ICD-10-CM | POA: Diagnosis not present

## 2023-12-12 DIAGNOSIS — H353131 Nonexudative age-related macular degeneration, bilateral, early dry stage: Secondary | ICD-10-CM

## 2023-12-12 DIAGNOSIS — H43822 Vitreomacular adhesion, left eye: Secondary | ICD-10-CM

## 2023-12-12 NOTE — Telephone Encounter (Signed)
 PDMP okay, Rx sent

## 2023-12-12 NOTE — Telephone Encounter (Signed)
 Requesting: Ambien  5mg   Contract: 12/05/22 UDS: Ambien  only Last Visit: 10/08/23 Next Visit: 01/15/24 Last Refill: 08/09/23 #90 and 0RF   Please Advise

## 2023-12-13 ENCOUNTER — Encounter: Payer: Self-pay | Admitting: Emergency Medicine

## 2023-12-13 ENCOUNTER — Ambulatory Visit (INDEPENDENT_AMBULATORY_CARE_PROVIDER_SITE_OTHER): Admitting: Emergency Medicine

## 2023-12-13 VITALS — BP 126/64 | HR 82 | Ht 63.0 in | Wt 102.8 lb

## 2023-12-13 DIAGNOSIS — J301 Allergic rhinitis due to pollen: Secondary | ICD-10-CM | POA: Diagnosis not present

## 2023-12-13 DIAGNOSIS — R918 Other nonspecific abnormal finding of lung field: Secondary | ICD-10-CM | POA: Diagnosis not present

## 2023-12-13 DIAGNOSIS — J209 Acute bronchitis, unspecified: Secondary | ICD-10-CM | POA: Insufficient documentation

## 2023-12-13 DIAGNOSIS — R911 Solitary pulmonary nodule: Secondary | ICD-10-CM | POA: Diagnosis not present

## 2023-12-13 MED ORDER — AZITHROMYCIN 250 MG PO TABS: ORAL_TABLET | ORAL | 0 refills | Status: DC

## 2023-12-13 NOTE — Assessment & Plan Note (Signed)
 Scattered micronodular disease in a pattern that has been consistent with possible MAIC.  Her most recent CT shows some areas of clearance compared with 04/10/2022 including an ovoid right lower lobe opacity.  She has a new somewhat rounded opacity in the lingula of unclear significance.  We will plan to follow this in 3 months.  If she shows progressive nodular disease then we could consider bronchoscopy for cultures, cytology

## 2023-12-13 NOTE — Assessment & Plan Note (Signed)
 Plan to treat with a course of azithromycin  and follow for resolution.  If this turns out to be more subacute or chronic then we will need to consider controlling the PND, possible culture data to diagnose MAIC, etc.

## 2023-12-13 NOTE — Assessment & Plan Note (Signed)
 Not currently on a dedicated regimen.  May need to initiate this as PND does appear to be an exacerbating factor for her cough.  Currently she feels that it is adequately controlled on Mucinex  DM

## 2023-12-13 NOTE — Patient Instructions (Signed)
 Please take azithromycin  as directed until completely gone Okay to continue to use your guaifenesin /dextromethorphan (DM) as needed for cough and drainage suppression We reviewed your CT scan of the chest today. We will plan to repeat your CT scan in late July 2025. Please follow in our office in late July to review your CT.  Call sooner if you have any problems.

## 2023-12-13 NOTE — Progress Notes (Signed)
 Subjective:    Patient ID: Savannah Jimenez, female    DOB: 1935-10-09, 88 y.o.   MRN: [id number]  HPI 88 year old never smoker with a history of GERD, rhinitis, basal cell carcinoma of the skin.  She has seen Dr. Thelda Jimenez in our office in the past for evaluation of pulmonary nodule on chest imaging.  She last saw him 04/2022.  The CT showed a right middle lobe nodule and some inflammatory changes consistent with possible Mycobacterium avium She was hospitalized in January w flu and PNA.  She feels better, breathing ok. Has to stop to rest w vacuuming.   CT chest 04/10/2022 reviewed by me showed 1.1 cm subcarinal node, 1.3 x 0.7 cm ovoid right lower lobe opacity, previously the medial continuation of a larger slightly more lateral nodular opacity on prior studies (the lateral aspect is resolved)  Chest x-ray 08/10/2023 reviewed by me showed some patchy airspace disease in the mid to lower right lung field and left base.  A small right pleural effusion.  ROV 12/13/2023 --follow-up visit for Savannah Jimenez.  She is 88, never smoker with patch she inflammatory change and nodular disease on CT scan of the chest, question Mycobacterium avium (not proven).  She had pneumonia in January 2025.  We repeated her CT chest to evaluate for stability and her inflammatory disease, a right basilar pulmonary nodule.  She reports today that she has had an increase cough for the last week, some yellow sputum. Can happen any time, during the day. Has tried mucinex  with some relief. Some increased PND.   CT scan of the chest/25/25 reviewed by me shows some lingular inflammatory change, micronodular 1.5 x 1.6 cm as well as some scattered areas of scarring and micro nodularity.  Other areas of right middle lobe micronodularity up to 4 mm are stable, also stable changes in the right upper lobe up to 3 mm.   Review of Systems As per HPI  Past Medical History:  Diagnosis Date   Allergy    Arthritis    BCC (basal cell carcinoma of  skin)    GERD (gastroesophageal reflux disease)    Glaucoma suspect    HOH (hard of hearing)    Osteoporosis      Family History  Problem Relation Age of Onset   Heart failure Mother    Hypertension Sister    Asthma Sister    Hypertension Brother      Social History   Socioeconomic History   Marital status: Married    Spouse name: Not on file   Number of children: 2   Years of education: Not on file   Highest education level: 12th grade  Occupational History   Occupation: retired , day care  Tobacco Use   Smoking status: Never   Smokeless tobacco: Never  Substance and Sexual Activity   Alcohol use: Yes    Comment: rarely   Drug use: No   Sexual activity: Not on file  Other Topics Concern   Not on file  Social History Narrative   Household: pt and husband       2 g- children   2 g-g- children   Social Drivers of Corporate investment banker Strain: Low Risk  (11/15/2022)   Overall Financial Resource Strain (CARDIA)    Difficulty of Paying Living Expenses: Not hard at all  Food Insecurity: No Food Insecurity (08/21/2023)   Hunger Vital Sign    Worried About Running Out of Food in the Last Year:  Never true    Ran Out of Food in the Last Year: Never true  Transportation Needs: No Transportation Needs (08/21/2023)   PRAPARE - Administrator, Civil Service (Medical): No    Lack of Transportation (Non-Medical): No  Physical Activity: Insufficiently Active (11/15/2022)   Exercise Vital Sign    Days of Exercise per Week: 1 day    Minutes of Exercise per Session: 10 min  Stress: No Stress Concern Present (11/15/2022)   [employer] of Occupational Health - Occupational Stress Questionnaire    Feeling of Stress : Only a little  Social Connections: Moderately Integrated (08/21/2023)   Social Connection and Isolation Panel [NHANES]    Frequency of Communication with Friends and Family: Three times a week    Frequency of Social Gatherings with Friends and  Family: Once a week    Attends Religious Services: 1 to 4 times per year    Active Member of [employer] or Organizations: No    Attends Banker Meetings: Never    Marital Status: Married  Recent Concern: Social Connections - Moderately Isolated (08/11/2023)   Social Connection and Isolation Panel [NHANES]    Frequency of Communication with Friends and Family: Twice a week    Frequency of Social Gatherings with Friends and Family: Never    Attends Religious Services: More than 4 times per year    Active Member of [employer] or Organizations: No    Attends Banker Meetings: Never    Marital Status: Married  Catering manager Violence: Not At Risk (08/21/2023)   Humiliation, Afraid, Rape, and Kick questionnaire    Fear of Current or Ex-Partner: No    Emotionally Abused: No    Physically Abused: No    Sexually Abused: No     Allergies  Allergen Reactions   Tetanus Toxoids     Arm swelling    Penicillins     PCN reaction: big red spot with shot in high school.  No s/s anaphylaxis.  Tolerated oral cephalosporin in 2013.   Sulfacetamide Sodium    Sulfamethoxazole-Trimethoprim Other (See Comments)     Outpatient Medications Prior to Visit  Medication Sig Dispense Refill   albuterol  (VENTOLIN  HFA) 108 (90 Base) MCG/ACT inhaler Inhale 2 puffs into the lungs every 6 (six) hours as needed for wheezing or shortness of breath. 8 g 2   atorvastatin  (LIPITOR) 20 MG tablet Take 1 tablet (20 mg total) by mouth at bedtime. 90 tablet 1   busPIRone  (BUSPAR ) 7.5 MG tablet Take 1 tablet (7.5 mg total) by mouth 2 (two) times daily. 180 tablet 1   Cholecalciferol (VITAMIN D ) 50 MCG (2000 UT) CAPS Take 1 capsule by mouth daily.     Cyanocobalamin  (B-12) 1000 MCG TABS Take by mouth.     ELIQUIS  2.5 MG TABS tablet TAKE 1 TABLET BY MOUTH TWICE  DAILY 180 tablet 3   fish oil-omega-3 fatty acids  1000 MG capsule Take 2 g by mouth daily.     guaiFENesin  (MUCINEX ) 600 MG 12 hr tablet Take 2  tablets (1,200 mg total) by mouth 2 (two) times daily. 120 tablet 0   ipratropium (ATROVENT  HFA) 17 MCG/ACT inhaler Inhale 1 puff into the lungs 3 (three) times daily. 1 each 2   metoprolol  succinate (TOPROL -XL) 50 MG 24 hr tablet Take 0.5 tablets (25 mg total) by mouth daily. Take with or immediately following a meal. 45 tablet 3   Multiple Vitamins-Minerals (PRESERVISION AREDS 2) CAPS Take 1 capsule by  mouth 2 (two) times daily.      ondansetron  (ZOFRAN ) 4 MG tablet Take 1 tablet (4 mg total) by mouth every 6 (six) hours. 12 tablet 0   zolpidem  (AMBIEN ) 5 MG tablet TAKE 1 TABLET BY MOUTH AT  BEDTIME AS NEEDED 90 tablet 0   mometasone -formoterol  (DULERA) 200-5 MCG/ACT AERO Inhale 2 puffs into the lungs 2 (two) times daily. (Patient not taking: Reported on 12/13/2023) 1 each 0   No facility-administered medications prior to visit.        Objective:   Physical Exam Vitals:   12/13/23 1303  BP: 126/64  Pulse: 82  SpO2: 95%  Weight: 102 lb 12.8 oz (46.6 kg)  Height: 5\' 3"  (1.6 m)   Gen: Pleasant, thin woman, in no distress,  normal affect  ENT: No lesions,  mouth clear,  oropharynx clear, no postnasal drip  Neck: No JVD, no stridor  Lungs: No use of accessory muscles, pectus deformity present, no wheeze or crackles.   Cardiovascular: RRR, heart sounds normal, no murmur or gallops, no peripheral edema  Musculoskeletal: No deformities, no cyanosis or clubbing  Neuro: alert, awake, non focal  Skin: Warm, no lesions or rash      Assessment & Plan:  Pulmonary nodules Scattered micronodular disease in a pattern that has been consistent with possible MAIC.  Her most recent CT shows some areas of clearance compared with 04/10/2022 including an ovoid right lower lobe opacity.  She has a new somewhat rounded opacity in the lingula of unclear significance.  We will plan to follow this in 3 months.  If she shows progressive nodular disease then we could consider bronchoscopy for cultures,  cytology  Allergic rhinitis Not currently on a dedicated regimen.  May need to initiate this as PND does appear to be an exacerbating factor for her cough.  Currently she feels that it is adequately controlled on Mucinex  DM  Acute bronchitis Plan to treat with a course of azithromycin  and follow for resolution.  If this turns out to be more subacute or chronic then we will need to consider controlling the PND, possible culture data to diagnose [facility], etc.   Time spent 44 minutes  Racheal Buddle, MD, PhD 12/13/2023, 1:49 PM [facility] [phone] or if no answer before 7:00PM call [phone] For any issues after 7:00PM please call eLink [phone]

## 2023-12-20 ENCOUNTER — Ambulatory Visit: Payer: Medicare Other | Admitting: *Deleted

## 2023-12-20 ENCOUNTER — Other Ambulatory Visit (HOSPITAL_BASED_OUTPATIENT_CLINIC_OR_DEPARTMENT_OTHER): Payer: Self-pay | Admitting: Internal Medicine

## 2023-12-20 ENCOUNTER — Encounter (INDEPENDENT_AMBULATORY_CARE_PROVIDER_SITE_OTHER): Payer: Medicare Other | Admitting: Ophthalmology

## 2023-12-20 VITALS — Ht 63.0 in | Wt 102.0 lb

## 2023-12-20 DIAGNOSIS — Z0000 Encounter for general adult medical examination without abnormal findings: Secondary | ICD-10-CM | POA: Diagnosis not present

## 2023-12-20 DIAGNOSIS — Z1231 Encounter for screening mammogram for malignant neoplasm of breast: Secondary | ICD-10-CM

## 2023-12-20 NOTE — Patient Instructions (Addendum)
  Ms. Steines , Thank you for taking time to come for your Medicare Wellness Visit. I appreciate your ongoing commitment to your health goals. Please review the following plan we discussed and let me know if I can assist you in the future.   These are the goals we discussed:  Goals      Patient Stated     Maintain current health and independence.      Patient Stated     Drink more water        This is a list of the screening recommended for you and due dates:  Health Maintenance  Topic Date Due   COVID-19 Vaccine (4 - 2024-25 season) 12/19/2024*   Flu Shot  02/22/2024   Medicare Annual Wellness Visit  12/19/2024   Pneumonia Vaccine  Completed   DEXA scan (bone density measurement)  Completed   Zoster (Shingles) Vaccine  Completed   HPV Vaccine  Aged Out   Meningitis B Vaccine  Aged Out   DTaP/Tdap/Td vaccine  Discontinued  *Topic was postponed. The date shown is not the original due date.    I have mailed a packet of advanced directives to you. There is a healthcare power of attorney that you are wanting to update and also other health directives you may want to put in place. Once completed you can bring a copy to our office to place on file.     I look forward to speaking with you next year on 12/23/24 at 11am.

## 2023-12-20 NOTE — Progress Notes (Signed)
 Please attest this visit in the absence of patient primary care provider.    Subjective:   Savannah Jimenez is a 88 y.o. who presents for a Medicare Wellness preventive visit.  As a reminder, Annual Wellness Visits don't include a physical exam, and some assessments may be limited, especially if this visit is performed virtually. We may recommend an in-person follow-up visit with your provider if needed.  Visit Complete: Virtual I connected with  Kris Pester on 12/20/23 by a audio enabled telemedicine application and verified that I am speaking with the correct person using two identifiers.  Patient Location: Home  Provider Location: Home Office  I discussed the limitations of evaluation and management by telemedicine. The patient expressed understanding and agreed to proceed.  Vital Signs: Because this visit was a virtual/telehealth visit, some criteria may be missing or patient reported. Any vitals not documented were not able to be obtained and vitals that have been documented are patient reported.  VideoDeclined- This patient declined Librarian, academic. Therefore the visit was completed with audio only.  Persons Participating in Visit: Patient.  AWV Questionnaire: No: Patient Medicare AWV questionnaire was not completed prior to this visit.  Cardiac Risk Factors include: advanced age (>68men, >25 women);dyslipidemia     Objective:     Today's Vitals   12/20/23 1013 12/20/23 1019  Weight: 102 lb (46.3 kg)   Height: 5\' 3"  (1.6 m)   PainSc:  0-No pain   Body mass index is 18.07 kg/m.     12/20/2023   10:39 AM 08/10/2023    7:32 PM 07/29/2023    3:47 PM 12/19/2022   10:21 AM 12/13/2021   10:25 AM 07/06/2021   11:00 AM 11/12/2020    9:32 AM  Advanced Directives  Does Patient Have a Medical Advance Directive? Yes No No Yes Yes Yes No  Type of Estate agent of [employer] Power of [place];Living will  Healthcare Power of [place];Out of facility DNR (pink MOST or yellow form);Living will Healthcare Power of [place];Living will   Does patient want to make changes to medical advance directive? Yes (MAU/Ambulatory/Procedural Areas - Information given)    No - Patient declined No - Patient declined   Copy of Healthcare Power of Attorney in Chart? Yes - validated most recent copy scanned in chart (See row information)   Yes - validated most recent copy scanned in chart (See row information) Yes - validated most recent copy scanned in chart (See row information) No - copy requested   Would patient like information on creating a medical advance directive?  No - Patient declined No - Patient declined   No - Patient declined     Current Medications (verified) Outpatient Encounter Medications as of 12/20/2023  Medication Sig   albuterol  (VENTOLIN  HFA) 108 (90 Base) MCG/ACT inhaler Inhale 2 puffs into the lungs every 6 (six) hours as needed for wheezing or shortness of breath.   atorvastatin  (LIPITOR) 20 MG tablet Take 1 tablet (20 mg total) by mouth at bedtime.   azithromycin  (ZITHROMAX ) 250 MG tablet Take 2 on the first day and then 1 daily until completely gone   busPIRone  (BUSPAR ) 7.5 MG tablet Take 1 tablet (7.5 mg total) by mouth 2 (two) times daily. (Patient taking differently: Take 7.5 mg by mouth 2 (two) times daily. Takes 1 1/2 every morning and 1 at bedtime)   Cholecalciferol (VITAMIN D ) 50 MCG (2000 UT) CAPS Take 1  capsule by mouth daily.   Cyanocobalamin  (B-12) 1000 MCG TABS Take by mouth.   ELIQUIS  2.5 MG TABS tablet TAKE 1 TABLET BY MOUTH TWICE  DAILY   fish oil-omega-3 fatty acids  1000 MG capsule Take 2 g by mouth daily.   guaiFENesin  (MUCINEX ) 600 MG 12 hr tablet Take 2 tablets (1,200 mg total) by mouth 2 (two) times daily.   ipratropium (ATROVENT  HFA) 17 MCG/ACT inhaler Inhale 1 puff into the lungs 3 (three) times daily.   metoprolol  succinate (TOPROL -XL) 50 MG 24 hr tablet Take 0.5  tablets (25 mg total) by mouth daily. Take with or immediately following a meal.   mometasone -formoterol  (DULERA) 200-5 MCG/ACT AERO Inhale 2 puffs into the lungs 2 (two) times daily.   Multiple Vitamins-Minerals (PRESERVISION AREDS 2) CAPS Take 1 capsule by mouth 2 (two) times daily.    ondansetron  (ZOFRAN ) 4 MG tablet Take 1 tablet (4 mg total) by mouth every 6 (six) hours.   zolpidem  (AMBIEN ) 5 MG tablet TAKE 1 TABLET BY MOUTH AT  BEDTIME AS NEEDED   No facility-administered encounter medications on file as of 12/20/2023.    Allergies (verified) Tetanus toxoids, Penicillins, Sulfacetamide sodium, and Sulfamethoxazole-trimethoprim   History: Past Medical History:  Diagnosis Date   Allergy    Arthritis    BCC (basal cell carcinoma of skin)    GERD (gastroesophageal reflux disease)    Glaucoma suspect    HOH (hard of hearing)    Osteoporosis    Past Surgical History:  Procedure Laterality Date   ABDOMINAL HYSTERECTOMY     still have cervix   ARTERY BIOPSY Right 10/06/2014   Procedure: BIOPSY TEMPORAL ARTERY;  Surgeon: Oralee Billow, MD;  Location: [facility];  Service: General;  Laterality: Right;   BREAST EXCISIONAL BIOPSY Right    over 20 years ago; lymph nodes removed   CATARACT EXTRACTION  10/2011   Left   colonscopy  2007   INNER EAR SURGERY  1974   both stapes replaced-steal   TUBAL LIGATION     Family History  Problem Relation Age of Onset   Heart failure Mother    Hypertension Sister    Asthma Sister    Hypertension Brother    Social History   Socioeconomic History   Marital status: Married    Spouse name: Not on file   Number of children: 2   Years of education: Not on file   Highest education level: 12th grade  Occupational History   Occupation: retired , day care  Tobacco Use   Smoking status: Never   Smokeless tobacco: Never  Vaping Use   Vaping status: Never Used  Substance and Sexual Activity   Alcohol use: Not Currently   Drug  use: No   Sexual activity: Not on file  Other Topics Concern   Not on file  Social History Narrative   Household: pt and husband       2 g- children   2 g-g- children   Social Drivers of Corporate investment banker Strain: Low Risk  (12/20/2023)   Overall Financial Resource Strain (CARDIA)    Difficulty of Paying Living Expenses: Not very hard  Food Insecurity: No Food Insecurity (12/20/2023)   Hunger Vital Sign    Worried About Running Out of Food in the Last Year: Never true    Ran Out of Food in the Last Year: Never true  Transportation Needs: No Transportation Needs (12/20/2023)   PRAPARE - Transportation  Lack of Transportation (Medical): No    Lack of Transportation (Non-Medical): No  Physical Activity: Insufficiently Active (12/20/2023)   Exercise Vital Sign    Days of Exercise per Week: 3 days    Minutes of Exercise per Session: 30 min  Stress: No Stress Concern Present (12/20/2023)   [employer] of Occupational Health - Occupational Stress Questionnaire    Feeling of Stress : Not at all  Social Connections: Moderately Isolated (12/20/2023)   Social Connection and Isolation Panel [NHANES]    Frequency of Communication with Friends and Family: More than three times a week    Frequency of Social Gatherings with Friends and Family: Once a week    Attends Religious Services: Never    Database administrator or Organizations: No    Attends Engineer, structural: Never    Marital Status: Married    Tobacco Counseling Counseling given: Not Answered    Clinical Intake:  Pre-visit preparation completed: Yes  Pain : No/denies pain Pain Score: 0-No pain     BMI - recorded: 18.07 Nutritional Status: BMI <19  Underweight Nutritional Risks: None (hospital in january for flu and pneumonia and lost weight. Eats protein.) Diabetes: No  No results found for: "HGBA1C"   How often do you need to have someone help you when you read instructions, pamphlets, or  other written materials from your doctor or pharmacy?: 1 - Never What is the last grade level you completed in school?: [employer]. No degree  Interpreter Needed?: No  Information entered by :: Susa Engman, CMA   Activities of Daily Living     12/20/2023   10:24 AM 08/11/2023    1:24 PM  In your present state of health, do you have any difficulty performing the following activities:  Hearing? 1 1  Comment wears hearing aids   Vision? 0 0  Comment wears glasses   Difficulty concentrating or making decisions? 0 0  Walking or climbing stairs? 0   Dressing or bathing? 0   Doing errands, shopping? 0 0  Preparing Food and eating ? N   Using the Toilet? N   In the past six months, have you accidently leaked urine? Y   Comment occasional when trying to get to the bathroom   Do you have problems with loss of bowel control? N   Managing your Medications? N   Managing your Finances? N   Housekeeping or managing your Housekeeping? N     Patient Care Team: Ezell Hollow, MD as PCP - General (Internal Medicine) Avanell Leigh, MD as PCP - Cardiology (Cardiology) Rexene Catching, MD as Consulting Physician (Ophthalmology) Worthy Heads, MD as Consulting Physician (Ophthalmology) Thais Fill, MD as Consulting Physician (Dermatology)  Indicate any recent Medical Services you may have received from other than [facility] providers in the past year (date may be approximate).     Assessment:    This is a routine wellness examination for Danika.  Hearing/Vision screen Hearing Screening - Comments:: Wears hearing aids Vision Screening - Comments:: Eye exam last month with Dr Carloyn Chi / [facility]   Goals Addressed             This Visit's Progress    Patient Stated   On track    Maintain current health and independence.      Patient Stated   On track    Drink more water       Depression Screen     12/20/2023  10:36 AM 08/28/2023    2:30 PM 05/01/2023    1:02 PM  03/28/2023    9:24 AM 12/19/2022   10:23 AM 11/22/2022   11:13 AM 05/26/2022    9:18 AM  PHQ 2/9 Scores  PHQ - 2 Score 0 0 0 0 0 0 0    Fall Risk     12/20/2023   10:43 AM 12/13/2023    1:05 PM 08/28/2023    2:30 PM 05/01/2023    1:02 PM 03/28/2023    9:24 AM  Fall Risk   Falls in the past year? 0 0 0 0 0  Number falls in past yr: 0  0 0 0  Injury with Fall? 0  0 0 0  Risk for fall due to : No Fall Risks   No Fall Risks   Follow up Education provided  Falls evaluation completed;Education provided Falls evaluation completed Falls evaluation completed    MEDICARE RISK AT HOME:  Medicare Risk at Home Any stairs in or around the home?: No (uses elevator) If so, are there any without handrails?: No Home free of loose throw rugs in walkways, pet beds, electrical cords, etc?: Yes Adequate lighting in your home to reduce risk of falls?: Yes Life alert?: No Use of a cane, walker or w/c?: Yes (only uses walker when feels it is needed) Grab bars in the bathroom?: Yes Shower chair or bench in shower?: Yes Elevated toilet seat or a handicapped toilet?: Yes  TIMED UP AND GO:  Was the test performed?  No, telephone visit  Cognitive Function: 6CIT completed        12/20/2023   10:45 AM 12/19/2022   10:27 AM 12/13/2021   10:30 AM 07/08/2020   10:38 AM  6CIT Screen  What Year? 0 points 0 points 0 points 0 points  What month? 0 points 0 points 0 points 0 points  What time? 0 points 0 points 0 points 0 points  Count back from 20 0 points 0 points 0 points 0 points  Months in reverse 0 points 0 points 0 points 0 points  Repeat phrase 0 points 0 points 0 points   Total Score 0 points 0 points 0 points     Immunizations Immunization History  Administered Date(s) Administered   Fluad Quad(high Dose 65+) 05/26/2021   Influenza Split 04/26/2022   Influenza Whole 04/05/2010   Influenza, High Dose Seasonal PF 04/30/2014, 05/04/2015, 04/25/2016, 04/11/2019, 04/27/2023   Influenza-Unspecified  05/01/2017, 04/25/2018, 04/23/2020   PFIZER(Purple Top)SARS-COV-2 Vaccination 08/13/2019, 09/01/2019, 06/04/2020   Pneumococcal Conjugate-13 06/03/2014   Pneumococcal Polysaccharide-23 04/19/2009, 01/29/2019   RSV,unspecified 06/29/2022   Zoster Recombinant(Shingrix ) 08/25/2020, 11/09/2020   Zoster, Live 11/04/2007, 12/24/2009    Screening Tests Health Maintenance  Topic Date Due   COVID-19 Vaccine (4 - 2024-25 season) 12/19/2024 (Originally 03/25/2023)   INFLUENZA VACCINE  02/22/2024   Medicare Annual Wellness (AWV)  12/19/2024   Pneumonia Vaccine 13+ Years old  Completed   DEXA SCAN  Completed   Zoster Vaccines- Shingrix   Completed   HPV VACCINES  Aged Out   Meningococcal B Vaccine  Aged Out   DTaP/Tdap/Td  Discontinued    Health Maintenance  There are no preventive care reminders to display for this patient.  Health Maintenance Items Addressed: Pt declines further COVID vaccination.  Additional Screening:  Vision Screening: Recommended annual ophthalmology exams for early detection of glaucoma and other disorders of the eye.  Dental Screening: Recommended annual dental exams for proper oral hygiene  Community  Resource Referral / Chronic Care Management: CRR required this visit?  No   CCM required this visit?  No   Plan:    I have personally reviewed and noted the following in the patient's chart:   Medical and social history Use of alcohol, tobacco or illicit drugs  Current medications and supplements including opioid prescriptions. Patient is not currently taking opioid prescriptions. Functional ability and status Nutritional status Physical activity Advanced directives List of other physicians Hospitalizations, surgeries, and ER visits in previous 12 months Vitals Screenings to include cognitive, depression, and falls Referrals and appointments  In addition, I have reviewed and discussed with patient certain preventive protocols, quality metrics, and  best practice recommendations. A written personalized care plan for preventive services as well as general preventive health recommendations were provided to patient.   Susa Engman, CMA   12/20/2023   After Visit Summary: (MyChart) Due to this being a telephonic visit, the after visit summary with patients personalized plan was offered to patient via MyChart   Notes: Nothing significant to report at this time.

## 2024-01-14 ENCOUNTER — Ambulatory Visit (HOSPITAL_BASED_OUTPATIENT_CLINIC_OR_DEPARTMENT_OTHER)

## 2024-01-15 ENCOUNTER — Ambulatory Visit (INDEPENDENT_AMBULATORY_CARE_PROVIDER_SITE_OTHER): Admitting: Internal Medicine

## 2024-01-15 ENCOUNTER — Encounter: Payer: Self-pay | Admitting: Internal Medicine

## 2024-01-15 VITALS — BP 122/62 | HR 66 | Temp 97.8°F | Resp 16 | Ht 63.0 in | Wt 101.2 lb

## 2024-01-15 DIAGNOSIS — F419 Anxiety disorder, unspecified: Secondary | ICD-10-CM

## 2024-01-15 DIAGNOSIS — I4892 Unspecified atrial flutter: Secondary | ICD-10-CM | POA: Diagnosis not present

## 2024-01-15 DIAGNOSIS — R5383 Other fatigue: Secondary | ICD-10-CM | POA: Diagnosis not present

## 2024-01-15 DIAGNOSIS — D649 Anemia, unspecified: Secondary | ICD-10-CM

## 2024-01-15 LAB — BASIC METABOLIC PANEL WITH GFR
BUN: 24 mg/dL — ABNORMAL HIGH (ref 6–23)
CO2: 31 meq/L (ref 19–32)
Calcium: 9 mg/dL (ref 8.4–10.5)
Chloride: 101 meq/L (ref 96–112)
Creatinine, Ser: 0.7 mg/dL (ref 0.40–1.20)
GFR: 78.8 mL/min (ref >60.00)
Glucose, Bld: 94 mg/dL (ref 70–99)
Potassium: 4.4 meq/L (ref 3.5–5.1)
Sodium: 137 meq/L (ref 135–145)

## 2024-01-15 LAB — TSH: TSH: 2.34 u[IU]/mL (ref 0.35–5.50)

## 2024-01-15 MED ORDER — BUSPIRONE HCL 10 MG PO TABS: 10.0000 mg | ORAL_TABLET | Freq: Two times a day (BID) | ORAL | 1 refills | Status: DC

## 2024-01-15 NOTE — Progress Notes (Unsigned)
 Subjective:    Patient ID: Savannah Jimenez, female    DOB: 03-Aug-1935, 88 y.o.   MRN: [id number]  DOS:  01/15/2024 Type of visit - description: Routine follow-up, here with her daughter  No new concerns. Remains somewhat fatigued, occasional DOE.  Occasional cough with minimal sputum production. Denies any fever or chills. Appetite is slightly decreased, denies postprandial pain but she does feel full easily. No diarrhea, no change in the color of the stools.  No fever or chills. + stress, taking more BuSpar  than prescribed  Wt Readings from Last 3 Encounters:  01/15/24 101 lb 4 oz (45.9 kg)  12/20/23 102 lb (46.3 kg)  12/13/23 102 lb 12.8 oz (46.6 kg)   Review of Systems See above   Past Medical History:  Diagnosis Date   Allergy    Arthritis    BCC (basal cell carcinoma of skin)    GERD (gastroesophageal reflux disease)    Glaucoma suspect    HOH (hard of hearing)    Osteoporosis     Past Surgical History:  Procedure Laterality Date   ABDOMINAL HYSTERECTOMY     still have cervix   ARTERY BIOPSY Right 10/06/2014   Procedure: BIOPSY TEMPORAL ARTERY;  Surgeon: Krystal Spinner, MD;  Location: [facility];  Service: General;  Laterality: Right;   BREAST EXCISIONAL BIOPSY Right    over 20 years ago; lymph nodes removed   CATARACT EXTRACTION  10/2011   Left   colonscopy  2007   INNER EAR SURGERY  1974   both stapes replaced-steal   TUBAL LIGATION      Current Outpatient Medications  Medication Instructions   albuterol  (VENTOLIN  HFA) 108 (90 Base) MCG/ACT inhaler 2 puffs, Inhalation, Every 6 hours PRN   atorvastatin  (LIPITOR) 20 mg, Oral, Daily at bedtime   busPIRone  (BUSPAR ) 10 mg, Oral, 2 times daily   Cholecalciferol (VITAMIN D ) 50 MCG (2000 UT) CAPS 1 capsule, Daily   Cyanocobalamin  (B-12) 1000 MCG TABS Take by mouth.   ELIQUIS  2.5 MG TABS tablet TAKE 1 TABLET BY MOUTH TWICE  DAILY   fish oil-omega-3 fatty acids  2 g, Daily   guaiFENesin  (MUCINEX ) 1,200  mg, Oral, 2 times daily   ipratropium (ATROVENT  HFA) 17 MCG/ACT inhaler 1 puff, Inhalation, 3 times daily   metoprolol  succinate (TOPROL -XL) 25 mg, Oral, Daily, Take with or immediately following a meal.   Multiple Vitamins-Minerals (PRESERVISION AREDS 2) CAPS 1 capsule, 2 times daily   ondansetron  (ZOFRAN ) 4 mg, Oral, Every 6 hours   zolpidem  (AMBIEN ) 5 mg, Oral, At bedtime PRN       Objective:   Physical Exam BP 122/62   Pulse 66   Temp 97.8 F (36.6 C) (Oral)   Resp 16   Ht 5' 3 (1.6 m)   Wt 101 lb 4 oz (45.9 kg)   SpO2 97%   BMI 17.94 kg/m  General:   Well developed, NAD, BMI noted. HEENT:  Normocephalic . Face symmetric, atraumatic Lungs:  CTA B Normal respiratory effort, no intercostal retractions, no accessory muscle use. Heart: RRR,  no murmur.  Lower extremities: no pretibial edema bilaterally  Skin: Not pale. Not jaundice Neurologic:  alert & oriented X3.  Speech normal, gait appropriate for age and unassisted Psych--  Cognition and judgment appear intact.  Cooperative with normal attention span and concentration.  Behavior appropriate. No anxious or depressed appearing.      Assessment   Assessment, transferring from Dr. Esta (01/2018)  Allergies GERD Anxiety  depression Atrial fibrillation/flutter Dx 2022 TIA: Dx 2022. Osteoporosis: - rx  actonel  before  -Tscore  -2.2 (10-2017), -2.3 (June 2023), declines treatment as of 03/28/2023.  Aware of risks. Palpable abdominal aorta: Abdominal ultrasound  09/2018 negative for AAA B12 deficiency Pulmonary nodule - sees  pulmonary H/o polymyalgia rheumatica; used to see Dr Everlean, Savannah Jimenez. Bx 2016 Ear implants: No MRI Pectus excavatum-- chronic DOE   PLAN Anemia: See LOV, last CBC and ferritin WNL. Pulmonary nodules.  LOV with pulmonary was a month ago, possibly [facility], they plan to follow-up in 3 months.  Currently not using any inhalers. Atrial fibrillation: Saw cardiology last month, maintaining sinus  rhythm, on Eliquis .  Check a BMP and a TSH Fatigue: Chronic, unchanged, vital signs stable, not losing weight.  Continue observation. Anxiety: Increase stress, mostly related to her husband's health.  Previously on BuSpar  7.5 twice daily, she added half tablet in the mornings thus we agreed on increase BuSpar  to 10 mg twice daily.  Prescription sent. RTC 4 months

## 2024-01-15 NOTE — Patient Instructions (Signed)
   GO TO THE LAB :  Get the blood work   Your results will be posted on MyChart with my comments  Next office visit for a checkup in 3 to 4 months Please make an appointment before you leave today

## 2024-01-16 ENCOUNTER — Ambulatory Visit: Payer: Self-pay | Admitting: Internal Medicine

## 2024-01-16 NOTE — Assessment & Plan Note (Signed)
 Anemia: See LOV, last CBC and ferritin WNL. Pulmonary nodules.  LOV with pulmonary was a month ago, possibly [facility], they plan to follow-up in 3 months.  Currently not using any inhalers. Atrial fibrillation: Saw cardiology last month, maintaining sinus rhythm, on Eliquis .  Check a BMP and a TSH Fatigue: Chronic, unchanged, vital signs stable, not losing weight.  Continue observation. Anxiety: Increase stress, mostly related to her husband's health.  Previously on BuSpar  7.5 twice daily, she added half tablet in the mornings thus we agreed on increase BuSpar  to 10 mg twice daily.  Prescription sent. RTC 4 months

## 2024-01-18 ENCOUNTER — Telehealth: Payer: Self-pay | Admitting: Cardiovascular Disease

## 2024-01-18 MED ORDER — METOPROLOL SUCCINATE ER 50 MG PO TB24: 50.0000 mg | ORAL_TABLET | Freq: Every day | ORAL | 3 refills | Status: DC

## 2024-01-18 NOTE — Telephone Encounter (Signed)
 Called patient back to let her know Dr. Court is fine with changing back to metoprolol  50 mg daily. Patient verbalized understanding and wanted it sent to [facility].

## 2024-01-18 NOTE — Telephone Encounter (Signed)
*  STAT* If patient is at the pharmacy, call can be transferred to refill team.   1. Which medications need to be refilled? (please list name of each medication and dose if known) metoprolol  succinate (TOPROL -XL) 50 MG 24 hr tablet  2. Which pharmacy/location (including street and city if local pharmacy) is medication to be sent to? [facility] - [place], [place] - [address] 3. Do they need a 30 day or 90 day supply?  90 day supply   Patient says she was taking 1/2 tablet, but it wasn't working. She increased it to 1 whole tablet. She is requesting a new prescription for her to take the entire tablet. Please assist.

## 2024-01-18 NOTE — Telephone Encounter (Signed)
 Patient says she was taking 1/2 tablet, but it wasn't working. She increased it to 1 whole tablet. She is requesting a new prescription for her to take the entire tablet. Would Dr. Court like to change directions for pt to take 1 tablet daily? Please address

## 2024-02-18 ENCOUNTER — Other Ambulatory Visit

## 2024-02-21 ENCOUNTER — Ambulatory Visit (HOSPITAL_BASED_OUTPATIENT_CLINIC_OR_DEPARTMENT_OTHER)
Admission: RE | Admit: 2024-02-21 | Discharge: 2024-02-21 | Disposition: A | Discharge status: Home / Self Care | Source: Ambulatory Visit | Attending: Internal Medicine | Admitting: Internal Medicine

## 2024-02-21 ENCOUNTER — Ambulatory Visit (HOSPITAL_BASED_OUTPATIENT_CLINIC_OR_DEPARTMENT_OTHER)
Admission: RE | Admit: 2024-02-21 | Discharge: 2024-02-21 | Disposition: A | Discharge status: Home / Self Care | Source: Ambulatory Visit | Attending: Emergency Medicine | Admitting: Emergency Medicine

## 2024-02-21 ENCOUNTER — Encounter (HOSPITAL_BASED_OUTPATIENT_CLINIC_OR_DEPARTMENT_OTHER): Payer: Self-pay

## 2024-02-21 DIAGNOSIS — Z1231 Encounter for screening mammogram for malignant neoplasm of breast: Secondary | ICD-10-CM

## 2024-02-21 DIAGNOSIS — R911 Solitary pulmonary nodule: Secondary | ICD-10-CM | POA: Diagnosis present

## 2024-02-25 ENCOUNTER — Ambulatory Visit: Payer: Self-pay | Admitting: *Deleted

## 2024-02-25 NOTE — Telephone Encounter (Signed)
 Appt scheduled

## 2024-02-25 NOTE — Telephone Encounter (Signed)
 FYI Only or Action Required?: Action required by provider: request for appointment.  Patient was last seen in primary care on 01/15/2024 by Amon Aloysius BRAVO, MD.  Called Nurse Triage reporting Urinary Frequency.  Symptoms began yesterday.  Interventions attempted: Rest, hydration, or home remedies.  Symptoms are: gradually worsening.  Triage Disposition: See Physician Within 24 Hours  Patient/caregiver understands and will follow disposition?: Yes               Copied from CRM [id number]. Topic: Clinical - Red Word Triage >> Feb 25, 2024 10:36 AM Marissa P wrote: Red Word that prompted transfer to Nurse Triage: Patient has symptoms of uti or bladder infection maybe because she is experiencing frequent urination. Reason for Disposition  Urinating more frequently than usual (i.e., frequency) OR new-onset of the feeling of an urgent need to urinate (i.e., urgency)  Answer Assessment - Initial Assessment Questions Appt scheduled with other provider tomorrow. None available with PCP until Friday .       1. SYMPTOM: What's the main symptom you're concerned about? (e.g., frequency, incontinence)     Frequency  2. ONSET: When did the  sx   start?     Yesterday  3. PAIN: Is there any pain? If Yes, ask: How bad is it? (Scale: 1-10; mild, moderate, severe)     No  4. CAUSE: What do you think is causing the symptoms?     UTI 5. OTHER SYMPTOMS: Do you have any other symptoms? (e.g., blood in urine, fever, flank pain, pain with urination)     Urinary frequency feels urge to urinate after completing urination and drops noted  6. PREGNANCY: Is there any chance you are pregnant? When was your last menstrual period?     na  Protocols used: Urinary Symptoms-A-AH

## 2024-02-26 ENCOUNTER — Encounter: Payer: Self-pay | Admitting: Family

## 2024-02-26 ENCOUNTER — Ambulatory Visit (INDEPENDENT_AMBULATORY_CARE_PROVIDER_SITE_OTHER): Admitting: Family

## 2024-02-26 VITALS — BP 132/67 | HR 58 | Temp 97.8°F | Resp 16 | Ht 63.0 in | Wt 102.6 lb

## 2024-02-26 DIAGNOSIS — R3915 Urgency of urination: Secondary | ICD-10-CM | POA: Insufficient documentation

## 2024-02-26 LAB — POC URINALSYSI DIPSTICK (AUTOMATED)
Bilirubin, UA: NEGATIVE
Blood, UA: NEGATIVE
Glucose, UA: NEGATIVE
Ketones, UA: NEGATIVE
Nitrite, UA: NEGATIVE
Protein, UA: POSITIVE — AB
Spec Grav, UA: 1.02 (ref 1.010–1.025)
Urobilinogen, UA: 0.2 U/dL
pH, UA: 5 (ref 5.0–8.0)

## 2024-02-26 NOTE — Progress Notes (Signed)
 Subjective:     Patient ID: Savannah Jimenez, female    DOB: [birth year], 88 y.o.   MRN: [id number]  Chief Complaint  Patient presents with   Urinary Urgency    Patient reports urinary urgency with frequency on Saturday    HPI  Discussed the use of AI scribe software for clinical note transcription with the patient, who gave verbal consent to proceed.  History of Present Illness   Savannah Jimenez is an 88 year old female who presents with urinary issues since Saturday. She  reports that she developed urinary urgency and frequency on Saturday. There is no burning sensation, back pain, or abdominal pain. She has had only one prior urinary infection. Her symptoms have resolved today.    Health Maintenance Due  Topic Date Due   INFLUENZA VACCINE  02/22/2024    Past Medical History:  Diagnosis Date   Allergy    Arthritis    BCC (basal cell carcinoma of skin)    GERD (gastroesophageal reflux disease)    Glaucoma suspect    HOH (hard of hearing)    Osteoporosis     Past Surgical History:  Procedure Laterality Date   ABDOMINAL HYSTERECTOMY     still have cervix   ARTERY BIOPSY Right 10/06/2014   Procedure: BIOPSY TEMPORAL ARTERY;  Surgeon: Krystal Spinner, MD;  Location: [facility];  Service: General;  Laterality: Right;   BREAST EXCISIONAL BIOPSY Right    over 20 years ago; lymph nodes removed   CATARACT EXTRACTION  10/2011   Left   colonscopy  2007   INNER EAR SURGERY  1974   both stapes replaced-steal   TUBAL LIGATION      Family History  Problem Relation Age of Onset   Heart failure Mother    Hypertension Sister    Asthma Sister    Hypertension Brother     Social History   Socioeconomic History   Marital status: Married    Spouse name: Not on file   Number of children: 2   Years of education: Not on file   Highest education level: 12th grade  Occupational History   Occupation: retired , day care  Tobacco Use   Smoking status: Never    Smokeless tobacco: Never  Vaping Use   Vaping status: Never Used  Substance and Sexual Activity   Alcohol use: Not Currently   Drug use: No   Sexual activity: Not on file  Other Topics Concern   Not on file  Social History Narrative   Household: pt and husband       2 g- children   2 g-g- children   Social Drivers of Corporate investment banker Strain: Low Risk  (12/20/2023)   Overall Financial Resource Strain (CARDIA)    Difficulty of Paying Living Expenses: Not very hard  Food Insecurity: No Food Insecurity (12/20/2023)   Hunger Vital Sign    Worried About Running Out of Food in the Last Year: Never true    Ran Out of Food in the Last Year: Never true  Transportation Needs: No Transportation Needs (12/20/2023)   PRAPARE - Administrator, Civil Service (Medical): No    Lack of Transportation (Non-Medical): No  Physical Activity: Unknown (01/14/2024)   Exercise Vital Sign    Days of Exercise per Week: 3 days    Minutes of Exercise per Session: Not on file  Recent Concern: Physical Activity - Insufficiently Active (12/20/2023)   Exercise Vital  Sign    Days of Exercise per Week: 3 days    Minutes of Exercise per Session: 30 min  Stress: No Stress Concern Present (12/20/2023)   [employer] of Occupational Health - Occupational Stress Questionnaire    Feeling of Stress : Not at all  Social Connections: Unknown (01/14/2024)   Social Connection and Isolation Panel    Frequency of Communication with Friends and Family: Not on file    Frequency of Social Gatherings with Friends and Family: Not on file    Attends Religious Services: Not on file    Active Member of Clubs or Organizations: No    Attends Banker Meetings: Not on file    Marital Status: Not on file  Recent Concern: Social Connections - Moderately Isolated (12/20/2023)   Social Connection and Isolation Panel    Frequency of Communication with Friends and Family: More than three times a week     Frequency of Social Gatherings with Friends and Family: Once a week    Attends Religious Services: Never    Database administrator or Organizations: No    Attends Banker Meetings: Never    Marital Status: Married  Catering manager Violence: Not At Risk (12/20/2023)   Humiliation, Afraid, Rape, and Kick questionnaire    Fear of Current or Ex-Partner: No    Emotionally Abused: No    Physically Abused: No    Sexually Abused: No    Outpatient Medications Prior to Visit  Medication Sig Dispense Refill   albuterol  (VENTOLIN  HFA) 108 (90 Base) MCG/ACT inhaler Inhale 2 puffs into the lungs every 6 (six) hours as needed for wheezing or shortness of breath. 8 g 2   atorvastatin  (LIPITOR) 20 MG tablet Take 1 tablet (20 mg total) by mouth at bedtime. 90 tablet 1   busPIRone  (BUSPAR ) 10 MG tablet Take 1 tablet (10 mg total) by mouth 2 (two) times daily. 180 tablet 1   Cholecalciferol (VITAMIN D ) 50 MCG (2000 UT) CAPS Take 1 capsule by mouth daily.     Cyanocobalamin  (B-12) 1000 MCG TABS Take by mouth.     ELIQUIS  2.5 MG TABS tablet TAKE 1 TABLET BY MOUTH TWICE  DAILY 180 tablet 3   fish oil-omega-3 fatty acids  1000 MG capsule Take 2 g by mouth daily.     guaiFENesin  (MUCINEX ) 600 MG 12 hr tablet Take 2 tablets (1,200 mg total) by mouth 2 (two) times daily. 120 tablet 0   ipratropium (ATROVENT  HFA) 17 MCG/ACT inhaler Inhale 1 puff into the lungs 3 (three) times daily. 1 each 2   metoprolol  succinate (TOPROL -XL) 50 MG 24 hr tablet Take 1 tablet (50 mg total) by mouth daily. Take with or immediately following a meal. 90 tablet 3   Multiple Vitamins-Minerals (PRESERVISION AREDS 2) CAPS Take 1 capsule by mouth 2 (two) times daily.      ondansetron  (ZOFRAN ) 4 MG tablet Take 1 tablet (4 mg total) by mouth every 6 (six) hours. 12 tablet 0   zolpidem  (AMBIEN ) 5 MG tablet TAKE 1 TABLET BY MOUTH AT  BEDTIME AS NEEDED 90 tablet 0   No facility-administered medications prior to visit.     Allergies  Allergen Reactions   Tetanus Toxoids     Arm swelling    Penicillins     PCN reaction: big red spot with shot in high school.  No s/s anaphylaxis.  Tolerated oral cephalosporin in 2013.   Sulfacetamide Sodium    Sulfamethoxazole-Trimethoprim Other (See Comments)  ROS See HPI    Objective:    Physical Exam Constitutional:      General: She is not in acute distress.    Appearance: Normal appearance. She is well-developed.  HENT:     Head: Normocephalic and atraumatic.     Right Ear: External ear normal.     Left Ear: External ear normal.  Eyes:     General: No scleral icterus. Neck:     Thyroid : No thyromegaly.  Cardiovascular:     Rate and Rhythm: Normal rate and regular rhythm.     Heart sounds: Normal heart sounds. No murmur heard. Pulmonary:     Effort: Pulmonary effort is normal. No respiratory distress.     Breath sounds: Normal breath sounds. No wheezing.  Abdominal:     Palpations: Abdomen is soft.     Tenderness: There is no abdominal tenderness. There is no right CVA tenderness or left CVA tenderness.  Musculoskeletal:     Cervical back: Neck supple.  Skin:    General: Skin is warm and dry.  Neurological:     Mental Status: She is alert and oriented to person, place, and time.  Psychiatric:        Mood and Affect: Mood normal.        Behavior: Behavior normal.        Thought Content: Thought content normal.        Judgment: Judgment normal.      BP 132/67 (BP Location: Right Arm, Patient Position: Sitting, Cuff Size: Small)   Pulse (!) 58   Temp 97.8 F (36.6 C) (Oral)   Resp 16   Ht 5' 3 (1.6 m)   Wt 102 lb 9.6 oz (46.5 kg)   SpO2 100%   BMI 18.17 kg/m  Wt Readings from Last 3 Encounters:  02/26/24 102 lb 9.6 oz (46.5 kg)  01/15/24 101 lb 4 oz (45.9 kg)  12/20/23 102 lb (46.3 kg)       Assessment & Plan:   Problem List Items Addressed This Visit       Unprioritized   Urinary urgency - Primary   Resolved. UA +  leuks and + protein (chronic per review of previous UA's).  Since symptoms are resolved, will send urine for culture prior to starting antibiotics. She is advised to let me know if she develops recurrent symptoms.       Relevant Orders   POCT Urinalysis Dipstick (Automated) (Completed)   Urine Culture    I am having [employer] maintain her fish oil-omega-3 fatty acids , B-12, PreserVision AREDS 2, Vitamin D , Eliquis , ondansetron , atorvastatin , guaiFENesin , albuterol , ipratropium, zolpidem , busPIRone , and metoprolol  succinate.  No orders of the defined types were placed in this encounter.

## 2024-02-26 NOTE — Assessment & Plan Note (Signed)
 Resolved. UA + leuks and + protein (chronic per review of previous UA's).  Since symptoms are resolved, will send urine for culture prior to starting antibiotics. She is advised to let me know if she develops recurrent symptoms.

## 2024-02-26 NOTE — Patient Instructions (Signed)
 VISIT SUMMARY:  You visited us  today due to experiencing urinary urgency and frequency since Saturday. There was no burning sensation, back pain, or abdominal pain, and you have had only one prior urinary infection.  YOUR PLAN:  URINARY SYMPTOMS: You have been experiencing urinary urgency and frequency without any burning sensation or back pain. The initial urinalysis was inconclusive for a urinary tract infection (UTI). -We have sent your urine sample for a culture test to check for infection. -If the culture test is positive, we will start you on antibiotics. -If the culture test is negative, we will inform you of the results through MyChart.

## 2024-02-28 ENCOUNTER — Telehealth: Payer: Self-pay | Admitting: Family

## 2024-02-28 MED ORDER — CEPHALEXIN 500 MG PO CAPS: 500.0000 mg | ORAL_CAPSULE | Freq: Two times a day (BID) | ORAL | 0 refills | Status: AC

## 2024-02-28 NOTE — Telephone Encounter (Signed)
 Urine culture confirms UTI.  Begin Keflex .

## 2024-02-28 NOTE — Telephone Encounter (Signed)
Patient notified of results and new prescription.

## 2024-02-29 ENCOUNTER — Ambulatory Visit: Payer: Self-pay | Admitting: Family

## 2024-02-29 LAB — URINE CULTURE
MICRO NUMBER:: 16789036
SPECIMEN QUALITY:: ADEQUATE

## 2024-03-04 ENCOUNTER — Ambulatory Visit: Payer: Self-pay | Admitting: Emergency Medicine

## 2024-03-06 NOTE — Telephone Encounter (Signed)
 Copied from CRM [id number]. Topic: Clinical - Lab/Test Results >> Mar 06, 2024 11:15 AM Joesph PARAS wrote: Reason for CRM: Patient is calling back for results, patient does not express understanding that PAS is asking if she would like for PAS to provide results verbatim, have a nurse call, or mark results as received since she has read them on MyChart. Patient continues to repeat I will do whatever Dr. Shelah wants me to do. Patient did not answer inquiry. Please advise patient of plan of care going forward, as patient did not acknowledge PAS verbatim attempt or voice understanding.  Spoke with patient regarding prior message . Patient has read her result's on mychart and patient has been scheduled for a f/u visit with Dr.Byrum . Patient's voice was understanding.Nothing else further needed

## 2024-03-10 ENCOUNTER — Encounter: Payer: Self-pay | Admitting: Emergency Medicine

## 2024-03-10 DIAGNOSIS — R918 Other nonspecific abnormal finding of lung field: Secondary | ICD-10-CM

## 2024-03-13 ENCOUNTER — Other Ambulatory Visit (HOSPITAL_BASED_OUTPATIENT_CLINIC_OR_DEPARTMENT_OTHER): Payer: Self-pay

## 2024-03-13 NOTE — Telephone Encounter (Signed)
 I ordered her next CT chest. Please cancel her upcoming appt.Savannah Jimenez

## 2024-03-17 NOTE — Telephone Encounter (Signed)
Appt cancelled and pt is aware °

## 2024-03-27 ENCOUNTER — Emergency Department (HOSPITAL_BASED_OUTPATIENT_CLINIC_OR_DEPARTMENT_OTHER)

## 2024-03-27 ENCOUNTER — Encounter (HOSPITAL_BASED_OUTPATIENT_CLINIC_OR_DEPARTMENT_OTHER): Payer: Self-pay | Admitting: Emergency Medicine

## 2024-03-27 ENCOUNTER — Emergency Department (HOSPITAL_BASED_OUTPATIENT_CLINIC_OR_DEPARTMENT_OTHER)
Admission: EM | Admit: 2024-03-27 | Discharge: 2024-03-27 | Disposition: A | Discharge status: Home / Self Care | Attending: Emergency Medicine | Admitting: Emergency Medicine

## 2024-03-27 ENCOUNTER — Other Ambulatory Visit: Payer: Self-pay

## 2024-03-27 DIAGNOSIS — Z7901 Long term (current) use of anticoagulants: Secondary | ICD-10-CM | POA: Insufficient documentation

## 2024-03-27 DIAGNOSIS — Z85828 Personal history of other malignant neoplasm of skin: Secondary | ICD-10-CM | POA: Insufficient documentation

## 2024-03-27 DIAGNOSIS — H532 Diplopia: Secondary | ICD-10-CM | POA: Diagnosis present

## 2024-03-27 LAB — TROPONIN T, HIGH SENSITIVITY: Troponin T High Sensitivity: <15 ng/L (ref 0–19)

## 2024-03-27 LAB — COMPREHENSIVE METABOLIC PANEL WITH GFR
ALT: 22 U/L (ref 0–44)
AST: 33 U/L (ref 15–41)
Albumin: 4.3 g/dL (ref 3.5–5.0)
Alkaline Phosphatase: 65 U/L (ref 38–126)
Anion gap: 11 (ref 5–15)
BUN: 20 mg/dL (ref 8–23)
CO2: 26 mmol/L (ref 22–32)
Calcium: 9.3 mg/dL (ref 8.9–10.3)
Chloride: 102 mmol/L (ref 98–111)
Creatinine, Ser: 0.63 mg/dL (ref 0.44–1.00)
GFR, Estimated: >60 mL/min (ref >60)
Glucose, Bld: 85 mg/dL (ref 70–99)
Potassium: 4.1 mmol/L (ref 3.5–5.1)
Sodium: 139 mmol/L (ref 135–145)
Total Bilirubin: 0.7 mg/dL (ref 0.0–1.2)
Total Protein: 7 g/dL (ref 6.5–8.1)

## 2024-03-27 LAB — URINALYSIS, ROUTINE W REFLEX MICROSCOPIC
Bilirubin Urine: NEGATIVE
Glucose, UA: NEGATIVE mg/dL
Ketones, ur: 15 mg/dL — AB
Leukocytes,Ua: NEGATIVE
Nitrite: NEGATIVE
Protein, ur: NEGATIVE mg/dL
Specific Gravity, Urine: 1.02 (ref 1.005–1.030)
pH: 6 (ref 5.0–8.0)

## 2024-03-27 LAB — CBC WITH DIFFERENTIAL/PLATELET
Abs Immature Granulocytes: 0.01 K/uL (ref 0.00–0.07)
Basophils Absolute: 0 K/uL (ref 0.0–0.1)
Basophils Relative: 0 %
Eosinophils Absolute: 0 K/uL (ref 0.0–0.5)
Eosinophils Relative: 0 %
HCT: 35.7 % — ABNORMAL LOW (ref 36.0–46.0)
Hemoglobin: 11.6 g/dL — ABNORMAL LOW (ref 12.0–15.0)
Immature Granulocytes: 0 %
Lymphocytes Relative: 29 %
Lymphs Abs: 1.4 K/uL (ref 0.7–4.0)
MCH: 30.7 pg (ref 26.0–34.0)
MCHC: 32.5 g/dL (ref 30.0–36.0)
MCV: 94.4 fL (ref 80.0–100.0)
Monocytes Absolute: 0.5 K/uL (ref 0.1–1.0)
Monocytes Relative: 10 %
Neutro Abs: 2.9 K/uL (ref 1.7–7.7)
Neutrophils Relative %: 61 %
Platelets: 198 K/uL (ref 150–400)
RBC: 3.78 MIL/uL — ABNORMAL LOW (ref 3.87–5.11)
RDW: 14.2 % (ref 11.5–15.5)
WBC: 4.8 K/uL (ref 4.0–10.5)
nRBC: 0 % (ref 0.0–0.2)

## 2024-03-27 LAB — URINALYSIS, MICROSCOPIC (REFLEX)

## 2024-03-27 MED ORDER — IOHEXOL 350 MG/ML SOLN
75.0000 mL | Freq: Once | INTRAVENOUS | Status: AC | PRN
Start: 2024-03-27 — End: 2024-03-27
  Administered 2024-03-27: 75 mL via INTRAVENOUS

## 2024-03-27 NOTE — ED Provider Notes (Signed)
 Savannah Jimenez Provider Note   CSN: [id number] Arrival date & time: 03/27/24  1313     Patient presents with: Weakness   Savannah Jimenez is a 88 y.o. female.   Patient here after episode of double vision that started about an hour ago.  History of the same.  She is on Eliquis .  Family states history of TIA.  History of basal cell carcinoma reflux.  She has been under some stress here today as her husband went to the hospital with a stroke yesterday.  She has been maybe dealing with some low blood pressure recently but blood pressure was normal in triage.  She is not having any cough sputum production fever or chills.  No pain with urination.  She states that when she looks at far objects she has double vision in both eyes when she covers and eye gets better.  She especially noticed it with the lines in the road while driving.  She denies any chest pain shortness of breath weakness numbness tingling.  No speech changes.  No facial droop.  No coordination issues.  The history is provided by the patient.       Prior to Admission medications   Medication Sig Start Date End Date Taking? Authorizing Provider  albuterol  (VENTOLIN  HFA) 108 (90 Base) MCG/ACT inhaler Inhale 2 puffs into the lungs every 6 (six) hours as needed for wheezing or shortness of breath. 08/18/23   Regalado, Belkys A, MD  atorvastatin  (LIPITOR) 20 MG tablet Take 1 tablet (20 mg total) by mouth at bedtime. 08/09/23   Amon Aloysius BRAVO, MD  busPIRone  (BUSPAR ) 10 MG tablet Take 1 tablet (10 mg total) by mouth 2 (two) times daily. 01/15/24   Amon Aloysius BRAVO, MD  Cholecalciferol (VITAMIN D ) 50 MCG (2000 UT) CAPS Take 1 capsule by mouth daily.    [provider]  Cyanocobalamin  (B-12) 1000 MCG TABS Take by mouth.    [provider]  ELIQUIS  2.5 MG TABS tablet TAKE 1 TABLET BY MOUTH TWICE  DAILY 01/22/23   Court Dorn PARAS, MD  fish oil-omega-3 fatty acids  1000 MG capsule Take 2 g by  mouth daily.    [provider]  guaiFENesin  (MUCINEX ) 600 MG 12 hr tablet Take 2 tablets (1,200 mg total) by mouth 2 (two) times daily. 08/18/23   Regalado, Belkys A, MD  ipratropium (ATROVENT  HFA) 17 MCG/ACT inhaler Inhale 1 puff into the lungs 3 (three) times daily. 08/18/23 08/17/24  Regalado, Belkys A, MD  metoprolol  succinate (TOPROL -XL) 50 MG 24 hr tablet Take 1 tablet (50 mg total) by mouth daily. Take with or immediately following a meal. 01/18/24   Court Dorn PARAS, MD  Multiple Vitamins-Minerals (PRESERVISION AREDS 2) CAPS Take 1 capsule by mouth 2 (two) times daily.     [provider]  ondansetron  (ZOFRAN ) 4 MG tablet Take 1 tablet (4 mg total) by mouth every 6 (six) hours. 07/29/23   Jerral Meth, MD  zolpidem  (AMBIEN ) 5 MG tablet TAKE 1 TABLET BY MOUTH AT  BEDTIME AS NEEDED 12/12/23   Paz, Jose E, MD    Allergies: Tetanus toxoid-containing vaccines, Penicillins, Sulfacetamide sodium, and Sulfamethoxazole-trimethoprim    Review of Systems  Updated Vital Signs BP 136/65   Pulse 67   Temp 98.1 F (36.7 C) (Oral)   Resp 18   Wt 46.3 kg   SpO2 97%   BMI 18.07 kg/m   Physical Exam Vitals and nursing note reviewed.  Constitutional:      General: She is not in acute distress.    Appearance: She is well-developed. She is not ill-appearing.  HENT:     Head: Normocephalic and atraumatic.     Nose: Nose normal.     Mouth/Throat:     Mouth: Mucous membranes are moist.  Eyes:     Extraocular Movements: Extraocular movements intact.     Conjunctiva/sclera: Conjunctivae normal.     Pupils: Pupils are equal, round, and reactive to light.  Cardiovascular:     Rate and Rhythm: Normal rate and regular rhythm.     Pulses: Normal pulses.     Heart sounds: Normal heart sounds. No murmur heard. Pulmonary:     Effort: Pulmonary effort is normal. No respiratory distress.     Breath sounds: Normal breath sounds.  Abdominal:     Palpations: Abdomen is soft.      Tenderness: There is no abdominal tenderness.  Musculoskeletal:        General: No swelling. Normal range of motion.     Cervical back: Normal range of motion and neck supple.  Skin:    General: Skin is warm and dry.     Capillary Refill: Capillary refill takes less than 2 seconds.  Neurological:     General: No focal deficit present.     Mental Status: She is alert and oriented to person, place, and time.     Cranial Nerves: No cranial nerve deficit.     Sensory: No sensory deficit.     Motor: No weakness.     Coordination: Coordination normal.     Comments: Normal visual fields, 5+ out of 5 strength, normal coordination, normal sensation, no drift, normal finger-nose-finger, normal speech, do not appreciate any double vision but when I have her stare off and an object about 15 to 20 feet away she starts to see double vision that gets better when she covers one eye  Psychiatric:        Mood and Affect: Mood normal.     (all labs ordered are listed, but only abnormal results are displayed) Labs Reviewed  CBC WITH DIFFERENTIAL/PLATELET - Abnormal; Notable for the following components:      Result Value   RBC 3.78 (*)    Hemoglobin 11.6 (*)    HCT 35.7 (*)    All other components within normal limits  COMPREHENSIVE METABOLIC PANEL WITH GFR  URINALYSIS, ROUTINE W REFLEX MICROSCOPIC  TROPONIN T, HIGH SENSITIVITY    EKG: EKG Interpretation Date/Time:  Thursday March 27 2024 13:59:34 EDT Ventricular Rate:  71 PR Interval:  218 QRS Duration:  89 QT Interval:  406 QTC Calculation: 442 R Axis:   122  Text Interpretation: Sinus rhythm Confirmed by Savannah Jimenez [phone]) on 03/27/2024 2:26:44 PM  Radiology: No results found.   Procedures   Medications Ordered in the ED - No data to display                                  Medical Decision Making Amount and/or Complexity of Data Reviewed Labs: ordered. Radiology: ordered.   Savannah Jimenez is here with double vision,  weakness.  She has a history of TIA she is on Eliquis  A-fib history.  She has been under some stress here the last day or so as her husband's been admitted to the hospital for stroke.  Supposedly maybe some low blood pressure at her  independent living facility but question if maybe blood pressure cuff issue.  She has normal blood pressure here.  She is not endorsing any infectious symptoms.  She noticed that when she was driving this morning the lines on the road or double with both eyes open but when she covered 1 eye it got better.  On my exam she has normal visual fields.  Her neuroexam is unremarkable.  When she looks at objects far away she thinks that she sees double vision with both eyes open but gets better with each eye covered.  She is not having any speech changes vision changes otherwise.  No weakness numbness tingling.  She has a history of TIA supposedly with similar symptoms.  But she cannot get an MRI because she has metal in her ears.  She is overall well-appearing now.  No chest pain or shortness of breath.  Ultimately will get CBC CMP troponin EKG CTA of head and neck.  Will talk with neurology.  Overall EKG shows sinus rhythm.  No ischemic changes.    Troponins unremarkable.  No significant leukocytosis anemia or electrolyte abnormality per my review and interpretation of labs.  Talked with Dr. Michaela with neurology.  She is no longer having any symptoms.  Given that she is already on medications to help prevent stroke we will just pursue CTA of head and neck to look for any other acute abnormality.  If unremarkable anticipate follow-up with her neurology and primary care team outpatient.  This chart was dictated using voice recognition software.  Despite best efforts to proofread,  errors can occur which can change the documentation meaning.      Final diagnoses:  Double vision    ED Discharge Orders     None          Savannah Cornet, DO 03/27/24 1519

## 2024-03-27 NOTE — ED Notes (Signed)
 Dc instructions given, pt verbalized understanding. Out of ED with all belongings and paperwork with steady gait not in visible distress.

## 2024-03-27 NOTE — ED Notes (Signed)
Patient transported to CT via WC

## 2024-03-27 NOTE — ED Triage Notes (Signed)
 Pt from [facility] independent living , brought by daughter stating that patient was hypotensive yesterday at the facility .  Pt feeling unsteady and lethargic . Pt takes care of her husband who was admitted yesterday for stroke . Pt going through a lot os stress .  Pt alert and oriented x 4 .  Denies dizziness or headache .  Yet reports had double vision for far object , today at 1200 noon . Not present during triage

## 2024-03-27 NOTE — ED Notes (Signed)
 Around noon today Pt. Report she had double vision that is only at a distance.  Pt. Had this happen to her several years ago.  She  was seen by Ophthalmologist and was told poss. Stroke in the eye muscle.  And poss. Got better but Pt. Is under much stress due to her husband having a stroke last night.  The Pt. Has been up and awake for the entire night.

## 2024-03-27 NOTE — ED Provider Notes (Signed)
 Blood pressure 136/65, pulse 67, temperature 98.1 F (36.7 C), temperature source Oral, resp. rate 18, weight 46.3 kg, SpO2 97%.  Assuming care from Dr. Ruthe.  In short, Savannah Jimenez is a 88 y.o. female with a chief complaint of Weakness .  Refer to the original H&P for additional details.  The current plan of care is to follow up on the CTA head/neck and reassess.  04:43 PM  CTA without acute process.  Plan for close outpatient follow-up with her PCP.  She will continue her anticoagulant and other home medications.    Darra Fonda MATSU, MD 03/27/24 [phone]

## 2024-03-27 NOTE — Discharge Instructions (Addendum)
 Please continue your home medications.  I would like for you to follow-up with your primary care doctor in the coming week to review your ED visit and scan results.

## 2024-04-03 ENCOUNTER — Ambulatory Visit: Admitting: Emergency Medicine

## 2024-04-11 ENCOUNTER — Telehealth: Payer: Self-pay | Admitting: Internal Medicine

## 2024-04-11 NOTE — Telephone Encounter (Signed)
 Requesting: Ambien  5mg   Contract: 12/05/22 UDS: Ambien  only Last Visit: 02/26/24 Next Visit: 05/16/24 Last Refill: 12/12/23 #90 and 0RF  Please Advise

## 2024-04-11 NOTE — Telephone Encounter (Signed)
 PDMP okay, Rx sent

## 2024-05-16 ENCOUNTER — Ambulatory Visit (INDEPENDENT_AMBULATORY_CARE_PROVIDER_SITE_OTHER): Admitting: Internal Medicine

## 2024-05-16 ENCOUNTER — Encounter: Payer: Self-pay | Admitting: Internal Medicine

## 2024-05-16 VITALS — BP 118/76 | HR 67 | Temp 98.1°F | Resp 16 | Ht 63.0 in | Wt 102.0 lb

## 2024-05-16 DIAGNOSIS — I4892 Unspecified atrial flutter: Secondary | ICD-10-CM

## 2024-05-16 DIAGNOSIS — E782 Mixed hyperlipidemia: Secondary | ICD-10-CM | POA: Diagnosis not present

## 2024-05-16 DIAGNOSIS — Z23 Encounter for immunization: Secondary | ICD-10-CM | POA: Diagnosis not present

## 2024-05-16 DIAGNOSIS — E538 Deficiency of other specified B group vitamins: Secondary | ICD-10-CM

## 2024-05-16 DIAGNOSIS — I720 Aneurysm of carotid artery: Secondary | ICD-10-CM

## 2024-05-16 DIAGNOSIS — H532 Diplopia: Secondary | ICD-10-CM

## 2024-05-16 LAB — LIPID PANEL
Cholesterol: 137 mg/dL (ref 0–200)
HDL: 66.2 mg/dL (ref >39.00)
LDL Cholesterol: 52 mg/dL (ref 0–99)
NonHDL: 70.56
Total CHOL/HDL Ratio: 2
Triglycerides: 92 mg/dL (ref 0.0–149.0)
VLDL: 18.4 mg/dL (ref 0.0–40.0)

## 2024-05-16 LAB — B12 AND FOLATE PANEL
Folate: >22.9 ng/mL (ref >5.9)
Vitamin B-12: >1500 pg/mL — ABNORMAL HIGH (ref 211–911)

## 2024-05-16 MED ORDER — METOPROLOL SUCCINATE ER 50 MG PO TB24
25.0000 mg | ORAL_TABLET | Freq: Every day | ORAL | Status: AC
Start: 2024-05-16 — End: ?

## 2024-05-16 NOTE — Patient Instructions (Addendum)
 GO TO THE LAB :  Get the blood work    Then, go to the front desk for the checkout Please make an appointment     Decrease metoprolol  XL 50 mg to only half tablet a day Drink plenty of water If your blood pressure continue to be low let me know Let me know if you have palpitations  Continue checking your blood pressure regularly Blood pressure goal:  between 110/65 and  135/85. If it is consistently higher or lower, let me know  You got a pneumonia shot today. Please consider a COVID-vaccine  Please read more detailed instructions below   DOUBLE VISION (DIPLOPIA): You have been experiencing persistent double vision, especially when looking to the right. This may be related to your previous TIA or the small aneurysm in your right carotid artery. -We will refer you to a neurologist for further evaluation   -Discuss the CT findings with the neurologist.      ATRIAL FIBRILLATION: Your atrial fibrillation is currently controlled with medication. -Continue taking Eliquis  2.5 mg orally twice daily. -Monitor for any signs of low blood pressure or palpitations.  HYPOTENSION: You experienced an episode of low blood pressure, which may be related to your metoprolol  medication. Decrease metoprolol  XL 50 mg to only half tablet a day Drink plenty of water Monitor your blood pressure and symptoms. Report any persistent low blood pressure or new symptoms.  ANXIETY AND DEPRESSION: Your anxiety and depression are stable with your current medications. -Continue taking Buspar  10 mg orally twice daily. -Continue taking Ambien  5 mg orally at bedtime as needed.  HYPERLIPIDEMIA: Your high cholesterol is being managed with atorvastatin . -Continue taking atorvastatin  20 mg orally daily at bedtime. -We will check your cholesterol levels.  VITAMIN B12 DEFICIENCY: You have a vitamin B12 deficiency that is being managed with supplements. -We will check your vitamin B12 levels.

## 2024-05-16 NOTE — Progress Notes (Signed)
 Subjective:    Patient ID: Savannah Jimenez, female    DOB: [birth year], 88 y.o.   MRN: [id number]  DOS:  05/16/2024 Follow-up, here with her son Savannah Jimenez  Discussed the use of AI scribe software for clinical note transcription with the patient, who gave verbal consent to proceed.  History of Present Illness  Diplopia - Persistent double vision, primarily when looking to the right - No diplopia when looking straight ahead - Went to the ER with the above complaint, workup done.  Subsequently ophthalmologist suggested dry eyes as a possible cause, but patient is skeptical - Suspects possible relation to prior TIA  Hypotension and unsteadiness - Experienced an episode of low blood pressure with systolic reading of 75 mmHg - Usual systolic blood pressure is 113-114 mmHg - Episode associated with unsteadiness  Atrial fibrillation and anticoagulation - History of atrial fibrillation - Currently taking metoprolol  50 mg - On Eliquis  for anticoagulation - No recent chest pain, shortness of breath, palpitations, or other cardiac symptoms  General symptoms and preventive care - No recent nausea, blood in stools, or blood in urine - Feels less tired recently - Has not received a COVID vaccine    Review of Systems Denies chest pain or difficulty breathing No nausea vomiting.  No blood in the stools.  No blood in the urine  Past Medical History:  Diagnosis Date   Allergy    Arthritis    BCC (basal cell carcinoma of skin)    GERD (gastroesophageal reflux disease)    Glaucoma suspect    HOH (hard of hearing)    Osteoporosis     Past Surgical History:  Procedure Laterality Date   ABDOMINAL HYSTERECTOMY     still have cervix   ARTERY BIOPSY Right 10/06/2014   Procedure: BIOPSY TEMPORAL ARTERY;  Surgeon: Krystal Spinner, MD;  Location: [facility];  Service: General;  Laterality: Right;   BREAST EXCISIONAL BIOPSY Right    over 20 years ago; lymph nodes removed   CATARACT  EXTRACTION  10/2011   Left   colonscopy  2007   INNER EAR SURGERY  1974   both stapes replaced-steal   TUBAL LIGATION      Current Outpatient Medications  Medication Instructions   albuterol  (VENTOLIN  HFA) 108 (90 Base) MCG/ACT inhaler 2 puffs, Inhalation, Every 6 hours PRN   atorvastatin  (LIPITOR) 20 mg, Oral, Daily at bedtime   busPIRone  (BUSPAR ) 10 mg, Oral, 2 times daily   Cholecalciferol (VITAMIN D ) 50 MCG (2000 UT) CAPS 1 capsule, Daily   Cyanocobalamin  (B-12) 1000 MCG TABS Take by mouth.   ELIQUIS  2.5 MG TABS tablet TAKE 1 TABLET BY MOUTH TWICE  DAILY   fish oil-omega-3 fatty acids  2 g, Daily   guaiFENesin  (MUCINEX ) 1,200 mg, Oral, 2 times daily   ipratropium (ATROVENT  HFA) 17 MCG/ACT inhaler 1 puff, Inhalation, 3 times daily   metoprolol  succinate (TOPROL -XL) 50 mg, Oral, Daily, Take with or immediately following a meal.   Multiple Vitamins-Minerals (PRESERVISION AREDS 2) CAPS 1 capsule, 2 times daily   ondansetron  (ZOFRAN ) 4 mg, Oral, Every 6 hours   zolpidem  (AMBIEN ) 5 mg, Oral, At bedtime PRN       Objective:   Physical Exam BP 118/76   Pulse 67   Temp 98.1 F (36.7 C) (Oral)   Resp 16   Ht 5' 3 (1.6 m)   Wt 102 lb (46.3 kg)   SpO2 95%   BMI 18.07 kg/m  General:   Well developed,  NAD, BMI noted. HEENT:  Normocephalic . Face symmetric, atraumatic.  EOMI Lungs:  CTA B Normal respiratory effort, no intercostal retractions, no accessory muscle use. Heart: RRR,  no murmur.  Lower extremities: no pretibial edema bilaterally  Skin: Not pale. Not jaundice Neurologic:  alert & oriented X3.  Speech normal, gait appropriate for age and unassisted Psych--  Cognition and judgment appear intact.  Cooperative with normal attention span and concentration.  Behavior appropriate. No anxious or depressed appearing.      Assessment     Assessment, transferring from Dr. Esta (01/2018)  Allergies GERD Anxiety depression Atrial fibrillation/flutter Dx  2022 TIA: Dx 2022. Osteoporosis: - rx  actonel  before  -Tscore  -2.2 (10-2017), -2.3 (June 2023), declines treatment as of 03/28/2023.  Aware of risks. Palpable abdominal aorta: Abdominal ultrasound  09/2018 negative for AAA B12 deficiency Pulmonary nodule - sees  pulmonary H/o polymyalgia rheumatica; used to see Dr Everlean, JOHNNA. Bx 2016 Ear implants: No MRI Pectus excavatum-- chronic DOE  Assessment & Plan Double vision (diplopia) Diplopia, went to the ER 03/27/2024, workup included CT angio head and neck.  No acute intracranial findings, mild chronic small vessel ischemia, no large vessel occlusion on the head.   CTA neck: Small fusiform aneurysmal at the R ICA.  See full report. LOV with neurology 07/27/2021 for a TIA.  Dr. Gregg Symptoms are persistent on and off. She saw ophthalmologist and was diagnosed with dry eye, I explained her that that finding does not explain   diplopia. We had a long discussion with the patient, her son and her daughter (over the phone).   Explained that that I recommend to see the neurologist and get an opinion about the persistent diplopia and the small right ICA aneurysm.  They eventually agreed to be referred.  Carotid artery aneurysm, right side Small aneurysm in right carotid artery on CT. No immediate intervention needed.  Referred to neurology Atrial fibrillation Atrial fibrillation controlled with anticoagulation and rate management. . - Continue Eliquis  2.5 mg orally twice daily. - Decrease metoprolol  dose to XL 25 mg due to low blood pressure. Hypotension Intermittent hypotension, possibly from metoprolol , decrease dose. Monitor blood pressure and symptoms. Report persistent hypotension  Anxiety and depression Anxiety and depression stable with Buspar  and Ambien .  No change.   Hyperlipidemia- Continue atorvastatin  20 mg orally daily at bedtime.  Check FLP Vitamin B12 deficiency managed with supplementation. Levels not recently checked.   Labs Pulmonary nodules: last CT chest August 2025, waxing and waning inflammatory changes, will follow-up with pulmonary Social: loss husband 1 03/28/2024.  Emotionally doing okay General Health Maintenance Had a flu shot, PNM 20 today, recommend to consider COVID booster RTC 6 months, declined need to come back sooner.     Time spent 40 minutes.  To the ER with diplopia, ER notes reviewed, workup reviewed, we had a long discussion with the patient and her children regarding further steps.  Eventually agreed to see neurology

## 2024-05-16 NOTE — Assessment & Plan Note (Signed)
 Double vision (diplopia) Diplopia, went to the ER 03/27/2024, workup included CT angio head and neck.  No acute intracranial findings, mild chronic small vessel ischemia, no large vessel occlusion on the head.   CTA neck: Small fusiform aneurysmal at the R ICA.  See full report. LOV with neurology 07/27/2021 for a TIA.  Dr. Gregg Symptoms are persistent on and off. She saw ophthalmologist and was diagnosed with dry eye, I explained her that that finding does not explain   diplopia. We had a long discussion with the patient, her son and her daughter (over the phone).   Explained that that I recommend to see the neurologist and get an opinion about the persistent diplopia and the small right ICA aneurysm.  They eventually agreed to be referred.  Carotid artery aneurysm, right side Small aneurysm in right carotid artery on CT. No immediate intervention needed.  Referred to neurology Atrial fibrillation Atrial fibrillation controlled with anticoagulation and rate management. . - Continue Eliquis  2.5 mg orally twice daily. - Decrease metoprolol  dose to XL 25 mg due to low blood pressure. Hypotension Intermittent hypotension, possibly from metoprolol , decrease dose. Monitor blood pressure and symptoms. Report persistent hypotension  Anxiety and depression Anxiety and depression stable with Buspar  and Ambien .  No change.   Hyperlipidemia- Continue atorvastatin  20 mg orally daily at bedtime.  Check FLP Vitamin B12 deficiency managed with supplementation. Levels not recently checked.  Labs Pulmonary nodules: last CT chest August 2025, waxing and waning inflammatory changes, will follow-up with pulmonary Social: loss husband 1 03/28/2024.  Emotionally doing okay General Health Maintenance Had a flu shot, PNM 20 today, recommend to consider COVID booster RTC 6 months, declined need to come back sooner.

## 2024-05-18 ENCOUNTER — Ambulatory Visit: Payer: Self-pay | Admitting: Internal Medicine

## 2024-07-07 ENCOUNTER — Other Ambulatory Visit: Payer: Self-pay | Admitting: Cardiovascular Disease

## 2024-07-07 ENCOUNTER — Other Ambulatory Visit: Payer: Self-pay | Admitting: Internal Medicine

## 2024-07-07 NOTE — Telephone Encounter (Signed)
 Requesting: Ambien  5mg  Contract: 12/05/22 UDS:Ambien  only Last Visit:05/16/24 Next Visit:11/14/24 Last Refill: 04/11/24 #90 and 0RF   Please Advise

## 2024-07-29 ENCOUNTER — Ambulatory Visit: Admitting: Neurology

## 2024-07-29 ENCOUNTER — Encounter: Payer: Self-pay | Admitting: Neurology

## 2024-07-29 VITALS — BP 99/62 | HR 85 | Ht 63.0 in | Wt 107.0 lb

## 2024-07-29 DIAGNOSIS — I720 Aneurysm of carotid artery: Secondary | ICD-10-CM | POA: Diagnosis not present

## 2024-07-29 DIAGNOSIS — H532 Diplopia: Secondary | ICD-10-CM

## 2024-07-29 NOTE — Patient Instructions (Signed)
 Continue current medications Will repeat CTA in a year (Around September 2026) Follow up in a year or sooner if worse

## 2024-07-29 NOTE — Progress Notes (Signed)
 "   [facility]  PATIENT: Savannah Jimenez DOB: Mar 11, 1936  REQUESTING CLINICIAN: Amon Aloysius BRAVO, MD HISTORY FROM: Patient and daughter  REASON FOR VISIT: ICA Aneurysm    HISTORICAL  CHIEF COMPLAINT:  Chief Complaint  Patient presents with   New Patient (Initial Visit)    Room 12 With daughter eferral for diplopia    HISTORY OF PRESENT ILLNESS:  Discussed the use of AI scribe software for clinical note transcription with the patient, who gave verbal consent to proceed.  History of Present Illness   Savannah Jimenez is an 89 year old female with a history of history of TIA who presents with double vision and concerns about a newly discovered ICA aneurysm. She is accompanied by her daughter, [name]. She was referred by Dr. Amon for evaluation of her double vision and aneurysm.  She has a history of transient ischemic attacks and is unable to undergo MRI due to metal in her ears. She was previously evaluated two years ago. She is on Eliquis  for stroke prevention.  In September, she experienced an episode of double vision while driving, which was different from her usual experience of double vision that occurs only when looking to the right. This episode prompted a visit to the ER, where a CT scan was performed, but no acute findings were noted. She was advised to follow up with her primary care physician, who then referred her back to neurology.  During the ER visit, a CT angiogram revealed a 5 mm aneurysm in the internal carotid artery, which was not previously identified. This finding has raised concerns for her and her family.  Her double vision is typically present when looking straight ahead, particularly in one quadrant on the right, and it resolves when she turns her head. She does not experience double vision when looking to the side. The double vision was noted to be worse during the September episode, with lines on the road appearing double, but this resolved after the  episode.  Her family history is significant for her husband having had a stroke in September, which was unexpected and led to her initial ER visit for double vision.  She resides in a senior living center, is active in a walking club three days a week, and continues to drive. Her blood pressure is consistently low.    OTHER MEDICAL CONDITIONS: Atrial fibrillation, Anxiety, hyperlipidemia, Insomnia    REVIEW OF SYSTEMS: Full 14 system review of systems performed and negative with exception of: As noted in the HPI   ALLERGIES: Allergies[1]  HOME MEDICATIONS: Outpatient Medications Prior to Visit  Medication Sig Dispense Refill   albuterol  (VENTOLIN  HFA) 108 (90 Base) MCG/ACT inhaler Inhale 2 puffs into the lungs every 6 (six) hours as needed for wheezing or shortness of breath. 8 g 2   apixaban  (ELIQUIS ) 2.5 MG TABS tablet Take 1 tablet (2.5 mg total) by mouth 2 (two) times daily. 180 tablet 3   atorvastatin  (LIPITOR) 20 MG tablet Take 1 tablet (20 mg total) by mouth at bedtime. 90 tablet 1   busPIRone  (BUSPAR ) 10 MG tablet Take 1 tablet (10 mg total) by mouth 2 (two) times daily. 180 tablet 1   Cholecalciferol (VITAMIN D ) 50 MCG (2000 UT) CAPS Take 1 capsule by mouth daily.     Cyanocobalamin  (B-12) 1000 MCG TABS Take by mouth.     fish oil-omega-3 fatty acids  1000 MG capsule Take 2 g by mouth daily.     guaiFENesin  (MUCINEX ) 600  MG 12 hr tablet Take 2 tablets (1,200 mg total) by mouth 2 (two) times daily. 120 tablet 0   ipratropium (ATROVENT  HFA) 17 MCG/ACT inhaler Inhale 1 puff into the lungs 3 (three) times daily. 1 each 2   metoprolol  succinate (TOPROL -XL) 50 MG 24 hr tablet Take 0.5 tablets (25 mg total) by mouth daily. Take with or immediately following a meal.     Multiple Vitamins-Minerals (PRESERVISION AREDS 2) CAPS Take 1 capsule by mouth 2 (two) times daily.      ondansetron  (ZOFRAN ) 4 MG tablet Take 1 tablet (4 mg total) by mouth every 6 (six) hours. 12 tablet 0   zolpidem   (AMBIEN ) 5 MG tablet TAKE 1 TABLET BY MOUTH AT  BEDTIME AS NEEDED 90 tablet 0   No facility-administered medications prior to visit.    PAST MEDICAL HISTORY: Past Medical History:  Diagnosis Date   Allergy    Arthritis    BCC (basal cell carcinoma of skin)    GERD (gastroesophageal reflux disease)    Glaucoma suspect    HOH (hard of hearing)    Osteoporosis     PAST SURGICAL HISTORY: Past Surgical History:  Procedure Laterality Date   ABDOMINAL HYSTERECTOMY     still have cervix   ARTERY BIOPSY Right 10/06/2014   Procedure: BIOPSY TEMPORAL ARTERY;  Surgeon: Krystal Spinner, MD;  Location: [facility];  Service: General;  Laterality: Right;   BREAST EXCISIONAL BIOPSY Right    over 20 years ago; lymph nodes removed   CATARACT EXTRACTION  10/2011   Left   colonscopy  2007   INNER EAR SURGERY  1974   both stapes replaced-steal   TUBAL LIGATION      FAMILY HISTORY: Family History  Problem Relation Age of Onset   Heart failure Mother    Hypertension Sister    Asthma Sister    Hypertension Brother     SOCIAL HISTORY: Social History   Socioeconomic History   Marital status: Widowed    Spouse name: Not on file   Number of children: 2   Years of education: Not on file   Highest education level: 12th grade  Occupational History   Occupation: retired , day care  Tobacco Use   Smoking status: Never   Smokeless tobacco: Never  Vaping Use   Vaping status: Never Used  Substance and Sexual Activity   Alcohol use: Not Currently   Drug use: No   Sexual activity: Not on file  Other Topics Concern   Not on file  Social History Narrative   Household: pt and husband       2 g- children   2 g-g- children   Social Drivers of Health   Tobacco Use: Low Risk (07/29/2024)   Patient History    Smoking Tobacco Use: Never    Smokeless Tobacco Use: Never    Passive Exposure: Not on file  Financial Resource Strain: Low Risk (05/09/2024)   Overall Financial Resource  Strain (CARDIA)    Difficulty of Paying Living Expenses: Not hard at all  Food Insecurity: No Food Insecurity (05/09/2024)   Epic    Worried About Programme Researcher, Broadcasting/film/video in the Last Year: Never true    Ran Out of Food in the Last Year: Never true  Transportation Needs: No Transportation Needs (05/09/2024)   Epic    Lack of Transportation (Medical): No    Lack of Transportation (Non-Medical): No  Physical Activity: Insufficiently Active (05/09/2024)   Exercise Vital Sign  Days of Exercise per Week: 3 days    Minutes of Exercise per Session: 20 min  Stress: No Stress Concern Present (05/09/2024)   [employer] of Occupational Health - Occupational Stress Questionnaire    Feeling of Stress: Only a little  Social Connections: Moderately Integrated (05/09/2024)   Social Connection and Isolation Panel    Frequency of Communication with Friends and Family: More than three times a week    Frequency of Social Gatherings with Friends and Family: Once a week    Attends Religious Services: More than 4 times per year    Active Member of [employer] or Organizations: Yes    Attends Banker Meetings: More than 4 times per year    Marital Status: Widowed  Intimate Partner Violence: Not At Risk (12/20/2023)   Humiliation, Afraid, Rape, and Kick questionnaire    Fear of Current or Ex-Partner: No    Emotionally Abused: No    Physically Abused: No    Sexually Abused: No  Depression (PHQ2-9): Low Risk (05/16/2024)   Depression (PHQ2-9)    PHQ-2 Score: 4  Alcohol Screen: Low Risk (12/20/2023)   Alcohol Screen    Last Alcohol Screening Score (AUDIT): 0  Housing: Low Risk (05/09/2024)   Epic    Unable to Pay for Housing in the Last Year: No    Number of Times Moved in the Last Year: 1    Homeless in the Last Year: No  Utilities: Not At Risk (12/20/2023)   [facility] Utilities    Threatened with loss of utilities: No  Health Literacy: Adequate Health Literacy (12/20/2023)   B1300 Health Literacy     Frequency of need for help with medical instructions: Never    PHYSICAL EXAM  GENERAL EXAM/CONSTITUTIONAL: Vitals:  Vitals:   07/29/24 1349  BP: 99/62  Pulse: 85  SpO2: 93%  Weight: 107 lb (48.5 kg)  Height: 5' 3 (1.6 m)   Body mass index is 18.95 kg/m. Wt Readings from Last 3 Encounters:  07/29/24 107 lb (48.5 kg)  05/16/24 102 lb (46.3 kg)  03/27/24 102 lb (46.3 kg)   Patient is in no distress; well developed, nourished and groomed; neck is supple  MUSCULOSKELETAL: Gait, strength, tone, movements noted in Neurologic exam below  NEUROLOGIC: MENTAL STATUS:      No data to display         awake, alert, oriented to person, place and time recent and remote memory intact normal attention and concentration language fluent, comprehension intact, naming intact fund of knowledge appropriate  CRANIAL NERVE:  2nd, 3rd, 4th, 6th - Visual fields full to confrontation, extraocular muscles intact, no nystagmus 5th - facial sensation symmetric 7th - facial strength symmetric 8th - hearing intact 9th - palate elevates symmetrically, uvula midline 11th - shoulder shrug symmetric 12th - tongue protrusion midline  MOTOR:  normal bulk and tone, full strength in the BUE, BLE  SENSORY:  normal and symmetric to light touch  COORDINATION:  finger-nose-finger, fine finger movements normal  GAIT/STATION:  normal    DIAGNOSTIC DATA (LABS, IMAGING, TESTING) - I reviewed patient records, labs, notes, testing and imaging myself where available.  Lab Results  Component Value Date   WBC 4.8 03/27/2024   HGB 11.6 (L) 03/27/2024   HCT 35.7 (L) 03/27/2024   MCV 94.4 03/27/2024   PLT 198 03/27/2024      Component Value Date/Time   NA 139 03/27/2024 1410   NA 143 08/11/2021 1445   K 4.1 03/27/2024 1410  CL 102 03/27/2024 1410   CO2 26 03/27/2024 1410   GLUCOSE 85 03/27/2024 1410   BUN 20 03/27/2024 1410   BUN 20 08/11/2021 1445   CREATININE 0.63 03/27/2024 1410    CALCIUM  9.3 03/27/2024 1410   PROT 7.0 03/27/2024 1410   ALBUMIN 4.3 03/27/2024 1410   AST 33 03/27/2024 1410   ALT 22 03/27/2024 1410   ALKPHOS 65 03/27/2024 1410   BILITOT 0.7 03/27/2024 1410   GFRNONAA >60 03/27/2024 1410   GFRAA 107 04/17/2008 1013   Lab Results  Component Value Date   CHOL 137 05/16/2024   HDL 66.20 05/16/2024   LDLCALC 52 05/16/2024   LDLDIRECT 143.5 05/31/2012   TRIG 92.0 05/16/2024   CHOLHDL 2 05/16/2024   No results found for: HGBA1C Lab Results  Component Value Date   VITAMINB12 >1500 (H) 05/16/2024   Lab Results  Component Value Date   TSH 2.34 01/15/2024    Non-contrast head CT 03/27/2024: 1.  No evidence of an acute intracranial abnormality. 2. Mild chronic small vessel ischemic changes within the cerebral white matter. 3. Mild generalized cerebral atrophy.   CTA neck 03/27/2024: 1. The common carotid and internal carotid arteries are patent within the neck without stenosis. Mild atherosclerotic plaque bilaterally, as described. 2. Mild focal fusiform dilation of the mid cervical ICA (with the vessel measuring up to 5 mm in diameter at this site), potentially reflecting a small fusiform aneurysm. 3. 3 mm infundibulum arising from the left vertebral artery distal V2 segment. 4. Aortic Atherosclerosis (ICD10-I70.0).   CTA head 03/27/2024: 1. No proximal intracranial large vessel occlusion or high-grade proximal arterial stenosis identified. 2. Non-stenotic atherosclerotic plaque within the intracranial internal carotid arteries. 3. Short-segment fenestration within the proximal basilar artery (anatomic variant).    ASSESSMENT AND PLAN  89 y.o. year old female with history of TIA, atrial fibrillation on Eliquis , Anxiety who presents after an episode of double vision and her scan showing right ICA aneurysm   Carotid artery aneurysm A 5 mm aneurysm in the internal carotid artery was identified via CT angiogram in September. It is small and  asymptomatic, with no immediate intervention required. The risk of rupture is low, but monitoring is necessary to detect growth. Blood pressure control is crucial to prevent aneurysm enlargement. - Will order repeat CT angiogram in August 2026 to monitor aneurysm size. - Ensure blood pressure remains controlled to prevent aneurysm growth.  Diplopia Chronic diplopia, primarily affecting one quadrant, with a transient episode in September involving double vision of road lines. He diplopia is likely relate to eye muscle weakness. No new neurological deficits have developed since the initial episode. Current management includes monitoring and ensuring proper stroke prevention medication. - Continue current stroke prevention medication (Eliquis ). - Advised to seek immediate medical attention if new or worsening diplopia occurs.     1. Diplopia   2. Aneurysm artery, neck      Patient Instructions  Continue current medications Will repeat CTA in a year (Around September 2026) Follow up in a year or sooner if worse    No orders of the defined types were placed in this encounter.   No orders of the defined types were placed in this encounter.   Return in about 1 year (around 07/29/2025).    Pastor Falling, MD 07/29/2024, 2:32 PM  [facility] [address] [place], [place] [address] [phone]     [1]  Allergies Allergen Reactions   Tetanus Toxoid-Containing Vaccines  Arm swelling    Penicillins     PCN reaction: big red spot with shot in high school.  No s/s anaphylaxis.  Tolerated oral cephalosporin in 2013.   Sulfacetamide Sodium    Sulfamethoxazole-Trimethoprim Other (See Comments)   "

## 2024-08-20 ENCOUNTER — Other Ambulatory Visit: Payer: Self-pay | Admitting: Internal Medicine

## 2024-08-20 MED ORDER — ATORVASTATIN CALCIUM 20 MG PO TABS: 20.0000 mg | ORAL_TABLET | Freq: Every day | ORAL | 1 refills | Status: AC

## 2024-08-20 NOTE — Telephone Encounter (Unsigned)
 Copied from CRM #[id number]. Topic: Clinical - Prescription Issue >> Aug 20, 2024  4:13 PM Delon T wrote: Reason for CRM: atorvastatin  (LIPITOR) 20 MG tablet - need sent to [facility] on [address]- 90 day supply- [facility] wont get it there in time for her was told to  have it sent to local pharmacy- will only have a way to get it tomorrow

## 2024-08-25 ENCOUNTER — Ambulatory Visit (HOSPITAL_BASED_OUTPATIENT_CLINIC_OR_DEPARTMENT_OTHER)

## 2024-09-01 ENCOUNTER — Ambulatory Visit (HOSPITAL_BASED_OUTPATIENT_CLINIC_OR_DEPARTMENT_OTHER)

## 2024-09-10 ENCOUNTER — Ambulatory Visit: Admitting: Emergency Medicine

## 2024-11-14 ENCOUNTER — Ambulatory Visit: Admitting: Internal Medicine

## 2024-12-03 ENCOUNTER — Ambulatory Visit: Admitting: Cardiovascular Disease

## 2024-12-08 ENCOUNTER — Encounter (INDEPENDENT_AMBULATORY_CARE_PROVIDER_SITE_OTHER): Admitting: Ophthalmology

## 2024-12-23 ENCOUNTER — Ambulatory Visit

## 2025-08-03 ENCOUNTER — Ambulatory Visit: Admitting: Neurology
